# Patient Record
Sex: Male | Born: 1980
Health system: Southern US, Community
[De-identification: ages and names within clinical notes are randomized; demographics above are authoritative.]

## PROBLEM LIST (undated history)

## (undated) DIAGNOSIS — F329 Major depressive disorder, single episode, unspecified: Secondary | ICD-10-CM

## (undated) DIAGNOSIS — K3184 Gastroparesis: Secondary | ICD-10-CM

## (undated) DIAGNOSIS — F32A Depression, unspecified: Secondary | ICD-10-CM

## (undated) DIAGNOSIS — F419 Anxiety disorder, unspecified: Secondary | ICD-10-CM

## (undated) DIAGNOSIS — K219 Gastro-esophageal reflux disease without esophagitis: Secondary | ICD-10-CM

## (undated) HISTORY — DX: Major depressive disorder, single episode, unspecified: F32.9

## (undated) HISTORY — PX: OTHER SURGICAL HISTORY: SHX169

## (undated) HISTORY — DX: Anxiety disorder, unspecified: F41.9

## (undated) HISTORY — PX: ESOPHAGOGASTRODUODENOSCOPY: SHX1529

## (undated) HISTORY — DX: Depression, unspecified: F32.A

---

## 2006-06-05 ENCOUNTER — Emergency Department (HOSPITAL_COMMUNITY): Admission: EM | Admit: 2006-06-05 | Discharge: 2006-06-05 | Payer: Self-pay | Admitting: Emergency Medicine

## 2008-01-06 ENCOUNTER — Emergency Department (HOSPITAL_COMMUNITY): Admission: EM | Admit: 2008-01-06 | Discharge: 2008-01-06 | Payer: Self-pay | Admitting: Emergency Medicine

## 2011-05-05 ENCOUNTER — Encounter (HOSPITAL_COMMUNITY): Payer: Self-pay

## 2011-05-05 ENCOUNTER — Emergency Department (HOSPITAL_COMMUNITY): Payer: Self-pay

## 2011-05-05 ENCOUNTER — Emergency Department (HOSPITAL_COMMUNITY)
Admission: EM | Admit: 2011-05-05 | Discharge: 2011-05-05 | Disposition: A | Discharge status: Home / Self Care | Payer: Self-pay | Attending: Emergency Medicine | Admitting: Emergency Medicine

## 2011-05-05 DIAGNOSIS — M7989 Other specified soft tissue disorders: Secondary | ICD-10-CM | POA: Insufficient documentation

## 2011-05-05 DIAGNOSIS — S8990XA Unspecified injury of unspecified lower leg, initial encounter: Secondary | ICD-10-CM | POA: Insufficient documentation

## 2011-05-05 DIAGNOSIS — W219XXA Striking against or struck by unspecified sports equipment, initial encounter: Secondary | ICD-10-CM | POA: Insufficient documentation

## 2011-05-05 DIAGNOSIS — M25569 Pain in unspecified knee: Secondary | ICD-10-CM | POA: Insufficient documentation

## 2011-05-05 DIAGNOSIS — Y9361 Activity, american tackle football: Secondary | ICD-10-CM | POA: Insufficient documentation

## 2011-05-05 DIAGNOSIS — F17200 Nicotine dependence, unspecified, uncomplicated: Secondary | ICD-10-CM | POA: Insufficient documentation

## 2011-05-05 MED ORDER — IBUPROFEN 800 MG PO TABS
800.0000 mg | ORAL_TABLET | Freq: Once | ORAL | Status: AC
  Administered 2011-05-05: 800 mg via ORAL
  Filled 2011-05-05: qty 1

## 2011-05-05 NOTE — ED Provider Notes (Signed)
History     CSN: [id number]  Arrival date & time 05/05/11  1531   First MD Initiated Contact with Patient 05/05/11 1604      Chief Complaint  Patient presents with  . Knee Pain    (Consider location/radiation/quality/duration/timing/severity/associated sxs/prior treatment) HPI Comments: Patient presents with left knee pain that began today.  He notes that last Saturday he was in a football game and his knee was hit by a helmet.  At that time he didn't have significant pain and has been able to ambulate on his leg all week.  Starting yesterday he started having some increasing pain and swelling and it worsened today which is why he comes in.  He notes prior injury similar to this but has never had any orthopedic evaluation or prior procedures on that knee.  He has no fevers, redness, warmth or crepitus to the joint.  He has some worsening pain with flexion but he can flex and extend his knee joint.  Patient is a 31 y.o. male presenting with knee pain. The history is provided by the patient. No language interpreter was used.  Knee Pain This is a new problem. Pertinent negatives include no chest pain, no abdominal pain, no headaches and no shortness of breath. The symptoms are aggravated by bending.    History reviewed. No pertinent past medical history.  History reviewed. No pertinent past surgical history.  No family history on file.  History  Substance Use Topics  . Smoking status: Current Everyday Smoker -- 0.5 packs/day  . Smokeless tobacco: Not on file  . Alcohol Use: Yes      Review of Systems  Constitutional: Negative.  Negative for fever and chills.  HENT: Negative.   Eyes: Negative.  Negative for discharge and redness.  Respiratory: Negative.  Negative for cough and shortness of breath.   Cardiovascular: Negative.  Negative for chest pain.  Gastrointestinal: Negative.  Negative for nausea, vomiting and abdominal pain.  Genitourinary: Negative.  Negative for  hematuria.  Musculoskeletal: Positive for joint swelling. Negative for back pain.  Skin: Negative.  Negative for color change and rash.  Neurological: Negative for syncope and headaches.  Hematological: Negative.  Negative for adenopathy.  Psychiatric/Behavioral: Negative.  Negative for confusion.  All other systems reviewed and are negative.    Allergies  Review of patient's allergies indicates no known allergies.  Home Medications  No current outpatient prescriptions on file.  BP 149/89  Pulse 80  Temp(Src) 98.1 F (36.7 C) (Oral)  Resp 16  SpO2 98%  Physical Exam  Nursing note and vitals reviewed. Constitutional: He is oriented to person, place, and time. He appears well-developed and well-nourished.  Non-toxic appearance. He does not have a sickly appearance.  HENT:  Head: Normocephalic and atraumatic.  Eyes: Conjunctivae, EOM and lids are normal. Pupils are equal, round, and reactive to light.  Neck: Trachea normal, normal range of motion and full passive range of motion without pain. Neck supple.  Cardiovascular: Normal rate.   Pulmonary/Chest: Effort normal.  Abdominal: Soft. Normal appearance. There is no CVA tenderness.  Musculoskeletal: He exhibits edema.       Patient has mild swelling to the left knee.  No erythema, crepitus or warmth.  Patient can flex and extend his joint.  He does have pain with flexion and it is difficult to check for anterior or posterior drawer sign due to pain.  Neurological: He is alert and oriented to person, place, and time. He has normal strength.  Skin:  Skin is warm, dry and intact. No rash noted.  Psychiatric: He has a normal mood and affect. His behavior is normal. Judgment and thought content normal.    ED Course  Procedures (including critical care time)  Labs Reviewed - No data to display Dg Knee 2 Views Left  05/05/2011  *RADIOLOGY REPORT*  Clinical Data: Pain.  LEFT KNEE - 1-2 VIEW  Comparison: Four views left knee  01/06/2008.  Findings: No acute bony or joint abnormality is identified. Again seen is tricompartmental degenerative disease without marked change.  No joint effusion is seen.  IMPRESSION: No acute finding.  Tricompartmental degenerative disease.  Original Report Authenticated By: Bernadene Bell. Maricela Curet, M.D.     No diagnosis found.    MDM  Patient with possible soft tissue injury given the inherent in the knee with a helmet.  Patient also has degenerative disease and his knee despite his young age.  I will place him in a knee immobilizer and given crutches to be used as needed.  I will recommend orthopedic followup for further evaluation of this.  At this point in time I believe the patient can safely bear weight as tolerated.  He shows no signs of infection as there is no redness, warmth or crepitus at the wound site and the patient is without fevers        Nat Christen, MD 05/05/11 1623

## 2011-05-05 NOTE — ED Notes (Signed)
Pt presents with 6 day h/o L knee pain.  Pt reports during football practice, he was hit in knee with helmet, but reports swelling began yesterday.

## 2012-03-02 ENCOUNTER — Emergency Department (HOSPITAL_COMMUNITY)
Admission: EM | Admit: 2012-03-02 | Discharge: 2012-03-02 | Disposition: A | Discharge status: Home / Self Care | Payer: Self-pay | Attending: Emergency Medicine | Admitting: Emergency Medicine

## 2012-03-02 ENCOUNTER — Encounter (HOSPITAL_COMMUNITY): Payer: Self-pay | Admitting: *Deleted

## 2012-03-02 DIAGNOSIS — Z202 Contact with and (suspected) exposure to infections with a predominantly sexual mode of transmission: Secondary | ICD-10-CM | POA: Insufficient documentation

## 2012-03-02 DIAGNOSIS — F17200 Nicotine dependence, unspecified, uncomplicated: Secondary | ICD-10-CM | POA: Insufficient documentation

## 2012-03-02 MED ORDER — METRONIDAZOLE 500 MG PO TABS: 500.0000 mg | ORAL_TABLET | Freq: Two times a day (BID) | ORAL | Status: DC

## 2012-03-02 MED ORDER — AZITHROMYCIN 250 MG PO TABS
1000.0000 mg | ORAL_TABLET | Freq: Once | ORAL | Status: AC
  Administered 2012-03-02: 1000 mg via ORAL
  Filled 2012-03-02: qty 4

## 2012-03-02 MED ORDER — CEFTRIAXONE SODIUM 250 MG IJ SOLR
250.0000 mg | Freq: Once | INTRAMUSCULAR | Status: AC
  Administered 2012-03-02: 250 mg via INTRAMUSCULAR
  Filled 2012-03-02: qty 250

## 2012-03-02 MED ORDER — LIDOCAINE HCL (PF) 1 % IJ SOLN
INTRAMUSCULAR | Status: AC
  Administered 2012-03-02: 0.9 mL
  Filled 2012-03-02: qty 5

## 2012-03-02 NOTE — ED Provider Notes (Signed)
History   This chart was scribed for [employer]. Rubin Payor, MD, by Marcina Millard scribe. The patient was seen in room TR04C/TR04C and the patient's care was started at 1845.    CSN: [id number]  Arrival date & time 03/02/12  1831   First MD Initiated Contact with Patient 03/02/12 1845      Chief Complaint  Patient presents with  . Exposure to STD    (Consider location/radiation/quality/duration/timing/severity/associated sxs/prior treatment) HPI Comments: Preston [name] is a 31 y.o. male who presents to the Emergency Department complaining of exposure to an STD 3 days ago. He states that he had a new partner 3 days ago that called him today that she tested positive for trichomoniasis. He denies any discharge or burning. He has no h/o of STDs. He denies any allergies to medication.     History reviewed. No pertinent past medical history.  History reviewed. No pertinent past surgical history.  No family history on file.  History  Substance Use Topics  . Smoking status: Current Every Day Smoker -- 0.5 packs/day  . Smokeless tobacco: Not on file  . Alcohol Use: Yes      Review of Systems  All other systems reviewed and are negative.    Allergies  Red dye and Yellow dyes (non-tartrazine)  Home Medications   Current Outpatient Rx  Name  Route  Sig  Dispense  Refill  . IBUPROFEN 200 MG PO TABS   Oral   Take 400 mg by mouth every 6 (six) hours as needed. For pain         . METRONIDAZOLE 500 MG PO TABS   Oral   Take 1 tablet (500 mg total) by mouth 2 (two) times daily.   14 tablet   0     BP 125/72  Pulse 78  Temp 98.2 F (36.8 C)  Resp 18  SpO2 99%  Physical Exam  Nursing note and vitals reviewed. Constitutional: He is oriented to person, place, and time. He appears well-developed and well-nourished. No distress.  HENT:  Head: Normocephalic and atraumatic.  Eyes: EOM are normal.  Neck: No tracheal deviation present.  Pulmonary/Chest: No  respiratory distress.  Abdominal: He exhibits no distension.  Genitourinary: No penile tenderness.       No penile discharge  Musculoskeletal: Normal range of motion. He exhibits no edema.  Neurological: He is alert and oriented to person, place, and time.  Skin: Skin is warm and dry.  Psychiatric: He has a normal mood and affect. His behavior is normal.    ED Course  Procedures (including critical care time)   COORDINATION OF CARE:  18:50- Discussed planned course of treatment with the patient, including STD testing and antibiotics, who is agreeable at this time.  19:15- Medication Orders- ceftriaxone (ROCEPHIN) injection 250 mg- Once, anzithromycin (ZITHROMAX) tablet 1,000- Once.   Labs Reviewed  RPR  GC/CHLAMYDIA PROBE AMP  HIV ANTIBODY (ROUTINE TESTING)   No results found.   1. Exposure to STD       MDM  Patient was told by sexual partner that she has Trichomonas. Patient be treated and has been swabbed. Also HIV and RPR were sent.  I personally performed the services described in this documentation, which was scribed in my presence. The recorded information has been reviewed and is accurate.         Juliet Rude. Rubin Payor, MD 03/02/12 1928

## 2012-03-02 NOTE — ED Notes (Signed)
The pt had sex 3 days ago with a male that called him today and told him she was being treated for an std

## 2012-03-02 NOTE — ED Notes (Signed)
He has no symptoms 

## 2012-03-02 NOTE — ED Notes (Signed)
Patient is alert and orientedx4.  Patient was explained discharge instructions and he did not have any questions. 

## 2012-03-03 LAB — SYPHILIS: RPR W/REFLEX TO RPR TITER AND TREPONEMAL ANTIBODIES, TRADITIONAL SCREENING AND DIAGNOSIS ALGORITHM: RPR Ser Ql: NONREACTIVE

## 2012-03-03 LAB — HIV ANTIBODY (ROUTINE TESTING W REFLEX): HIV: NONREACTIVE

## 2012-03-03 LAB — GC/CHLAMYDIA PROBE AMP
CT Probe RNA: NEGATIVE
GC Probe RNA: NEGATIVE

## 2012-08-29 ENCOUNTER — Emergency Department (HOSPITAL_COMMUNITY): Payer: Self-pay

## 2012-08-29 ENCOUNTER — Encounter (HOSPITAL_COMMUNITY): Payer: Self-pay | Admitting: Emergency Medicine

## 2012-08-29 ENCOUNTER — Emergency Department (HOSPITAL_COMMUNITY)
Admission: EM | Admit: 2012-08-29 | Discharge: 2012-08-29 | Disposition: A | Discharge status: Home / Self Care | Payer: Self-pay | Attending: Emergency Medicine | Admitting: Emergency Medicine

## 2012-08-29 DIAGNOSIS — M25571 Pain in right ankle and joints of right foot: Secondary | ICD-10-CM

## 2012-08-29 DIAGNOSIS — M25473 Effusion, unspecified ankle: Secondary | ICD-10-CM | POA: Insufficient documentation

## 2012-08-29 DIAGNOSIS — Z87891 Personal history of nicotine dependence: Secondary | ICD-10-CM | POA: Insufficient documentation

## 2012-08-29 DIAGNOSIS — M7580 Other shoulder lesions, unspecified shoulder: Secondary | ICD-10-CM

## 2012-08-29 DIAGNOSIS — M25579 Pain in unspecified ankle and joints of unspecified foot: Secondary | ICD-10-CM | POA: Insufficient documentation

## 2012-08-29 DIAGNOSIS — M25476 Effusion, unspecified foot: Secondary | ICD-10-CM | POA: Insufficient documentation

## 2012-08-29 DIAGNOSIS — M7730 Calcaneal spur, unspecified foot: Secondary | ICD-10-CM | POA: Insufficient documentation

## 2012-08-29 NOTE — ED Notes (Signed)
Pt here for right ankle pain upon waking this am; pt sts no known injury; pt sts some swelling; pt sts old injury in leg

## 2012-08-29 NOTE — ED Notes (Signed)
Pt states he did not injure ankle, pain began after being at work.

## 2012-08-29 NOTE — ED Notes (Signed)
Erin PA at bedside 

## 2012-08-29 NOTE — ED Provider Notes (Signed)
History    This chart was scribed for non-physician practitioner Junius Finner working with Celene Kras, MD by Quintella Reichert, ED Scribe. This patient was seen in room TR09C/TR09C and the patient's care was started at 4:01 PM .   CSN: [id number]  Arrival date & time 08/29/12  1341       Chief Complaint  Patient presents with  . Ankle Pain     The history is provided by the patient. No language interpreter was used.    HPI Comments: Preston [name] is a 32 y.o. male who presents to the Emergency Department complaining of right ankle pain that began last night and became much more severe on waking this morning, with accompanying mild swelling.  Pt states he worked at his [employer] job yesterday but denies any recent injuries or any other unusual recent activities to bring on pain.  He rates pain as 9/10 and describes it as achy and sharp.  He states he took Aleve, which relieved pain somewhat.  Pt notes he sprained that ankle several years ago while playing football, and has had intermittent episodes of moderate pain since then.  Pt denies h/o gout or any other medical issues.   History reviewed. No pertinent past medical history.  History reviewed. No pertinent past surgical history.  History reviewed. No pertinent family history.  History  Substance Use Topics  . Smoking status: Former Smoker -- 0.50 packs/day  . Smokeless tobacco: Not on file  . Alcohol Use: Yes      Review of Systems  Musculoskeletal: Positive for joint swelling (Right ankle).  All other systems reviewed and are negative.    Allergies  Red dye and Yellow dyes (non-tartrazine)  Home Medications  No current outpatient prescriptions on file.  BP 158/82  Pulse 98  Temp(Src) 98.6 F (37 C) (Oral)  SpO2 96%  Physical Exam  Nursing note and vitals reviewed. Constitutional: He is oriented to person, place, and time. He appears well-developed and well-nourished. No distress.  HENT:  Head:  Normocephalic and atraumatic.  Eyes: Conjunctivae and EOM are normal. Pupils are equal, round, and reactive to light.  Neck: Normal range of motion. Neck supple. No tracheal deviation present.  Cardiovascular: Normal rate, regular rhythm and normal heart sounds.   No murmur heard. Pulmonary/Chest: Effort normal and breath sounds normal. No respiratory distress. He has no wheezes. He has no rales.  Abdominal: Soft. Bowel sounds are normal. There is no tenderness.  Musculoskeletal: Normal range of motion.  Mild swelling to lateral malleoli of right ankle, tender to palpation over lateral malleolus in dorsum of foot.  Pain with plantar flexion.  No erythema or warmth.    Neurological: He is alert and oriented to person, place, and time. Coordination normal.  Skin: Skin is warm and dry. No rash noted.  Skin intact.  Psychiatric: He has a normal mood and affect. His behavior is normal.    ED Course  Procedures (including critical care time)  DIAGNOSTIC STUDIES: Oxygen Saturation is 96% on room air, normal by my interpretation.    COORDINATION OF CARE: 4:06 PM-Explained that imaging rules out fracture but revealed presence of bone spurs in ankle.  Informed pt that pain is most likely due to bone spurs.  Discussed home treatment plan which includes pain medication with pt at bedside and pt agreed to plan. Pt accepts offer of a work note.  He declines pain medication in ED.     Labs Reviewed - No data to  display Dg Ankle Complete Right  08/29/2012   *RADIOLOGY REPORT*  Clinical Data: Right ankle pain and swelling  RIGHT ANKLE - COMPLETE 3+ VIEW  Comparison: None.  Findings: Three views of the right ankle submitted.  No acute fracture or subluxation.  Ankle mortise is preserved. There is spurring anterior aspect of the talus.  There is dorsal spurring and cortical irregularity superior aspect of the navicular.  This may be degenerative in nature or due to previous trauma.  Plantar spur of the  calcaneus.  IMPRESSION:  No acute fracture or subluxation.  Ankle mortise is preserved. There is spurring anterior aspect of the talus.  There is dorsal spurring and cortical irregularity superior aspect of the navicular.  This may be degenerative in nature or due to previous trauma.  Plantar spur of the calcaneus.   Original Report Authenticated By: Natasha Mead, M.D.     1. Right ankle pain   2. AC (acromioclavicular) joint bone spurs       MDM  Pt c/o right ankle pain that started this morning.  No known trauma.  Reports old football injury, sprain of same foot several years ago and has had intermittent pain since then.  PE: mild swelling of lateral malleolus and ttp. No erythema or warmth. Skin in tact. Plain film: no fx or subluxation, spurring of anterior aspect of talus, dorsal spurring and cortical irregularity superior aspect of navicular.  Discussed with pt, this spurring, likely due to his old football injury, is likely the cause of his pain.  Rx: ASO splint.  Pt stated he felt comfortable taking OTC antiinflammatories and has crutches at home. Provided reference for PCP referral and contact info for [facility] for further evaluation and management of bone spurts.   I personally performed the services described in this documentation, which was scribed in my presence. The recorded information has been reviewed and is accurate.     Junius Finner, PA-C 08/30/12 1313

## 2012-08-29 NOTE — ED Notes (Signed)
Pt states he began having pain to rt ankle around 3am this morning. Pt rates pain 10/10, pt states he took aleve, and put ice on ankle.

## 2012-08-29 NOTE — ED Notes (Signed)
Pt taken to xray 

## 2012-08-30 NOTE — ED Provider Notes (Signed)
Medical screening examination/treatment/procedure(s) were performed by non-physician practitioner and as supervising physician I was immediately available for consultation/collaboration.     Demetrick Eichenberger R Breya Cass, MD 08/30/12 1540 

## 2013-06-28 ENCOUNTER — Emergency Department (HOSPITAL_COMMUNITY)
Admission: EM | Admit: 2013-06-28 | Discharge: 2013-06-28 | Disposition: A | Discharge status: Home / Self Care | Payer: Self-pay | Attending: Emergency Medicine | Admitting: Emergency Medicine

## 2013-06-28 ENCOUNTER — Encounter (HOSPITAL_COMMUNITY): Payer: Self-pay | Admitting: Emergency Medicine

## 2013-06-28 ENCOUNTER — Emergency Department (HOSPITAL_COMMUNITY): Payer: Self-pay

## 2013-06-28 DIAGNOSIS — Y92838 Other recreation area as the place of occurrence of the external cause: Secondary | ICD-10-CM

## 2013-06-28 DIAGNOSIS — X58XXXA Exposure to other specified factors, initial encounter: Secondary | ICD-10-CM | POA: Insufficient documentation

## 2013-06-28 DIAGNOSIS — Z87891 Personal history of nicotine dependence: Secondary | ICD-10-CM | POA: Insufficient documentation

## 2013-06-28 DIAGNOSIS — Y9239 Other specified sports and athletic area as the place of occurrence of the external cause: Secondary | ICD-10-CM | POA: Insufficient documentation

## 2013-06-28 DIAGNOSIS — Y9367 Activity, basketball: Secondary | ICD-10-CM | POA: Insufficient documentation

## 2013-06-28 DIAGNOSIS — S93409A Sprain of unspecified ligament of unspecified ankle, initial encounter: Secondary | ICD-10-CM | POA: Insufficient documentation

## 2013-06-28 MED ORDER — IBUPROFEN 800 MG PO TABS: 800.0000 mg | ORAL_TABLET | Freq: Three times a day (TID) | ORAL | Status: DC

## 2013-06-28 NOTE — Discharge Instructions (Signed)

## 2013-06-28 NOTE — ED Provider Notes (Signed)
CSN: [id number]     Arrival date & time 06/28/13  1943 History  This chart was scribed for non-physician practitioner Roxy Horseman, PA-c working with Dagmar Hait, MD by Joaquin Music, ED Scribe. This patient was seen in room TR06C/TR06C and the patient's care was started at 8:08 PM .  Chief Complaint  Patient presents with  . Ankle Pain   The history is provided by the patient. No language interpreter was used.   HPI Comments: Preston [name] is a 33 y.o. male who presents to the Emergency Department complaining of ongoing worsening R ankle pain due to injury that occurred 4 day ago. Pt states he was playing basketball and suspects he may have injured his R ankle. He reports being able to walk without pain.    History reviewed. No pertinent past medical history. History reviewed. No pertinent past surgical history. No family history on file. History  Substance Use Topics  . Smoking status: Former Smoker -- 0.50 packs/day  . Smokeless tobacco: Not on file  . Alcohol Use: Yes    Review of Systems  Allergies  Red dye and Yellow dyes (non-tartrazine)  Home Medications  No current outpatient prescriptions on file. BP 129/79  Pulse 82  Temp(Src) 99 F (37.2 C) (Oral)  Resp 20  Ht 6\' 1"  (1.854 m)  Wt 316 lb 4 oz (143.45 kg)  BMI 41.73 kg/m2  SpO2 100%  Physical Exam  Nursing note and vitals reviewed. Constitutional: He is oriented to person, place, and time. He appears well-developed and well-nourished. No distress.  HENT:  Head: Normocephalic and atraumatic.  Eyes: EOM are normal.  Neck: Neck supple. No tracheal deviation present.  Cardiovascular: Normal rate and intact distal pulses.   Pulmonary/Chest: Effort normal. No respiratory distress.  Musculoskeletal: Normal range of motion.  R ankle mildly tender to palpation over the medial and lateral aspects. Mildly swollen. ROM and strength minimally reduced secondary to pain.  Neurological: He is  alert and oriented to person, place, and time.  Sensation intact.  Skin: Skin is warm and dry.  Psychiatric: He has a normal mood and affect. His behavior is normal.    ED Course  Procedures  DIAGNOSTIC STUDIES: Oxygen Saturation is 100% on RA, normal by my interpretation.    COORDINATION OF CARE: 8:10 PM-Discussed treatment plan which includes R ankle X-ray. Informed pt possibility of ankle brace but will wait for X-ray to determine. Pt agreed to plan.   9:02 PM-Informed pt of radiology findings. Will discharge pt with Ibuprofen and advised pt to F/U with Ortho. Pt agreed to plan.  Labs Review Labs Reviewed - No data to display Imaging Review Dg Ankle Complete Right  06/28/2013   CLINICAL DATA:  Pain post trauma  EXAM: RIGHT ANKLE - COMPLETE 3+ VIEW  COMPARISON:  Aug 29, 2012  FINDINGS: Frontal, oblique, and lateral views were obtained. There is no fracture or effusion. Ankle mortise appears intact. There is narrowing medially. There is osteoarthritic change in the talonavicular joint region as well as bony overgrowth posterior to the talus, stable. There is a stable inferior calcaneal spur. No erosive change.  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 No fracture. Mortise intact.   Electronically Signed   By: Bretta Bang M.D.   On: 06/28/2013 20:44     EKG Interpretation None     MDM   Final diagnoses:  Ankle sprain    Ankle sprain.  RICE.  Brace and crutches.  Ortho follow-up.  I personally performed the services described in this documentation, which was scribed in my presence. The recorded information has been reviewed and is accurate.     Roxy Horsemanobert Cove Haydon, PA-C 06/28/13 2158

## 2013-06-28 NOTE — ED Notes (Signed)
Pt. Injured his right ankle while playing basketball last Tuesday , ambulatory / mild pain .

## 2013-06-28 NOTE — ED Provider Notes (Signed)
Medical screening examination/treatment/procedure(s) were performed by non-physician practitioner and as supervising physician I was immediately available for consultation/collaboration.   EKG Interpretation None        Dagmar HaitWilliam Tomasz Steeves, MD 06/28/13 2310

## 2013-06-28 NOTE — Progress Notes (Signed)
Orthopedic Tech Progress Note Patient Details:  Preston [name] 01-24-1981 [id number] Patient has own crutches Ortho Devices Type of Ortho Device: Crutches Ortho Device/Splint Location: RLE Ortho Device/Splint Interventions: Ordered;Application   Jennye MoccasinHughes, Jurline Folger Craig 06/28/2013, 9:23 PM

## 2014-09-16 ENCOUNTER — Emergency Department (HOSPITAL_COMMUNITY): Payer: Self-pay

## 2014-09-16 ENCOUNTER — Emergency Department (HOSPITAL_COMMUNITY)
Admission: EM | Admit: 2014-09-16 | Discharge: 2014-09-16 | Disposition: A | Discharge status: Home / Self Care | Payer: Self-pay | Attending: Emergency Medicine | Admitting: Emergency Medicine

## 2014-09-16 ENCOUNTER — Encounter (HOSPITAL_COMMUNITY): Payer: Self-pay | Admitting: Emergency Medicine

## 2014-09-16 DIAGNOSIS — R079 Chest pain, unspecified: Secondary | ICD-10-CM | POA: Insufficient documentation

## 2014-09-16 DIAGNOSIS — M791 Myalgia: Secondary | ICD-10-CM | POA: Insufficient documentation

## 2014-09-16 DIAGNOSIS — R5383 Other fatigue: Secondary | ICD-10-CM | POA: Insufficient documentation

## 2014-09-16 DIAGNOSIS — Z87891 Personal history of nicotine dependence: Secondary | ICD-10-CM | POA: Insufficient documentation

## 2014-09-16 DIAGNOSIS — Z791 Long term (current) use of non-steroidal anti-inflammatories (NSAID): Secondary | ICD-10-CM | POA: Insufficient documentation

## 2014-09-16 LAB — BASIC METABOLIC PANEL
ANION GAP: 7 (ref 5–15)
BUN: 11 mg/dL (ref 6–20)
CHLORIDE: 103 mmol/L (ref 101–111)
CO2: 28 mmol/L (ref 22–32)
Calcium: 9.6 mg/dL (ref 8.9–10.3)
Creatinine, Ser: 1 mg/dL (ref 0.61–1.24)
GFR calc non Af Amer: >60 mL/min (ref >60)
Glucose, Bld: 108 mg/dL — ABNORMAL HIGH (ref 65–99)
POTASSIUM: 3.9 mmol/L (ref 3.5–5.1)
Sodium: 138 mmol/L (ref 135–145)

## 2014-09-16 LAB — CBC
HEMATOCRIT: 45.2 % (ref 39.0–52.0)
Hemoglobin: 15.7 g/dL (ref 13.0–17.0)
MCH: 30.2 pg (ref 26.0–34.0)
MCHC: 34.7 g/dL (ref 30.0–36.0)
MCV: 86.9 fL (ref 78.0–100.0)
PLATELETS: 296 10*3/uL (ref 150–400)
RBC: 5.2 MIL/uL (ref 4.22–5.81)
RDW: 13.2 % (ref 11.5–15.5)
WBC: 9.8 10*3/uL (ref 4.0–10.5)

## 2014-09-16 LAB — I-STAT TROPONIN, ED: TROPONIN I, POC: 0 ng/mL (ref 0.00–0.08)

## 2014-09-16 LAB — CBG MONITORING, ED: Glucose-Capillary: 97 mg/dL (ref 65–99)

## 2014-09-16 NOTE — ED Provider Notes (Signed)
CSN: [id number]     Arrival date & time 09/16/14  1518 History   First MD Initiated Contact with Patient 09/16/14 1716     Chief Complaint  Patient presents with  . Generalized Body Aches    (Consider location/radiation/quality/duration/timing/severity/associated sxs/prior Treatment) HPI Comments: Patient with history of smoking, obesity presents with complaint of chest pain and generalized body pain starting acutely while watching TV and preparing food today. Patient states that he had acute onset of left upper lateral chest pain between 2-3pm today. Symptoms lasted for approximately 1 hour. Pain then generalized to his entire body. He states he felt weak. No lightheadedness, palpitations, diaphoresis, nausea or vomiting. Patient with no recent chest pain with activity or exertion. Patient does not have a known history of hypertension, hypercholesterolemia, diabetes but states that he does not see a doctor regularly. No family history of CAD in first-degree relatives. No early cardiac death at young age in the family. Patient denies risk factors for pulmonary embolism including: unilateral leg swelling, history of DVT/PE/other blood clots, use of estrogens, recent immobilizations, recent surgery, recent travel (>4hr segment), malignancy, hemoptysis.   The history is provided by the patient and medical records.    History reviewed. No pertinent past medical history. History reviewed. No pertinent past surgical history. No family history on file. History  Substance Use Topics  . Smoking status: Former Smoker -- 0.50 packs/day  . Smokeless tobacco: Not on file  . Alcohol Use: Yes    Review of Systems  Constitutional: Positive for fatigue. Negative for fever and diaphoresis.  Eyes: Negative for redness.  Respiratory: Negative for cough and shortness of breath.   Cardiovascular: Positive for chest pain. Negative for palpitations and leg swelling.  Gastrointestinal: Negative for nausea,  vomiting and abdominal pain.  Genitourinary: Negative for dysuria.  Musculoskeletal: Positive for myalgias. Negative for back pain and neck pain.  Skin: Negative for rash.  Neurological: Negative for syncope and light-headedness.  Psychiatric/Behavioral: The patient is not nervous/anxious.    Allergies  Red dye and Yellow dyes (non-tartrazine)  Home Medications   Prior to Admission medications   Medication Sig Start Date End Date Taking? Authorizing Provider  ibuprofen (ADVIL,MOTRIN) 200 MG tablet Take 600 mg by mouth every 6 (six) hours as needed for mild pain.    Historical Provider, MD  ibuprofen (ADVIL,MOTRIN) 800 MG tablet Take 1 tablet (800 mg total) by mouth 3 (three) times daily. 06/28/13   Roxy Horseman, PA-C   BP 136/92 mmHg  Pulse 78  Temp(Src) 98.6 F (37 C) (Oral)  Resp 19  SpO2 97%   Physical Exam  Constitutional: He appears well-developed and well-nourished.  HENT:  Head: Normocephalic and atraumatic.  Mouth/Throat: Mucous membranes are normal. Mucous membranes are not dry.  Eyes: Conjunctivae are normal.  Neck: Trachea normal and normal range of motion. Neck supple. Normal carotid pulses and no JVD present. No muscular tenderness present. Carotid bruit is not present. No tracheal deviation present.  Cardiovascular: Normal rate, regular rhythm, S1 normal, S2 normal, normal heart sounds and intact distal pulses.  Exam reveals no distant heart sounds and no decreased pulses.   No murmur heard. Pulmonary/Chest: Effort normal and breath sounds normal. No respiratory distress. He has no wheezes. He exhibits no tenderness.  Abdominal: Soft. Normal aorta and bowel sounds are normal. There is no tenderness. There is no rebound and no guarding.  Musculoskeletal: He exhibits no edema.  Neurological: He is alert.  Skin: Skin is warm and dry. He is  not diaphoretic. No cyanosis. No pallor.  Psychiatric: He has a normal mood and affect.  Nursing note and vitals  reviewed.   ED Course  Procedures (including critical care time) Labs Review Labs Reviewed  BASIC METABOLIC PANEL - Abnormal; Notable for the following:    Glucose, Bld 108 (*)    All other components within normal limits  CBC  CBG MONITORING, ED  Rosezena SensorI-STAT TROPOININ, ED    Imaging Review Dg Chest 2 View  09/16/2014   CLINICAL DATA:  Left chest pain today.  EXAM: CHEST  2 VIEW  COMPARISON:  None.  FINDINGS: The cardiomediastinal silhouette is unremarkable.  There is no evidence of focal airspace disease, pulmonary edema, suspicious pulmonary nodule/mass, pleural effusion, or pneumothorax. No acute bony abnormalities are identified.  IMPRESSION: No active cardiopulmonary disease.   Electronically Signed   By: Harmon PierJeffrey  Hu M.D.   On: 09/16/2014 18:20    ED ECG REPORT   Date: 09/16/2014  Rate: 74  Rhythm: normal sinus rhythm  QRS Axis: normal  Intervals: normal  ST/T Wave abnormalities: normal  Conduction Disutrbances:none  Narrative Interpretation: q-waves noted  Old EKG Reviewed: none available  I have personally reviewed the EKG tracing and agree with the computerized printout as noted.   5:34 PM Patient seen and examined. Work-up initiated. Medications ordered. EKG reviewed. Does have some q-waves otherwise nml, no old for comparison. Given unknown risk factor profile will check troponin and CXR. If neg, suspect d/c with PCP f/u. Pt low-risk for ACS.   Vital signs reviewed and are as follows: BP 136/92 mmHg  Pulse 78  Temp(Src) 98.6 F (37 C) (Oral)  Resp 19  SpO2 97%  7:28 PM troponin is 4 hours after the episode. Chest x-ray is negative. Patient is currently asymptomatic. Will discharge to home with PCP follow-up. PCP referral given. Encouraged pt to rest and hydrate well.   Patient was counseled to return with severe chest pain, especially if the pain is crushing or pressure-like and spreads to the arms, back, neck, or jaw, or if they have sweating, nausea, or shortness  of breath with the pain. They were encouraged to call 911 with these symptoms.   They were also told to return if their chest pain gets worse and does not go away with rest, they have an attack of chest pain lasting longer than usual despite rest and treatment with the medications their caregiver has prescribed, if they wake from sleep with chest pain or shortness of breath, if they feel dizzy or faint, if they have chest pain not typical of their usual pain, or if they have any other emergent concerns regarding their health.  The patient verbalized understanding and agreed.   BP 132/79 mmHg  Pulse 77  Temp(Src) 98.6 F (37 C) (Oral)  Resp 14  SpO2 97%   MDM   Final diagnoses:  Chest pain, unspecified chest pain type   Patient with chest tightness that generalized to full-body aches x 1 hr before resolving spontaneously. Feel patient is low risk for ACS given history (poor story for ACS/MI), negative troponin(s), non-acute EKG. PERC neg. Work-up reassuring. HEART=1. Feel PCP f/u indicated with return if worsening.  No dangerous or life-threatening conditions suspected or identified by history, physical exam, and by work-up. No indications for hospitalization identified.     Renne CriglerJoshua Arriyah Madej, PA-C 09/16/14 1930  Rolan BuccoMelanie Belfi, MD 09/16/14 [phone]

## 2014-09-16 NOTE — Discharge Instructions (Signed)
Please read and follow all provided instructions.  Your diagnoses today include:  1. Chest pain, unspecified chest pain type    Tests performed today include:  An EKG of your heart  A chest x-ray  Cardiac enzymes - a blood test for heart muscle damage  Blood counts and electrolytes  Vital signs. See below for your results today.   Medications prescribed:   None  Take any prescribed medications only as directed.  Follow-up instructions: Please follow-up with your primary care provider as soon as you can for further evaluation of your symptoms.   Return instructions:  SEEK IMMEDIATE MEDICAL ATTENTION IF:  You have severe chest pain, especially if the pain is crushing or pressure-like and spreads to the arms, back, neck, or jaw, or if you have sweating, nausea (feeling sick to your stomach), or shortness of breath. THIS IS AN EMERGENCY. Don't wait to see if the pain will go away. Get medical help at once. Call 911 or 0 (operator). DO NOT drive yourself to the hospital.   Your chest pain gets worse and does not go away with rest.   You have an attack of chest pain lasting longer than usual, despite rest and treatment with the medications your caregiver has prescribed.   You wake from sleep with chest pain or shortness of breath.  You feel dizzy or faint.  You have chest pain not typical of your usual pain for which you originally saw your caregiver.   You have any other emergent concerns regarding your health.  Additional Information: Chest pain comes from many different causes. Your caregiver has diagnosed you as having chest pain that is not specific for one problem, but does not require admission.  You are at low risk for an acute heart condition or other serious illness.   Your vital signs today were: BP 121/82 mmHg   Pulse 73   Temp(Src) 98.6 F (37 C) (Oral)   Resp 18   SpO2 99% If your blood pressure (BP) was elevated above 135/85 this visit, please have this  repeated by your doctor within one month. --------------

## 2014-09-16 NOTE — Progress Notes (Signed)
EDCM spoke to patient at bedside. Patient confirms he does not have a pcp or insurance living in [place].  EDCM provided patient with pamphlet to [facility], informed patient of services there and walk in times.  EDCM also provided patient with list of pcps who accept self pay patients, list of discount pharmacies and websites needymeds.org and GoodRX.com for medication assistance, phone number to inquire about the orange card, phone number to inquire about Mediciad, phone number to inquire about the Affordable Care Act, financial resources in the community such as local churches, salvation army, urban ministries, and dental assistance for uninsured patients.  Patient thankful for resources.  No further EDCM needs at this time. 

## 2014-09-16 NOTE — ED Notes (Signed)
Per pt, states he feels weak and numb-happened when he was watching TV

## 2014-09-17 ENCOUNTER — Telehealth: Payer: Self-pay

## 2014-09-17 ENCOUNTER — Encounter (HOSPITAL_COMMUNITY): Payer: Self-pay | Admitting: Emergency Medicine

## 2014-09-17 ENCOUNTER — Emergency Department (HOSPITAL_COMMUNITY)
Admission: EM | Admit: 2014-09-17 | Discharge: 2014-09-17 | Disposition: A | Discharge status: Home / Self Care | Payer: Self-pay | Attending: Emergency Medicine | Admitting: Emergency Medicine

## 2014-09-17 ENCOUNTER — Emergency Department (HOSPITAL_COMMUNITY)
Admission: EM | Admit: 2014-09-17 | Discharge: 2014-09-18 | Disposition: A | Discharge status: Home / Self Care | Payer: Self-pay | Attending: Emergency Medicine | Admitting: Emergency Medicine

## 2014-09-17 ENCOUNTER — Encounter (HOSPITAL_COMMUNITY): Payer: Self-pay

## 2014-09-17 DIAGNOSIS — R0789 Other chest pain: Secondary | ICD-10-CM | POA: Insufficient documentation

## 2014-09-17 DIAGNOSIS — Z87891 Personal history of nicotine dependence: Secondary | ICD-10-CM | POA: Insufficient documentation

## 2014-09-17 LAB — RAPID URINE DRUG SCREEN, HOSP PERFORMED
Amphetamines: NOT DETECTED
Barbiturates: NOT DETECTED
Benzodiazepines: NOT DETECTED
Cocaine: NOT DETECTED
OPIATES: NOT DETECTED
Tetrahydrocannabinol: POSITIVE — AB

## 2014-09-17 LAB — TROPONIN I: Troponin I: <0.03 ng/mL (ref <0.031)

## 2014-09-17 MED ORDER — GI COCKTAIL ~~LOC~~
30.0000 mL | Freq: Once | ORAL | Status: AC
  Administered 2014-09-17: 30 mL via ORAL
  Filled 2014-09-17: qty 30

## 2014-09-17 MED ORDER — FAMOTIDINE 20 MG PO TABS
20.0000 mg | ORAL_TABLET | Freq: Once | ORAL | Status: AC
  Administered 2014-09-17: 20 mg via ORAL
  Filled 2014-09-17: qty 1

## 2014-09-17 NOTE — ED Notes (Signed)
Bed: WA03 Expected date:  Expected time:  Means of arrival:  Comments: 

## 2014-09-17 NOTE — Telephone Encounter (Signed)
New Message   Un established patient called states that they were [place] [facility]. Pt currently having Chest pain, Dry mouth, feeling faint, weak and cold chills. Spoke with a nurse and she states that if the pt is currently having chest pains he will need to go back to the ED as advised per discharge. If pt refuses to go to the ED was recommended to go to urgent care or PCP. Strongly advised going back to ED.     Advised friend Juliette AlcideMelinda that phoned in regarding patient, verbalized understanding.

## 2014-09-17 NOTE — Progress Notes (Signed)
34 yr old male seen in ED on 09/16/14 also c/o chest pain, faint and weakness [facility] PM CM on 09/16/14 and given self pay resources Pt states he did not have anyone with him on 09/16/14 and understood some of resources but not all. Today pt has a male with him at bedside who has the copy of the self pay resources in her hand and is making notes as CM re reviewed resources CM spoke with pt who [place].  CM discussed and provided written information for self pay pcps, discussed the importance of pcp vs EDP services for f/u care, www.needymeds.org, www.goodrx.com, discounted pharmacies and other [place] resources such as [facility] , [facility], affordable care act,  [place] med assist, financial assistance, self pay dental services, [place] med assist, DSS and  health department  Reviewed resources for [place] self pay pcps like [facility], family medicine at [address], [facility], [facility], local urgent care centers, [facility], [facility], general medical clinics, [facility] services of [facility], [facility] urgent care plus others, medication resources, [facility] out patient pharmacies and housing Pt voiced understanding and appreciation of resources provided  Encouraged use of [facility], affordable care act, goodrx & [place] med assist  Provided [facility] contact information Encouraged to contact agency after he has been a resident for 6 months or more. Pt has not been no in [place] for more than 6 months  He informed CM that [address] [place] [id number] Is his permanent address and where "I receive my mail"  But [place] is in [place] now   CM offered pt a list of [place] self pay clinics also including [facility] [place] [place] [phone]  [phone] [facility] [place]: [place], [place]- [id number]  [phone] [facility]  [place]: [place], [place]- [id number]  [phone] [facility] [place] Location: [address] [place], [place]- [id number] [phone] and  [facility] [place] Location: [address] [place], [place]- [phone]

## 2014-09-17 NOTE — ED Provider Notes (Signed)
CSN: [id number]     Arrival date & time 09/17/14  1048 History   First MD Initiated Contact with Patient 09/17/14 1113     Chief Complaint  Patient presents with  . Chest Pain    started yesterday, seen yesterday for same     (Consider location/radiation/quality/duration/timing/severity/associated sxs/prior Treatment) Patient is a 34 y.o. male presenting with chest pain. The history is provided by the patient.  Chest Pain Associated symptoms: no abdominal pain, no back pain, no fever, no headache, no nausea, no palpitations, no shortness of breath and not vomiting   Patient c/o mid to left chest pain, onset yesterday at rest. Had lasted a few hours, resolved. Last night, at rest, pain returned. Constant. States feels as if bubbling or gas. Denies heartburn. Denies hx gerd. No recent exertional cp or discomfort. No associated nv, diaphoresis or sob. Pain is not pleuritic. Not constant. No cough or uri c/o. No fever or chills. No fam hx premature cad or early sudden death. +smopker, no drug use. States after symptoms, then got numb/tingling feeling in bilateral hands/feets ?hyperventilation. Denies leg pain or swelling. No recent immobility, surgery, trauma or travel. No hemoptysis, sob or coughing. No hx dvt or pe. No recent chest wall injury or strain. No back pain. No rash/swelling to area of pain.     History reviewed. No pertinent past medical history. History reviewed. No pertinent past surgical history. History reviewed. No pertinent family history. History  Substance Use Topics  . Smoking status: Former Smoker -- 0.50 packs/day    Types: Cigars  . Smokeless tobacco: Not on file  . Alcohol Use: Yes     Comment: social    Review of Systems  Constitutional: Negative for fever and chills.  HENT: Negative for sore throat.   Eyes: Negative for redness.  Respiratory: Negative for shortness of breath.   Cardiovascular: Positive for chest pain. Negative for palpitations and leg  swelling.  Gastrointestinal: Negative for nausea, vomiting and abdominal pain.  Genitourinary: Negative for flank pain.  Musculoskeletal: Negative for back pain and neck pain.  Skin: Negative for rash.  Neurological: Negative for syncope and headaches.  Hematological: Does not bruise/bleed easily.  Psychiatric/Behavioral: Negative for confusion.      Allergies  Red dye and Yellow dyes (non-tartrazine)  Home Medications   Prior to Admission medications   Medication Sig Start Date End Date Taking? Authorizing Provider  Aspirin-Salicylamide-Caffeine (BC HEADACHE POWDER PO) Take 1 packet by mouth daily as needed (ankle pain).    Historical Provider, MD  ibuprofen (ADVIL,MOTRIN) 800 MG tablet Take 1 tablet (800 mg total) by mouth 3 (three) times daily. Patient not taking: Reported on 09/16/2014 06/28/13   Roxy Horseman, PA-C   BP 126/79 mmHg  Pulse 72  Temp(Src) 98.1 F (36.7 C) (Oral)  Resp 18  Ht  (1.956 m)  SpO2 98% Physical Exam  Constitutional: He is oriented to person, place, and time. He appears well-developed and well-nourished. No distress.  HENT:  Head: Atraumatic.  Eyes: Conjunctivae are normal. No scleral icterus.  Neck: Neck supple. No tracheal deviation present.  Cardiovascular: Normal rate, regular rhythm, normal heart sounds and intact distal pulses.  Exam reveals no gallop and no friction rub.   No murmur heard. Pulmonary/Chest: Effort normal and breath sounds normal. No accessory muscle usage. No respiratory distress. He exhibits no tenderness.  Abdominal: Soft. Bowel sounds are normal. He exhibits no distension. There is no tenderness.  Musculoskeletal: Normal range of motion. He exhibits no  edema or tenderness.  t spine non tender.   Neurological: He is alert and oriented to person, place, and time.  Skin: Skin is warm and dry. No rash noted. He is not diaphoretic.  Psychiatric: He has a normal mood and affect.  Nursing note and vitals  reviewed.   ED Course  Procedures (including critical care time) Labs Review  Results for orders placed or performed during the hospital encounter of 09/17/14  Urine rapid drug screen (hosp performed)not at [facility]  Result Value Ref Range   Opiates NONE DETECTED NONE DETECTED   Cocaine NONE DETECTED NONE DETECTED   Benzodiazepines NONE DETECTED NONE DETECTED   Amphetamines NONE DETECTED NONE DETECTED   Tetrahydrocannabinol POSITIVE (A) NONE DETECTED   Barbiturates NONE DETECTED NONE DETECTED  Troponin I  Result Value Ref Range   Troponin I <0.03 <0.031 ng/mL   Dg Chest 2 View  09/16/2014   CLINICAL DATA:  Left chest pain today.  EXAM: CHEST  2 VIEW  COMPARISON:  None.  FINDINGS: The cardiomediastinal silhouette is unremarkable.  There is no evidence of focal airspace disease, pulmonary edema, suspicious pulmonary nodule/mass, pleural effusion, or pneumothorax. No acute bony abnormalities are identified.  IMPRESSION: No active cardiopulmonary disease.   Electronically Signed   By: Harmon PierJeffrey  Hu M.D.   On: 09/16/2014 18:20         EKG Interpretation   Date/Time:  Wednesday September 17 2014 10:58:03 EDT Ventricular Rate:  62 PR Interval:  158 QRS Duration: 103 QT Interval:  416 QTC Calculation: 422 R Axis:   51 Text Interpretation:  Sinus rhythm Baseline wander in lead(s) V3 V4 V5 No  significant change since last tracing Confirmed by Denton LankSTEINL  MD, Caryn BeeKEVIN  ([id number]) on 09/17/2014 11:15:49 AM      MDM   Ecg. Labs.  Reviewed nursing notes and prior charts for additional history.   From yesterday, trop 0. cxr neg.  After symptoms persistent for 24+ hrs, trop neg.  No cough or hemoptysis, hr 66, rr 14, pulse ox 100% room air, no pleuritic pain.   Pt currently appears stable for d/c.   rec close pcp f/u.  Return precautions provided.     Cathren LaineKevin Shavell Nored, MD 09/17/14 1350

## 2014-09-17 NOTE — Discharge Instructions (Signed)
It was our pleasure to provide your ER care today - we hope that you feel better.  If gi symptoms/heartburn, you may try pepcid and maalox as need for symptom relief.  Follow up with primary care doctor in coming week - see referral  - call to arrange appointment.  Return to ER if worse, new symptoms, fevers, trouble breathing, persistent/recurrent chest pain, medical emergency, other concern.    Chest Pain (Nonspecific) It is often hard to give a specific diagnosis for the cause of chest pain. There is always a chance that your pain could be related to something serious, such as a heart attack or a blood clot in the lungs. You need to follow up with your health care provider for further evaluation. CAUSES   Heartburn.  Pneumonia or bronchitis.  Anxiety or stress.  Inflammation around your heart (pericarditis) or lung (pleuritis or pleurisy).  A blood clot in the lung.  A collapsed lung (pneumothorax). It can develop suddenly on its own (spontaneous pneumothorax) or from trauma to the chest.  Shingles infection (herpes zoster virus). The chest wall is composed of bones, muscles, and cartilage. Any of these can be the source of the pain.  The bones can be bruised by injury.  The muscles or cartilage can be strained by coughing or overwork.  The cartilage can be affected by inflammation and become sore (costochondritis). DIAGNOSIS  Lab tests or other studies may be needed to find the cause of your pain. Your health care provider may have you take a test called an ambulatory electrocardiogram (ECG). An ECG records your heartbeat patterns over a 24-hour period. You may also have other tests, such as:  Transthoracic echocardiogram (TTE). During echocardiography, sound waves are used to evaluate how blood flows through your heart.  Transesophageal echocardiogram (TEE).  Cardiac monitoring. This allows your health care provider to monitor your heart rate and rhythm in real  time.  Holter monitor. This is a portable device that records your heartbeat and can help diagnose heart arrhythmias. It allows your health care provider to track your heart activity for several days, if needed.  Stress tests by exercise or by giving medicine that makes the heart beat faster. TREATMENT   Treatment depends on what may be causing your chest pain. Treatment may include:  Acid blockers for heartburn.  Anti-inflammatory medicine.  Pain medicine for inflammatory conditions.  Antibiotics if an infection is present.  You may be advised to change lifestyle habits. This includes stopping smoking and avoiding alcohol, caffeine, and chocolate.  You may be advised to keep your head raised (elevated) when sleeping. This reduces the chance of acid going backward from your stomach into your esophagus. Most of the time, nonspecific chest pain will improve within 2-3 days with rest and mild pain medicine.  HOME CARE INSTRUCTIONS   If antibiotics were prescribed, take them as directed. Finish them even if you start to feel better.  For the next few days, avoid physical activities that bring on chest pain. Continue physical activities as directed.  Do not use any tobacco products, including cigarettes, chewing tobacco, or electronic cigarettes.  Avoid drinking alcohol.  Only take medicine as directed by your health care provider.  Follow your health care provider's suggestions for further testing if your chest pain does not go away.  Keep any follow-up appointments you made. If you do not go to an appointment, you could develop lasting (chronic) problems with pain. If there is any problem keeping an appointment, call  to reschedule. SEEK MEDICAL CARE IF:   Your chest pain does not go away, even after treatment.  You have a rash with blisters on your chest.  You have a fever. SEEK IMMEDIATE MEDICAL CARE IF:   You have increased chest pain or pain that spreads to your arm,  neck, jaw, back, or abdomen.  You have shortness of breath.  You have an increasing cough, or you cough up blood.  You have severe back or abdominal pain.  You feel nauseous or vomit.  You have severe weakness.  You faint.  You have chills. This is an emergency. Do not wait to see if the pain will go away. Get medical help at once. Call your local emergency services (911 in [place] Do not drive yourself to the hospital. MAKE SURE YOU:   Understand these instructions.  Will watch your condition.  Will get help right away if you are not doing well or get worse. Document Released: 01/12/2005 Document Revised: 04/09/2013 Document Reviewed: 11/08/2007 [facility] Patient Information 2015 [place], [place]. This information is not intended to replace advice given to you by your health care provider. Make sure you discuss any questions you have with your health care provider.

## 2014-09-17 NOTE — ED Notes (Signed)
This is patients third visit in 24 hours for the same, he continues to complain of chest pressure and blurry vision, pt states that he felt better after leaving here earlier, but now the sx are back, he states it seems to be worse at night

## 2014-09-17 NOTE — ED Provider Notes (Signed)
CSN: [id number]     Arrival date & time 09/17/14  2239 History   First MD Initiated Contact with Patient 09/17/14 2359     Chief Complaint  Patient presents with  . Chest Pain     (Consider location/radiation/quality/duration/timing/severity/associated sxs/prior Treatment) HPI Comments: Patient is a 34 year old male presenting to the ED for his third visit for intermittent left-sided chest pain. Patient states his pain was much improved after receiving a GI cocktail in the ER last evening at home and did not pick up any Maalox or other over-the-counter medications as advised. No recent exertional cp or discomfort. No associated nv, diaphoresis or sob. Pain is not pleuritic. Not constant. No cough or uri c/o. No fever or chills. No fam hx premature cad or early sudden death. Denies leg pain or swelling. No recent immobility, surgery, trauma or travel. No hemoptysis, sob or coughing. No hx dvt or pe. No recent chest wall injury or strain. No back pain. No rash/swelling to area of pain.  PERC negative.   Patient is a 34 y.o. male presenting with chest pain.  Chest Pain   History reviewed. No pertinent past medical history. History reviewed. No pertinent past surgical history. History reviewed. No pertinent family history. History  Substance Use Topics  . Smoking status: Former Smoker -- 0.50 packs/day    Types: Cigars  . Smokeless tobacco: Not on file  . Alcohol Use: Yes     Comment: social    Review of Systems  Cardiovascular: Positive for chest pain.  All other systems reviewed and are negative.     Allergies  Red dye and Yellow dyes (non-tartrazine)  Home Medications   Prior to Admission medications   Medication Sig Start Date End Date Taking? Authorizing Provider  alum & mag hydroxide-simeth (MAALOX ADVANCED) 200-200-20 MG/5ML suspension Take 15 mLs by mouth every 6 (six) hours as needed for indigestion or heartburn. 09/18/14   Naomii Kreger, PA-C   Aspirin-Salicylamide-Caffeine (BC HEADACHE POWDER PO) Take 1 packet by mouth daily as needed (ankle pain).    Historical Provider, MD  ibuprofen (ADVIL,MOTRIN) 800 MG tablet Take 1 tablet (800 mg total) by mouth 3 (three) times daily. Patient not taking: Reported on 09/16/2014 06/28/13   Roxy Horsemanobert Browning, PA-C  loratadine (CLARITIN) 10 MG tablet Take 10 mg by mouth daily as needed for allergies.    Historical Provider, MD   BP 118/78 mmHg  Pulse 59  Temp(Src) 98.1 F (36.7 C) (Oral)  Resp 11  SpO2 100% Physical Exam  Constitutional: He is oriented to person, place, and time. He appears well-developed and well-nourished. No distress.  HENT:  Head: Normocephalic and atraumatic.  Right Ear: External ear normal.  Left Ear: External ear normal.  Nose: Nose normal.  Mouth/Throat: Oropharynx is clear and moist.  Eyes: Conjunctivae are normal.  Neck: Normal range of motion. Neck supple.  No nuchal rigidity.   Cardiovascular: Normal rate, regular rhythm and normal heart sounds.   Pulmonary/Chest: Effort normal and breath sounds normal. No respiratory distress. He exhibits no tenderness.  Abdominal: Soft. There is no tenderness.  Musculoskeletal: Normal range of motion. He exhibits no edema.  Neurological: He is alert and oriented to person, place, and time.  Skin: Skin is warm and dry. He is not diaphoretic.  Psychiatric: He has a normal mood and affect.  Nursing note and vitals reviewed.   ED Course  Procedures (including critical care time) Medications  gi cocktail (Maalox,Lidocaine,Donnatal) (30 mLs Oral Given 09/18/14 0058)  Labs Review Labs Reviewed  Rosezena Sensor, ED    Imaging Review Dg Chest 2 View  09/16/2014   CLINICAL DATA:  Left chest pain today.  EXAM: CHEST  2 VIEW  COMPARISON:  None.  FINDINGS: The cardiomediastinal silhouette is unremarkable.  There is no evidence of focal airspace disease, pulmonary edema, suspicious pulmonary nodule/mass, pleural effusion, or  pneumothorax. No acute bony abnormalities are identified.  IMPRESSION: No active cardiopulmonary disease.   Electronically Signed   By: Harmon Pier M.D.   On: 09/16/2014 18:20     EKG Interpretation None      MDM   Final diagnoses:  Chest pain, atypical    Filed Vitals:   09/18/14 0057  BP: 118/78  Pulse: 59  Temp:   Resp: 83   34 yo M presenting to the ED for CP, third visit in three days for same symptoms. Perc negative, low low suspicion for PE as patient is not hypoxic, tachypnea or tachycardic, VSS, no tracheal deviation, no JVD or new murmur, RRR, breath sounds equal bilaterally, EKG without acute abnormalities, negative troponin, and negative CXR last evening. No early familial history of  cardiac disorders. Advised to f/u with PCP. Return precautions discussed. Patient / Family / Caregiver informed of clinical course, understand medical decision-making and is agreeable to plan. Patient is stable at time of discharge.     Francee Piccolo, PA-C 09/18/14 0103  Layla Maw Ward, DO 09/18/14 1610

## 2014-09-17 NOTE — ED Notes (Addendum)
Patient c/o chest pain, started yesterday while watching TV. Patient was seen here yesterday and worked up for the same. Patient states today dizziness is worse. Patient states he was smoking marijuana before pain started yesterday, denies any additional use since that time.

## 2014-09-18 ENCOUNTER — Emergency Department (HOSPITAL_COMMUNITY)
Admission: EM | Admit: 2014-09-18 | Discharge: 2014-09-18 | Disposition: A | Discharge status: Home / Self Care | Payer: Self-pay | Attending: Emergency Medicine | Admitting: Emergency Medicine

## 2014-09-18 ENCOUNTER — Encounter (HOSPITAL_COMMUNITY): Payer: Self-pay | Admitting: *Deleted

## 2014-09-18 DIAGNOSIS — Z87891 Personal history of nicotine dependence: Secondary | ICD-10-CM | POA: Insufficient documentation

## 2014-09-18 DIAGNOSIS — R0989 Other specified symptoms and signs involving the circulatory and respiratory systems: Secondary | ICD-10-CM | POA: Insufficient documentation

## 2014-09-18 DIAGNOSIS — F419 Anxiety disorder, unspecified: Secondary | ICD-10-CM | POA: Insufficient documentation

## 2014-09-18 DIAGNOSIS — R51 Headache: Secondary | ICD-10-CM | POA: Insufficient documentation

## 2014-09-18 DIAGNOSIS — R0789 Other chest pain: Secondary | ICD-10-CM

## 2014-09-18 DIAGNOSIS — K5900 Constipation, unspecified: Secondary | ICD-10-CM | POA: Insufficient documentation

## 2014-09-18 DIAGNOSIS — R42 Dizziness and giddiness: Secondary | ICD-10-CM

## 2014-09-18 LAB — COMPREHENSIVE METABOLIC PANEL
ALT: 22 U/L (ref 17–63)
AST: 19 U/L (ref 15–41)
Albumin: 4.2 g/dL (ref 3.5–5.0)
Alkaline Phosphatase: 61 U/L (ref 38–126)
Anion gap: 9 (ref 5–15)
BUN: 11 mg/dL (ref 6–20)
CO2: 24 mmol/L (ref 22–32)
CREATININE: 1.12 mg/dL (ref 0.61–1.24)
Calcium: 9.6 mg/dL (ref 8.9–10.3)
Chloride: 103 mmol/L (ref 101–111)
GFR calc non Af Amer: >60 mL/min (ref >60)
GLUCOSE: 99 mg/dL (ref 65–99)
Potassium: 3.6 mmol/L (ref 3.5–5.1)
Sodium: 136 mmol/L (ref 135–145)
TOTAL PROTEIN: 7.8 g/dL (ref 6.5–8.1)
Total Bilirubin: 0.4 mg/dL (ref 0.3–1.2)

## 2014-09-18 LAB — URINE MICROSCOPIC-ADD ON

## 2014-09-18 LAB — URINALYSIS, ROUTINE W REFLEX MICROSCOPIC
BILIRUBIN URINE: NEGATIVE
Glucose, UA: NEGATIVE mg/dL
HGB URINE DIPSTICK: NEGATIVE
Ketones, ur: NEGATIVE mg/dL
Nitrite: NEGATIVE
PROTEIN: NEGATIVE mg/dL
Specific Gravity, Urine: 1.012 (ref 1.005–1.030)
Urobilinogen, UA: 0.2 mg/dL (ref 0.0–1.0)
pH: 6 (ref 5.0–8.0)

## 2014-09-18 LAB — CBC
HCT: 42.1 % (ref 39.0–52.0)
Hemoglobin: 14.9 g/dL (ref 13.0–17.0)
MCH: 29.7 pg (ref 26.0–34.0)
MCHC: 35.4 g/dL (ref 30.0–36.0)
MCV: 83.9 fL (ref 78.0–100.0)
Platelets: 285 10*3/uL (ref 150–400)
RBC: 5.02 MIL/uL (ref 4.22–5.81)
RDW: 13.2 % (ref 11.5–15.5)
WBC: 10.3 10*3/uL (ref 4.0–10.5)

## 2014-09-18 LAB — I-STAT TROPONIN, ED: TROPONIN I, POC: 0 ng/mL (ref 0.00–0.08)

## 2014-09-18 LAB — LIPASE, BLOOD: LIPASE: 24 U/L (ref 22–51)

## 2014-09-18 LAB — TROPONIN I

## 2014-09-18 MED ORDER — IBUPROFEN 800 MG PO TABS: 800.0000 mg | ORAL_TABLET | Freq: Three times a day (TID) | ORAL | Status: DC

## 2014-09-18 MED ORDER — POLYETHYLENE GLYCOL 3350 17 GM/SCOOP PO POWD: 1.0000 | Freq: Once | ORAL | Status: DC

## 2014-09-18 MED ORDER — ONDANSETRON 4 MG PO TBDP
4.0000 mg | ORAL_TABLET | Freq: Once | ORAL | Status: AC
  Administered 2014-09-18: 4 mg via ORAL
  Filled 2014-09-18: qty 1

## 2014-09-18 MED ORDER — HYDROXYZINE HCL 25 MG PO TABS: 25.0000 mg | ORAL_TABLET | Freq: Three times a day (TID) | ORAL | Status: DC | PRN

## 2014-09-18 MED ORDER — LORAZEPAM 1 MG PO TABS
1.0000 mg | ORAL_TABLET | Freq: Once | ORAL | Status: AC
  Administered 2014-09-18: 1 mg via ORAL
  Filled 2014-09-18: qty 1

## 2014-09-18 MED ORDER — GI COCKTAIL ~~LOC~~
30.0000 mL | Freq: Once | ORAL | Status: AC
  Administered 2014-09-18: 30 mL via ORAL
  Filled 2014-09-18: qty 30

## 2014-09-18 MED ORDER — ALUM & MAG HYDROXIDE-SIMETH 200-200-20 MG/5ML PO SUSP: 15.0000 mL | Freq: Four times a day (QID) | ORAL | Status: DC | PRN

## 2014-09-18 MED ORDER — MECLIZINE HCL 12.5 MG PO TABS: 12.5000 mg | ORAL_TABLET | Freq: Three times a day (TID) | ORAL | Status: DC | PRN

## 2014-09-18 MED ORDER — IBUPROFEN 800 MG PO TABS
800.0000 mg | ORAL_TABLET | Freq: Once | ORAL | Status: AC
  Administered 2014-09-18: 800 mg via ORAL
  Filled 2014-09-18: qty 1
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
## 2014-09-18 NOTE — ED Notes (Signed)
Pt c/o chest pain, abd pain, dizziness, dry mouth, eye pain, headache, back pain, sweating since 09/16/14. Pt states that this is his 4th visit for the same symptoms in 3 days. States that he was told he is health but does not believe it. States that he has tried his maalox and anxiety pill with no relief. States that she symptoms are worse at night.

## 2014-09-18 NOTE — Discharge Instructions (Signed)
Panic Attacks °Panic attacks are sudden, short-lived surges of severe anxiety, fear, or discomfort. They may occur for no reason when you are relaxed, when you are anxious, or when you are sleeping. Panic attacks may occur for a number of reasons:  °· Healthy people occasionally have panic attacks in extreme, life-threatening situations, such as war or natural disasters. Normal anxiety is a protective mechanism of the body that helps us react to danger (fight or flight response). °· Panic attacks are often seen with anxiety disorders, such as panic disorder, social anxiety disorder, generalized anxiety disorder, and phobias. Anxiety disorders cause excessive or uncontrollable anxiety. They may interfere with your relationships or other life activities. °· Panic attacks are sometimes seen with other mental illnesses, such as depression and posttraumatic stress disorder. °· Certain medical conditions, prescription medicines, and drugs of abuse can cause panic attacks. °SYMPTOMS  °Panic attacks start suddenly, peak within 20 minutes, and are accompanied by four or more of the following symptoms: °· Pounding heart or fast heart rate (palpitations). °· Sweating. °· Trembling or shaking. °· Shortness of breath or feeling smothered. °· Feeling choked. °· Chest pain or discomfort. °· Nausea or strange feeling in your stomach. °· Dizziness, light-headedness, or feeling like you will faint. °· Chills or hot flushes. °· Numbness or tingling in your lips or hands and feet. °· Feeling that things are not real or feeling that you are not yourself. °· Fear of losing control or going crazy. °· Fear of dying. °Some of these symptoms can mimic serious medical conditions. For example, you may think you are having a heart attack. Although panic attacks can be very scary, they are not life threatening. °DIAGNOSIS  °Panic attacks are diagnosed through an assessment by your health care provider. Your health care provider will ask  questions about your symptoms, such as where and when they occurred. Your health care provider will also ask about your medical history and use of alcohol and drugs, including prescription medicines. Your health care provider may order blood tests or other studies to rule out a serious medical condition. Your health care provider may refer you to a mental health professional for further evaluation. °TREATMENT  °· Most healthy people who have one or two panic attacks in an extreme, life-threatening situation will not require treatment. °· The treatment for panic attacks associated with anxiety disorders or other mental illness typically involves counseling with a mental health professional, medicine, or a combination of both. Your health care provider will help determine what treatment is best for you. °· Panic attacks due to physical illness usually go away with treatment of the illness. If prescription medicine is causing panic attacks, talk with your health care provider about stopping the medicine, decreasing the dose, or substituting another medicine. °· Panic attacks due to alcohol or drug abuse go away with abstinence. Some adults need professional help in order to stop drinking or using drugs. °HOME CARE INSTRUCTIONS  °· Take all medicines as directed by your health care provider.   °· Schedule and attend follow-up visits as directed by your health care provider. It is important to keep all your appointments. °SEEK MEDICAL CARE IF: °· You are not able to take your medicines as prescribed. °· Your symptoms do not improve or get worse. °SEEK IMMEDIATE MEDICAL CARE IF:  °· You experience panic attack symptoms that are different than your usual symptoms. °· You have serious thoughts about hurting yourself or others. °· You are taking medicine for panic attacks and   have a serious side effect. °MAKE SURE YOU: °· Understand these instructions. °· Will watch your condition. °· Will get help right away if you are not  doing well or get worse. °Document Released: 04/04/2005 Document Revised: 04/09/2013 Document Reviewed: 11/16/2012 °ExitCare® Patient Information ©2015 [employer]. This information is not intended to replace advice given to you by your health care provider. Make sure you discuss any questions you have with your health care provider. ° °Emergency Department Resource Guide °1) Find a Doctor and Pay Out of Pocket °Although you won't have to find out who is covered by your insurance plan, it is a good idea to ask around and get recommendations. You will then need to call the office and see if the doctor you have chosen will accept you as a new patient and what types of options they offer for patients who are self-pay. Some doctors offer discounts or will set up payment plans for their patients who do not have insurance, but you will need to ask so you aren't surprised when you get to your appointment. ° °2) Contact Your Local Health Department °Not all health departments have doctors that can see patients for sick visits, but many do, so it is worth a call to see if yours does. If you don't know where your local health department is, you can check in your phone book. The CDC also has a tool to help you locate your state's health department, and many state websites also have listings of all of their local health departments. ° °3) Find a Walk-in Clinic °If your illness is not likely to be very severe or complicated, you may want to try a walk in clinic. These are popping up all over the country in pharmacies, drugstores, and shopping centers. They're usually staffed by nurse practitioners or physician assistants that have been trained to treat common illnesses and complaints. They're usually fairly quick and inexpensive. However, if you have serious medical issues or chronic medical problems, these are probably not your best option. ° °No Primary Care Doctor: °- Call [facility] at  832-8000 - they can help you  locate a primary care doctor that  accepts your insurance, provides certain services, etc. °- Physician Referral Service- 1-800-533-3463 ° °Chronic Pain Problems: °Organization         Address  Phone   Notes  °[facility]  (336) 297-2271 Patients need to be referred by their primary care doctor.  ° °Medication Assistance: °Organization         Address  Phone   Notes  °[place] [facility] [address] °[place], [place] [address] (336) 641-8030 --Must be a resident of [place] °-- Must have NO insurance coverage whatsoever (no Medicaid/ Medicare, etc.) °-- The pt. MUST have a primary care doctor that directs their care regularly and follows them in the community °  °[facility]  (866) 331-1348   °[employer]  (888) 892-1162   ° °Agencies that provide inexpensive medical care: °Organization         Address  Phone   Notes  °[facility]  (336) 832-8035   °[facility]    (336) 832-7272   °[facility] [address] °[place], [place] [address] (336) 832-4777   °[facility] [address], °[place] (336) 271-4999   °[facility]    (336) 373-0678   °[facility]    (336) 272-1050   °  [facility] ° [address], [place] Phone:  (336) 832-4444, Fax:  (336) 832-4440 Hours of Operation:  9 am - 6 pm, M-F.  Also accepts Medicaid/Medicare and self-pay.  °[facility] ° [address], [place] Phone: (336) [phone], Fax: (336) [phone]. Hours of Operation:  8:30 am - 5:30 pm, M-F.  Also accepts Medicaid and self-pay.  °[facility] [place] [address], [place] Phone: (336) 878-6027   °[facility] [address], [place], [place] (336)723-1848, Ext. 123 Mondays & Thursdays: 7-9 AM.  First 15 patients are seen on a first come, first serve basis. °  ° °Medicaid-accepting [place]  Providers: ° °Organization         Address  Phone   Notes  °[facility] [address], [place] (336) 641-2100 Also accepts self-pay patients.  °[facility] [address], [place] ° (336) 856-9996   °[facility] [address], [place] (336) 288-8857   °[facility] [address] [place] [address], [place] (336) 299-7000   °Veita Bland [address], [place]  ° (336) 373-1557 Only accepts [place] Access Medicaid patients after they have their name applied to their card.  ° °Self-Pay (no insurance) in [place]: ° °Organization         Address  Phone   Notes  °Sickle Cell Patients, [facility] [address], [place] (336) 832-1970   °[facility] [address], [place] (336) 832-4400   °[facility] ° [address], [place] (336) 992-4800   °[facility]/Dr. Osei-Bonsu ° [address] [place] [address], [place] or [address], [place] (336) [phone] Phone number for both [place] and [place] locations is the same.  °[facility] [address], [place] (336) 299-0000   °[facility] [address] [place] [address], [place] or [address] (336) 852-7530 °(336) 878-2260   °[facility] [address], [place] (336) 350-1642, phone; (336) 294-5005, fax Sees patients 1st and 3rd Saturday of every month.  Must not qualify for public or private insurance (i.e. Medicaid, Medicare, [place] Health Choice, Veterans' Benefits) • Household income should be no more than 200% of the poverty level •The clinic cannot treat you if you are pregnant or think you are pregnant • Sexually transmitted diseases are not treated at the clinic.  ° ° °Dental Care: °Organization         Address  Phone  Notes  °[facility] [address], [place] (336) 641-6152 Accepts children up to age 21 who are enrolled in Medicaid or [place] Health Choice; pregnant women with a Medicaid card; and children who have applied for Medicaid or [place] Health Choice, but were declined, whose parents can pay a reduced fee at time of service.  °[facility] [place]  [address], [place] (336) 641-7733 Accepts children up to age 21 who are enrolled in Medicaid or [place] Health Choice; pregnant women with a Medicaid card; and children who have applied for Medicaid or  Health Choice, but were declined, whose parents can pay a reduced fee at time of service.  °[facility] ° [address], [place] (336) 641-4533 Patients are seen by appointment only. Walk-ins are not accepted. [facility] will see patients 18 years of age and older. °Monday - Tuesday (8am-5pm) °Most Wednesdays (8:30-5pm) °$30 per visit, cash only  °[facility] ° [address], [place] (336) 641-4533 Patients are seen by appointment only. Walk-ins are not accepted. [facility] will see patients 18 years of age and older. °One Wednesday Evening (Monthly: Volunteer Based).  $30 per visit, cash only  °[facility]  (919) [phone] for adults; Children under age 4, call Graduate Pediatric Dentistry at ([phone]) [phone]. Children aged 4-14, please call ([phone]) [phone] to request a pediatric application. ° Dental services are provided in all areas of dental care including fillings, crowns and bridges, complete and partial dentures, implants, gum treatment, root canals, and extractions. Preventive care is also provided. Treatment is provided to both adults and children. °Patients are selected via a lottery and there is often a waiting list. °  °[facility] [address], °[place] ° (336) 763-8833 [email or web address] °  °[facility]  [address], [place], [place] (336)723-1848, Ext. 123 Second and Fourth Thursday of each month, opens at 6:30 AM; Clinic ends at 9 AM.  Patients are seen on a first-come first-served basis, and a limited number are seen during each clinic.  ° °[facility] ° [address], [place], [place] (336) 723-7904   Eligibility Requirements °You must have lived in [place], [place], or [place] counties for at least the last three months. °  You cannot be eligible for state or federal sponsored healthcare insurance, including Veterans Administration, Medicaid, or Medicare. °  You generally cannot be eligible for healthcare insurance through your employer.  °  How to apply: °Eligibility screenings are held every Tuesday and Wednesday afternoon from 1:00 pm until 4:00 pm. You do not need an appointment for the interview!  °[facility] [address], [place], [place] 336-631-2330   °[facility]  336-342-8273   °[facility]  336-703-3100   °[facility]  336-570-6415   ° °Behavioral Health Resources in the Community: °Intensive Outpatient Programs °Organization         Address  Phone  Notes  °[facility] [address], [place], [place] 336-878-6098   °[facility] [address], [place], [place] 336-832-9800   °[facility] [address], [place], [place] ° 336-882-2125   °[facility] [address],  °[place], [place] 1-800-853-5163 or 336-641-4981   °Substance Abuse Resources °Organization         Address  Phone  Notes  °[facility]  336-882-2125   °[facility]  336-784-9470   °[facility]  336-285-9073   °[facility]  336-845-3988   °Residential & Outpatient Substance Abuse Program  1-800-659-3381   °Psychological Services °Organization         Address  Phone  Notes  ° Health  336- 832-9600     °[facility]  336- 378-7881   °[facility] [address], [place] 1-800-853-5163 or 336-641-4981   ° °Mobile Crisis Teams °Organization         Address  Phone  Notes  °[facility], Mobile Crisis Care Unit  1-877-626-1772   °Assertive °[facility] ° [address] [place], [place] 336-834-9664   °  Sharon DeEsch [address] °[place] 336-554-5454   ° °Self-Help/Support Groups °Organization         Address  Phone             Notes  °[facility] - variety of support groups  336- 373-1402 Call for more information  °Narcotics Anonymous (NA), [facility] [address], °[place]  2 meetings at this location  ° °Residential Treatment Programs °Organization         Address  Phone  Notes  °[facility] [address],    °[place]  1-866-801-8205   °[facility] ° [address], [place], [place] 704-293-8524   °[facility] [address], [place] 336-845-3988 Admissions: 8am-3pm M-F  °[facility] [address]    °[place], [place] 336-841-1104   °[facility] [address], [place], [place] 336-379-7146   °[facility] [address]  °[place], [place] 336-285-9073   °[facility] Intensive Outpatient [address], [place], [place] 336-852-3033   °[facility] [address]  °[place], [place] 1-877-615-2722 or 336-784-9470   °[facility] [address] [place], [place] 336-227-7417 Accepts Medicaid  °[facility] [address]  °[place] 1-800-659-3381 Substance Abuse/Addiction Treatment  ° °[place] Behavioral Health Resources °Organization         Address  Phone  Notes  °[facility]  (888) 581-9988   °Julie Brannon, PhD [address] [place], [place]   (336) 349-5553 or (336) 951-0000    °[facility]   [address] °[place], [place] (336) 349-4454   °[facility] [address], [place], [place] (336) 342-8316 Insurance/Medicaid/sponsorship through [facility]  °[facility] [address]                                    [place], [place] (336) 342-8316 Therapy/tele-psych/case  °[facility] [address]  ° [place], [place] (336) 349-2233    °Dr. Arfeen  (336) 349-4544   °[facility] [place]  [employer] [place] [facility] 1) [address], [place] °2) [address], [place] °3)  [address], [place] (336) 349-3220 °(336) 342-7768 ° °(336) 342-8140   °[place] [facility] (336) 342-1394 or (336) 342-3537 (After Hours)    ° ° ° °

## 2014-09-18 NOTE — Discharge Instructions (Signed)
Please follow up with your primary care physician in 1-2 days. If you do not have one please call the [facility] number listed above. Please read all discharge instructions and return precautions.  ° ° °Chest Pain (Nonspecific) °It is often hard to give a specific diagnosis for the cause of chest pain. There is always a chance that your pain could be related to something serious, such as a heart attack or a blood clot in the lungs. You need to follow up with your health care provider for further evaluation. °CAUSES  °· Heartburn. °· Pneumonia or bronchitis. °· Anxiety or stress. °· Inflammation around your heart (pericarditis) or lung (pleuritis or pleurisy). °· A blood clot in the lung. °· A collapsed lung (pneumothorax). It can develop suddenly on its own (spontaneous pneumothorax) or from trauma to the chest. °· Shingles infection (herpes zoster virus). °The chest wall is composed of bones, muscles, and cartilage. Any of these can be the source of the pain. °· The bones can be bruised by injury. °· The muscles or cartilage can be strained by coughing or overwork. °· The cartilage can be affected by inflammation and become sore (costochondritis). °DIAGNOSIS  °Lab tests or other studies may be needed to find the cause of your pain. Your health care provider may have you take a test called an ambulatory electrocardiogram (ECG). An ECG records your heartbeat patterns over a 24-hour period. You may also have other tests, such as: °· Transthoracic echocardiogram (TTE). During echocardiography, sound waves are used to evaluate how blood flows through your heart. °· Transesophageal echocardiogram (TEE). °· Cardiac monitoring. This allows your health care provider to monitor your heart rate and rhythm in real time. °· Holter monitor. This is a portable device that records your heartbeat and can help diagnose heart arrhythmias. It allows your health care provider to track your heart activity for  several days, if needed. °· Stress tests by exercise or by giving medicine that makes the heart beat faster. °TREATMENT  °· Treatment depends on what may be causing your chest pain. Treatment may include: °¨ Acid blockers for heartburn. °¨ Anti-inflammatory medicine. °¨ Pain medicine for inflammatory conditions. °¨ Antibiotics if an infection is present. °· You may be advised to change lifestyle habits. This includes stopping smoking and avoiding alcohol, caffeine, and chocolate. °· You may be advised to keep your head raised (elevated) when sleeping. This reduces the chance of acid going backward from your stomach into your esophagus. °Most of the time, nonspecific chest pain will improve within 2-3 days with rest and mild pain medicine.  °HOME CARE INSTRUCTIONS  °· If antibiotics were prescribed, take them as directed. Finish them even if you start to feel better. °· For the next few days, avoid physical activities that bring on chest pain. Continue physical activities as directed. °· Do not use any tobacco products, including cigarettes, chewing tobacco, or electronic cigarettes. °· Avoid drinking alcohol. °· Only take medicine as directed by your health care provider. °· Follow your health care provider's suggestions for further testing if your chest pain does not go away. °· Keep any follow-up appointments you made. If you do not go to an appointment, you could develop lasting (chronic) problems with pain. If there is any problem keeping an appointment, call to reschedule. °SEEK MEDICAL CARE IF:  °· Your chest pain does not go away, even after treatment. °· You have a rash with blisters on your chest. °· You have a fever. °  SEEK IMMEDIATE MEDICAL CARE IF:  °· You have increased chest pain or pain that spreads to your arm, neck, jaw, back, or abdomen. °· You have shortness of breath. °· You have an increasing cough, or you cough up blood. °· You have severe back or abdominal pain. °· You feel nauseous or  vomit. °· You have severe weakness. °· You faint. °· You have chills. °This is an emergency. Do not wait to see if the pain will go away. Get medical help at once. Call your local emergency services (911 in [place] Do not drive yourself to the hospital. °MAKE SURE YOU:  °· Understand these instructions. °· Will watch your condition. °· Will get help right away if you are not doing well or get worse. °Document Released: 01/12/2005 Document Revised: 04/09/2013 Document Reviewed: 11/08/2007 °ExitCare® Patient Information ©2015 [employer]. This information is not intended to replace advice given to you by your health care provider. Make sure you discuss any questions you have with your health care provider. ° °

## 2014-09-18 NOTE — ED Notes (Signed)
Pt c/o dizziness, worse with standing and moving; pt states that he feels like he may swallow his tongue when laying down; pt also c/o tongue feeling dry; pt has been seen numerous times over the last few days for chest pain and all workup has been negative; pt states that the chest pain has mostly resolved; pt c/o soreness to left upper chest

## 2014-09-18 NOTE — Discharge Instructions (Signed)
Constipation °Constipation is when a person has fewer than three bowel movements a week, has difficulty having a bowel movement, or has stools that are dry, hard, or larger than normal. As people grow older, constipation is more common. If you try to fix constipation with medicines that make you have a bowel movement (laxatives), the problem may get worse. Long-term laxative use may cause the muscles of the colon to become weak. A low-fiber diet, not taking in enough fluids, and taking certain medicines may make constipation worse.  °CAUSES  °· Certain medicines, such as antidepressants, pain medicine, iron supplements, antacids, and water pills.   °· Certain diseases, such as diabetes, irritable bowel syndrome (IBS), thyroid disease, or depression.   °· Not drinking enough water.   °· Not eating enough fiber-rich foods.   °· Stress or travel.   °· Lack of physical activity or exercise.   °· Ignoring the urge to have a bowel movement.   °· Using laxatives too much.   °SIGNS AND SYMPTOMS  °· Having fewer than three bowel movements a week.   °· Straining to have a bowel movement.   °· Having stools that are hard, dry, or larger than normal.   °· Feeling full or bloated.   °· Pain in the lower abdomen.   °· Not feeling relief after having a bowel movement.   °DIAGNOSIS  °Your health care provider will take a medical history and perform a physical exam. Further testing may be done for severe constipation. Some tests may include: °· A barium enema X-ray to examine your rectum, colon, and, sometimes, your small intestine.   °· A sigmoidoscopy to examine your lower colon.   °· A colonoscopy to examine your entire colon. °TREATMENT  °Treatment will depend on the severity of your constipation and what is causing it. Some dietary treatments include drinking more fluids and eating more fiber-rich foods. Lifestyle treatments may include regular exercise. If these diet and lifestyle recommendations do not help, your health care  provider may recommend taking over-the-counter laxative medicines to help you have bowel movements. Prescription medicines may be prescribed if over-the-counter medicines do not work.  °HOME CARE INSTRUCTIONS  °· Eat foods that have a lot of fiber, such as fruits, vegetables, whole grains, and beans. °· Limit foods high in fat and processed sugars, such as french fries, hamburgers, cookies, candies, and soda.   °· A fiber supplement may be added to your diet if you cannot get enough fiber from foods.   °· Drink enough fluids to keep your urine clear or pale yellow.   °· Exercise regularly or as directed by your health care provider.   °· Go to the restroom when you have the urge to go. Do not hold it.   °· Only take over-the-counter or prescription medicines as directed by your health care provider. Do not take other medicines for constipation without talking to your health care provider first.   °SEEK IMMEDIATE MEDICAL CARE IF:  °· You have bright red blood in your stool.   °· Your constipation lasts for more than 4 days or gets worse.   °· You have abdominal or rectal pain.   °· You have thin, pencil-like stools.   °· You have unexplained weight loss. °MAKE SURE YOU:  °· Understand these instructions. °· Will watch your condition. °· Will get help right away if you are not doing well or get worse. °Document Released: 01/01/2004 Document Revised: 04/09/2013 Document Reviewed: 01/14/2013 °Exit[employer]® Patient Information ©2015 [employer]. This information is not intended to replace advice given to you by your health care provider. Make sure you discuss any questions   you have with your health care provider. Costochondritis Costochondritis, sometimes called Tietze syndrome, is a swelling and irritation (inflammation) of the tissue (cartilage) that connects your ribs with your breastbone (sternum). It causes pain in the chest and rib area. Costochondritis usually goes away on its own over time. It can take up to 6  weeks or longer to get better, especially if you are unable to limit your activities. CAUSES  Some cases of costochondritis have no known cause. Possible causes include:  Injury (trauma).  Exercise or activity such as lifting.  Severe coughing. SIGNS AND SYMPTOMS  Pain and tenderness in the chest and rib area.  Pain that gets worse when coughing or taking deep breaths.  Pain that gets worse with specific movements. DIAGNOSIS  Your health care provider will do a physical exam and ask about your symptoms. Chest X-rays or other tests may be done to rule out other problems. TREATMENT  Costochondritis usually goes away on its own over time. Your health care provider may prescribe medicine to help relieve pain. HOME CARE INSTRUCTIONS   Avoid exhausting physical activity. Try not to strain your ribs during normal activity. This would include any activities using chest, abdominal, and side muscles, especially if heavy weights are used.  Apply ice to the affected area for the first 2 days after the pain begins.  Put ice in a plastic bag.  Place a towel between your skin and the bag.  Leave the ice on for 20 minutes, 2-3 times a day.  Only take over-the-counter or prescription medicines as directed by your health care provider. SEEK MEDICAL CARE IF:  You have redness or swelling at the rib joints. These are signs of infection.  Your pain does not go away despite rest or medicine. SEEK IMMEDIATE MEDICAL CARE IF:   Your pain increases or you are very uncomfortable.  You have shortness of breath or difficulty breathing.  You cough up blood.  You have worse chest pains, sweating, or vomiting.  You have a fever or persistent symptoms for more than 2-3 days.  You have a fever and your symptoms suddenly get worse. MAKE SURE YOU:   Understand these instructions.  Will watch your condition.  Will get help right away if you are not doing well or get worse. Document Released:  01/12/2005 Document Revised: 01/23/2013 Document Reviewed: 11/06/2012 [facility] Patient Information 2015 [place], [place]. This information is not intended to replace advice given to you by your health care provider. Make sure you discuss any questions you have with your health care provider. Dizziness Dizziness is a common problem. It is a feeling of unsteadiness or light-headedness. You may feel like you are about to faint. Dizziness can lead to injury if you stumble or fall. A person of any age group can suffer from dizziness, but dizziness is more common in older adults. CAUSES  Dizziness can be caused by many different things, including:  Middle ear problems.  Standing for too long.  Infections.  An allergic reaction.  Aging.  An emotional response to something, such as the sight of blood.  Side effects of medicines.  Tiredness.  Problems with circulation or blood pressure.  Excessive use of alcohol or medicines, or illegal drug use.  Breathing too fast (hyperventilation).  An irregular heart rhythm (arrhythmia).  A low red blood cell count (anemia).  Pregnancy.  Vomiting, diarrhea, fever, or other illnesses that cause body fluid loss (dehydration).  Diseases or conditions such as Parkinson's disease, high blood pressure (  hypertension), diabetes, and thyroid problems.  Exposure to extreme heat. DIAGNOSIS  Your health care provider will ask about your symptoms, perform a physical exam, and perform an electrocardiogram (ECG) to record the electrical activity of your heart. Your health care provider may also perform other heart or blood tests to determine the cause of your dizziness. These may include:  Transthoracic echocardiogram (TTE). During echocardiography, sound waves are used to evaluate how blood flows through your heart.  Transesophageal echocardiogram (TEE).  Cardiac monitoring. This allows your health care provider to monitor your heart rate and rhythm in  real time.  Holter monitor. This is a portable device that records your heartbeat and can help diagnose heart arrhythmias. It allows your health care provider to track your heart activity for several days if needed.  Stress tests by exercise or by giving medicine that makes the heart beat faster. TREATMENT  Treatment of dizziness depends on the cause of your symptoms and can vary greatly. HOME CARE INSTRUCTIONS   Drink enough fluids to keep your urine clear or pale yellow. This is especially important in very hot weather. In older adults, it is also important in cold weather.  Take your medicine exactly as directed if your dizziness is caused by medicines. When taking blood pressure medicines, it is especially important to get up slowly.  Rise slowly from chairs and steady yourself until you feel okay.  In the morning, first sit up on the side of the bed. When you feel okay, stand slowly while holding onto something until you know your balance is fine.  Move your legs often if you need to stand in one place for a long time. Tighten and relax your muscles in your legs while standing.  Have someone stay with you for 1-2 days if dizziness continues to be a problem. Do this until you feel you are well enough to stay alone. Have the person call your health care provider if he or she notices changes in you that are concerning.  Do not drive or use heavy machinery if you feel dizzy.  Do not drink alcohol. SEEK IMMEDIATE MEDICAL CARE IF:   Your dizziness or light-headedness gets worse.  You feel nauseous or vomit.  You have problems talking, walking, or using your arms, hands, or legs.  You feel weak.  You are not thinking clearly or you have trouble forming sentences. It may take a friend or family member to notice this.  You have chest pain, abdominal pain, shortness of breath, or sweating.  Your vision changes.  You notice any bleeding.  You have side effects from medicine that  seems to be getting worse rather than better. MAKE SURE YOU:   Understand these instructions.  Will watch your condition.  Will get help right away if you are not doing well or get worse. Document Released: 09/28/2000 Document Revised: 04/09/2013 Document Reviewed: 10/22/2010 [facility] Patient Information 2015 [place], [place]. This information is not intended to replace advice given to you by your health care provider. Make sure you discuss any questions you have with your health care provider.

## 2014-09-18 NOTE — ED Provider Notes (Signed)
CSN: [id number]     Arrival date & time 09/18/14  2013 History   First MD Initiated Contact with Patient 09/18/14 2221     No chief complaint on file.    (Consider location/radiation/quality/duration/timing/severity/associated sxs/prior Treatment) Patient is a 34 y.o. male presenting with dizziness. The history is provided by the patient.  Dizziness Quality:  Lightheadedness Severity:  Moderate Onset quality:  Gradual Duration:  2 days Timing:  Constant Progression:  Unchanged Chronicity:  New Context: head movement and standing up   Context: not with loss of consciousness   Relieved by:  Nothing Worsened by:  Nothing Ineffective treatments:  None tried Associated symptoms: headaches (starting today) and shortness of breath   Associated symptoms: no syncope and no vomiting   Risk factors: new medications (atarax this morning)     History reviewed. No pertinent past medical history. History reviewed. No pertinent past surgical history. No family history on file. History  Substance Use Topics  . Smoking status: Former Smoker -- 0.50 packs/day    Types: Cigars  . Smokeless tobacco: Not on file  . Alcohol Use: Yes     Comment: social    Review of Systems  Respiratory: Positive for shortness of breath.   Cardiovascular: Negative for syncope.  Gastrointestinal: Negative for vomiting.  Neurological: Positive for dizziness and headaches (starting today).  All other systems reviewed and are negative.     Allergies  Red dye and Yellow dyes (non-tartrazine)  Home Medications   Prior to Admission medications   Medication Sig Start Date End Date Taking? Authorizing Provider  alum & mag hydroxide-simeth (MAALOX ADVANCED) 200-200-20 MG/5ML suspension Take 15 mLs by mouth every 6 (six) hours as needed for indigestion or heartburn. 09/18/14  Yes Jennifer Piepenbrink, PA-C  Aspirin-Salicylamide-Caffeine (BC HEADACHE POWDER PO) Take 1 packet by mouth daily as needed (ankle pain).    Yes Historical Provider, MD  hydrOXYzine (ATARAX/VISTARIL) 25 MG tablet Take 1 tablet (25 mg total) by mouth every 8 (eight) hours as needed for anxiety. 09/18/14  Yes Kristen N Ward, DO  loratadine (CLARITIN) 10 MG tablet Take 10 mg by mouth daily as needed for allergies.   Yes Historical Provider, MD  ibuprofen (ADVIL,MOTRIN) 800 MG tablet Take 1 tablet (800 mg total) by mouth 3 (three) times daily. 09/18/14   Lyndal Pulleyaniel Treyven Lafauci, MD  meclizine (ANTIVERT) 12.5 MG tablet Take 1 tablet (12.5 mg total) by mouth 3 (three) times daily as needed for dizziness. 09/18/14   Lyndal Pulleyaniel Tammie Ellsworth, MD  polyethylene glycol powder (MIRALAX) powder Take 255 g by mouth once. 09/18/14   Lyndal Pulleyaniel Cheng Dec, MD   BP 122/74 mmHg  Pulse 72  Temp(Src) 98 F (36.7 C) (Oral)  Resp 17  SpO2 98% Physical Exam  Constitutional: He is oriented to person, place, and time. He appears well-developed and well-nourished. No distress.  HENT:  Head: Normocephalic and atraumatic.  Eyes: Conjunctivae are normal.  Neck: Neck supple. No tracheal deviation present.  Cardiovascular: Normal rate, regular rhythm and normal heart sounds.   Pulmonary/Chest: Effort normal and breath sounds normal. No respiratory distress. He has no wheezes. He has no rales. He exhibits tenderness (over left sternal border).  Abdominal: Soft. He exhibits no distension. There is no tenderness. There is no guarding.  Neurological: He is alert and oriented to person, place, and time. No sensory deficit. Coordination and gait normal. GCS eye subscore is 4. GCS verbal subscore is 5. GCS motor subscore is 6.  Skin: Skin is warm and dry.  Psychiatric:  He has a normal mood and affect.    ED Course  Procedures (including critical care time) Labs Review Labs Reviewed  URINALYSIS, ROUTINE W REFLEX MICROSCOPIC (NOT AT [facility]) - Abnormal; Notable for the following:    Leukocytes, UA SMALL (*)    All other components within normal limits  URINE MICROSCOPIC-ADD ON - Abnormal; Notable for  the following:    Squamous Epithelial / LPF FEW (*)    All other components within normal limits  CBC  LIPASE, BLOOD  COMPREHENSIVE METABOLIC PANEL  TROPONIN I  I-STAT TROPOININ, ED    Imaging Review No results found.   EKG Interpretation   Date/Time:  Thursday September 18 2014 20:18:48 EDT Ventricular Rate:  79 PR Interval:  156 QRS Duration: 94 QT Interval:  376 QTC Calculation: 431 R Axis:   55 Text Interpretation:  Normal sinus rhythm Normal ECG No significant change  since last tracing Confirmed by KNAPP  MD-J, JON ([id number]) on 09/18/2014  11:01:08 PM      MDM   Final diagnoses:  Constipation, unspecified constipation type  Left-sided chest wall pain  Anxiety  Lightheadedness   34 year old otherwise healthy male presents with multiple complaints in his fifth emergency department visit in the last 3 days for similar symptoms. He has noted worsening dizziness mostly at night that occurs at rest and is variable in nature. He also complains of some musculoskeletal appearing chest pain over the left sternal border that is reproducible with palpation. He has not started on any NSAIDs for this pain yet. He is otherwise well-appearing and states that the Atarax he was prescribed for anxiety seems to be ineffective. He also relates not having a bowel movement over the last 3 days which may be contributing to his symptoms. He normally stools every day. He had another negative troponin here and has a very low risk profile for ACS. I do not believe that he has a PE and he is perc negative here. Discussed symptomatic control of dizziness with meclizine, constipation with MiraLAX, and acute musculoskeletal pain with a course of Motrin at home.    Lyndal Pulley, MD 09/18/14 6045  Linwood Dibbles, MD 09/19/14 [phone]

## 2014-09-18 NOTE — ED Provider Notes (Signed)
TIME SEEN: 5:50 AM  CHIEF COMPLAINT: Multiple complaints  HPI: Pt is a 34 y.o. male with no significant past medical history who presents to the emergency department for the fourth time in the past 48 hours for multiple complaints. He was initially seen in the emergency department for intermittent left-sided chest pain that improves after GI cocktail. He has had normal CBC, normal BMP, normal chest x-ray and 3 negative troponins. Reports his chest pain is improving but tonight he woke up feeling like "I was swallowing my tongue". Denies that he felt like his lips or tongue or swollen or tingling. He denies feeling short of breath. No sore throat. Also reports he has been feeling lightheaded. No vertigo. No vomiting or diarrhea. States he has felt weak all over. States he has not been eating and drinking well. Has had intermittent nausea.  Patient's girlfriend thinks that he has been anxious recently.  He has no history of PE or DVT. No recent hospitalization, surgery, trauma, international travel. No lower extremity swelling or pain.   States I was performing a neuro exam he had a brief episode of numbness in his left upper thigh that immediately resolved after several seconds. No other numbness, tingling or focal weakness. States "see that's the crazy stuff my body has been doing recently".  ROS: See HPI Constitutional: no fever  Eyes: no drainage  ENT: no runny nose   Cardiovascular:  chest pain  Resp: no SOB  GI: no vomiting GU: no dysuria Integumentary: no rash  Allergy: no hives  Musculoskeletal: no leg swelling  Neurological: no slurred speech ROS otherwise negative  PAST MEDICAL HISTORY/PAST SURGICAL HISTORY:  History reviewed. No pertinent past medical history.  MEDICATIONS:  Prior to Admission medications   Medication Sig Start Date End Date Taking? Authorizing Provider  alum & mag hydroxide-simeth (MAALOX ADVANCED) 200-200-20 MG/5ML suspension Take 15 mLs by mouth every 6  (six) hours as needed for indigestion or heartburn. 09/18/14   Jennifer Piepenbrink, PA-C  Aspirin-Salicylamide-Caffeine (BC HEADACHE POWDER PO) Take 1 packet by mouth daily as needed (ankle pain).    Historical Provider, MD  ibuprofen (ADVIL,MOTRIN) 800 MG tablet Take 1 tablet (800 mg total) by mouth 3 (three) times daily. Patient not taking: Reported on 09/16/2014 06/28/13   Roxy Horseman, PA-C  loratadine (CLARITIN) 10 MG tablet Take 10 mg by mouth daily as needed for allergies.    Historical Provider, MD    ALLERGIES:  Allergies  Allergen Reactions  . Red Dye Itching  . Yellow Dyes (Non-Tartrazine) Itching    SOCIAL HISTORY:  History  Substance Use Topics  . Smoking status: Former Smoker -- 0.50 packs/day    Types: Cigars  . Smokeless tobacco: Not on file  . Alcohol Use: Yes     Comment: social    FAMILY HISTORY: No family history on file.  EXAM: BP 129/90 mmHg  Pulse 77  Temp(Src) 98.3 F (36.8 C) (Oral)  Resp 19  Ht  (1.956 m)  Wt 320 lb (145.151 kg)  BMI 37.94 kg/m2  SpO2 98% CONSTITUTIONAL: Alert and oriented and responds appropriately to questions. Well-appearing; well-nourished, obese, appears anxious but otherwise not in distress HEAD: Normocephalic EYES: Conjunctivae clear, PERRL ENT: normal nose; no rhinorrhea; moist mucous membranes; pharynx without lesions noted, no tonsillar hypertrophy or exudate, no uvular deviation, no trismus or drooling, normal phonation, no stridor, no sign of dental abscess or dental caries, no Ludwig angina, no angioedema, tongue appears normal and sits flat in the bottom  of the mouth NECK: Supple, no meningismus, no LAD  CARD: RRR; S1 and S2 appreciated; no murmurs, no clicks, no rubs, no gallops RESP: Normal chest excursion without splinting or tachypnea; breath sounds clear and equal bilaterally; no wheezes, no rhonchi, no rales, no hypoxia or respiratory distress, speaking full sentences ABD/GI: Normal bowel sounds;  non-distended; soft, non-tender, no rebound, no guarding, no peritoneal signs BACK:  The back appears normal and is non-tender to palpation, there is no CVA tenderness EXT: Normal ROM in all joints; non-tender to palpation; no edema; normal capillary refill; no cyanosis, no calf tenderness or swelling    SKIN: Normal color for age and race; warm NEURO: Moves all extremities equally, sensation to light touch intact diffusely, cranial nerves II through XII intact; normal gait PSYCH: Patient appears anxious. No SI or HI. No hallucinations. Grooming and personal hygiene are appropriate.  MEDICAL DECISION MAKING: Patient here with multiple complaints that are vague with reassuring exam, reassuring vital signs. He has been seen now 4 times in the past 48 hours for various symptoms and has had normal workup. He has had normal EKGs, negative chest x-ray and 3 negative troponins. He is PERC negative.  He is neurologically intact. This very brief episode of numbness over the left anterior thigh that only lasted several seconds is nonspecific and I do not feel he needs head imaging at this time. Discussed with patient that I feel that this may be related to anxiety and his girlfriend agrees. She states he has increased stress recently. He doesn't have any current psychiatric safety concerns. I do not feel he needs any further emergent workup at this time and have strongly advised him to follow-up with her primary care provider. [facility] and wellness information, resource guide. He reports feeling nauseated. No nausea or vomiting. We'll give Zofran in the ED. Abdominal exam is benign. We'll discharge with Vistaril to use as needed for anxiety. Discussed return precautions. He verbalizes understanding and is comfortable with plan.       Preston MawKristen [name] Preston Boteler, DO 09/18/14 [phone]

## 2014-09-24 ENCOUNTER — Emergency Department (HOSPITAL_COMMUNITY)
Admission: EM | Admit: 2014-09-24 | Discharge: 2014-09-24 | Disposition: A | Discharge status: Home / Self Care | Payer: Self-pay | Attending: Emergency Medicine | Admitting: Emergency Medicine

## 2014-09-24 ENCOUNTER — Encounter (HOSPITAL_COMMUNITY): Payer: Self-pay | Admitting: Emergency Medicine

## 2014-09-24 DIAGNOSIS — Z7982 Long term (current) use of aspirin: Secondary | ICD-10-CM | POA: Insufficient documentation

## 2014-09-24 DIAGNOSIS — Z791 Long term (current) use of non-steroidal anti-inflammatories (NSAID): Secondary | ICD-10-CM | POA: Insufficient documentation

## 2014-09-24 DIAGNOSIS — R079 Chest pain, unspecified: Secondary | ICD-10-CM | POA: Insufficient documentation

## 2014-09-24 DIAGNOSIS — R200 Anesthesia of skin: Secondary | ICD-10-CM | POA: Insufficient documentation

## 2014-09-24 DIAGNOSIS — R202 Paresthesia of skin: Secondary | ICD-10-CM | POA: Insufficient documentation

## 2014-09-24 DIAGNOSIS — Z720 Tobacco use: Secondary | ICD-10-CM | POA: Insufficient documentation

## 2014-09-24 NOTE — ED Provider Notes (Signed)
CSN: [id number]     Arrival date & time 09/24/14  0208 History   First MD Initiated Contact with Patient 09/24/14 [id number]     Chief Complaint  Patient presents with  . Arm Pain     (Consider location/radiation/quality/duration/timing/severity/associated sxs/prior Treatment) HPI   Preston [name] is a(n) 34 y.o. male who presents to the ED with a cc of R arm paresthesia. He states that this morning around 2 am he developed pain in the left arm which he describes as shooting, numbness, tightness with paresthesia of the whole R hand. He denies any previous injury or similar sxs. He denies lying on the arm prior to symptom onset. Patient denies any change in temperature or color. He denies any swelling. He states that at the time. It began, it was 7 out of 10 pain, however, has resolved predominantly. He denies any current pain, but still has some paresthesia in the hand. He denies any sensations of heaviness or tightness in the arm. The patient has no risk factors for pulmonary embolus denies exogenous estrogen use, lower extremity pain or swelling, recent travel or immobilization, history of PE or DVT, family or personal history of bleeding or clotting disorders, cough or hemoptysis. The patient is a current smoker and uses marijuana and black and milds daily. He denies loss of grip strength. He also c/o intermittent chest pain and has been seen in the Ed numerous times prior for the same complaint. He denies cocaine use. He states that he gets sudden onset, nonexertional chest tightness and discomfort. He denies associated nausea, diaphoresis, or shortness of breath. He states that his pain is worse with movement of the arm in certain position, especially extension and abduction of the left shoulder. Cardiac risk factors include smoking, obesity. He is concerned because he had an uncle who died of a heart attack in his 36s.   History reviewed. No pertinent past medical history. History reviewed. No  pertinent past surgical history. Family History  Problem Relation Age of Onset  . Cancer Father   . Diabetes Father   . Heart attack Other    History  Substance Use Topics  . Smoking status: Current Some Day Smoker -- 0.00 packs/day    Types: Cigars  . Smokeless tobacco: Not on file  . Alcohol Use: No     Comment: social    Review of Systems  Ten systems reviewed and are negative for acute change, except as noted in the HPI.    Allergies  Red dye and Yellow dyes (non-tartrazine)  Home Medications   Prior to Admission medications   Medication Sig Start Date End Date Taking? Authorizing Provider  aspirin EC 81 MG tablet Take 81 mg by mouth every morning. 09/20/14 09/20/15 Yes Historical Provider, MD  ibuprofen (ADVIL,MOTRIN) 800 MG tablet Take 1 tablet (800 mg total) by mouth 3 (three) times daily. 09/18/14  Yes Lyndal Pulley, MD  loratadine (CLARITIN) 10 MG tablet Take 10 mg by mouth daily as needed for allergies.   Yes Historical Provider, MD  meclizine (ANTIVERT) 12.5 MG tablet Take 1 tablet (12.5 mg total) by mouth 3 (three) times daily as needed for dizziness. 09/18/14  Yes Lyndal Pulley, MD  alum & mag hydroxide-simeth [facility] ADVANCED) 200-200-20 MG/5ML suspension Take 15 mLs by mouth every 6 (six) hours as needed for indigestion or heartburn. Patient not taking: Reported on 09/24/2014 09/18/14   Francee Piccolo, PA-C  hydrOXYzine (ATARAX/VISTARIL) 25 MG tablet Take 1 tablet (25 mg total) by mouth  every 8 (eight) hours as needed for anxiety. 09/18/14   Kristen N Ward, DO  polyethylene glycol powder (MIRALAX) powder Take 255 g by mouth once. 09/18/14   Lyndal Pulleyaniel Knott, MD   BP 118/69 mmHg  Pulse 60  Temp(Src) 98.1 F (36.7 C) (Oral)  Resp 18  SpO2 100% Physical Exam  Constitutional: He is oriented to person, place, and time. He appears well-developed and well-nourished. No distress.  HENT:  Head: Normocephalic and atraumatic.  Eyes: Conjunctivae are normal. No scleral icterus.   Neck: Normal range of motion. Neck supple.  Cardiovascular: Normal rate, regular rhythm and normal heart sounds.   Strong radial and brachial pulses bilaterally.   Pulmonary/Chest: Effort normal and breath sounds normal. No respiratory distress.  Abdominal: Soft. There is no tenderness.  Musculoskeletal: He exhibits no edema.  Neurological: He is alert and oriented to person, place, and time.  Right hand with strong grip is equal bilaterally. Reported sensation difference. Full strength of the proximal muscle groups. Full range of motion. Normal deep tendon reflexes.  Skin: Skin is warm and dry. He is not diaphoretic.  Psychiatric: His behavior is normal.  Nursing note and vitals reviewed.   ED Course  Procedures (including critical care time) Labs Review Labs Reviewed - No data to display  Imaging Review No results found.   EKG Interpretation None      ED ECG REPORT   Date: 09/24/2014  Rate: 56  Rhythm: normal sinus rhythm  QRS Axis: normal  Intervals: normal  ST/T Wave abnormalities: normal  Conduction Disutrbances:none  Narrative Interpretation:   Old EKG Reviewed: unchanged  I have personally reviewed the EKG tracing and agree with the computerized printout as noted.  MDM   Final diagnoses:  Paresthesia of right arm  Chest pain with low risk for cardiac etiology    Normal EKG. Minimal risk factors. He has had previous workups here in the ED, which are negative. He has not had any chest pain in the past 48 hours. There does not appear to be any life or limb threatening cause of his right arm paresthesia which has improved with time. I doubt arterial compromise, venous clot,. There is no swelling. No tenderness. No signs of infection. Patient seen in shared visit with Dr. Mora BellmanNI. We'll discharge to follow-up with orthopedics as well as cardiology for stress test. Discussed reasons to seek immediate medical care in the emergency department. Patient appears safe for  discharge at this time    Arthor Captainbigail Lauralye Kinn, PA-C 09/24/14 [id number]  Tomasita CrumbleAdeleke Oni, MD 09/24/14 1430

## 2014-09-24 NOTE — Discharge Instructions (Signed)
Your arm symptoms do not appear to be life or limb threatening today.  If your symptoms continue, please follow up with an orthopedist for further evaluation. Please consult with cardiology for a cardiac stress test.  Your caregiver has diagnosed you as having chest pain that is not specific for one problem, but does not require admission.  You are at low risk for an acute heart condition or other serious illness. Chest pain comes from many different causes.  SEEK IMMEDIATE MEDICAL ATTENTION IF: You have severe chest pain, especially if the pain is crushing or pressure-like and spreads to the arms, back, neck, or jaw, or if you have sweating, nausea (feeling sick to your stomach), or shortness of breath. THIS IS AN EMERGENCY. Don't wait to see if the pain will go away. Get medical help at once. Call 911 or 0 (operator). DO NOT drive yourself to the hospital.  Your chest pain gets worse and does not go away with rest.  You have an attack of chest pain lasting longer than usual, despite rest and treatment with the medications your caregiver has prescribed.  You wake from sleep with chest pain or shortness of breath.  You feel dizzy or faint.  You have chest pain not typical of your usual pain for which you originally saw your caregiver.  Paresthesia Paresthesia is an abnormal burning or prickling sensation. This sensation is generally felt in the hands, arms, legs, or feet. However, it may occur in any part of the body. It is usually not painful. The feeling may be described as:  Tingling or numbness.  "Pins and needles."  Skin crawling.  Buzzing.  Limbs "falling asleep."  Itching. Most people experience temporary (transient) paresthesia at some time in their lives. CAUSES  Paresthesia may occur when you breathe too quickly (hyperventilation). It can also occur without any apparent cause. Commonly, paresthesia occurs when pressure is placed on a nerve. The feeling quickly goes away once  the pressure is removed. For some people, however, paresthesia is a long-lasting (chronic) condition caused by an underlying disorder. The underlying disorder may be:  A traumatic, direct injury to nerves. Examples include a:  Broken (fractured) neck.  Fractured skull.  A disorder affecting the brain and spinal cord (central nervous system). Examples include:  Transverse myelitis.  Encephalitis.  Transient ischemic attack.  Multiple sclerosis.  Stroke.  Tumor or blood vessel problems, such as an arteriovenous malformation pressing against the brain or spinal cord.  A condition that damages the peripheral nerves (peripheral neuropathy). Peripheral nerves are not part of the brain and spinal cord. These conditions include:  Diabetes.  Peripheral vascular disease.  Nerve entrapment syndromes, such as carpal tunnel syndrome.  Shingles.  Hypothyroidism.  Vitamin B12 deficiencies.  Alcoholism.  Heavy metal poisoning (lead, arsenic).  Rheumatoid arthritis.  Systemic lupus erythematosus. DIAGNOSIS  Your caregiver will attempt to find the underlying cause of your paresthesia. Your caregiver may:  Take your medical history.  Perform a physical exam.  Order various lab tests.  Order imaging tests. TREATMENT  Treatment for paresthesia depends on the underlying cause. HOME CARE INSTRUCTIONS  Avoid drinking alcohol.  You may consider massage or acupuncture to help relieve your symptoms.  Keep all follow-up appointments as directed by your caregiver. SEEK IMMEDIATE MEDICAL CARE IF:   You feel weak.  You have trouble walking or moving.  You have problems with speech or vision.  You feel confused.  You cannot control your bladder or bowel movements.  You  feel numbness after an injury.  You faint.  Your burning or prickling feeling gets worse when walking.  You have pain, cramps, or dizziness.  You develop a rash. MAKE SURE YOU:  Understand these  instructions.  Will watch your condition.  Will get help right away if you are not doing well or get worse. Document Released: 03/25/2002 Document Revised: 06/27/2011 Document Reviewed: 12/24/2010 [facility] Patient Information 2015 [place]. This information is not intended to replace advice given to you by your health care provider. Make sure you discuss any questions you have with your health care provider.

## 2014-09-24 NOTE — ED Notes (Signed)
Pt is c/o sharp pains in his right arm  Pt states he was at home on the couch when his right arm went numb and now he says he has sharp pains in it  Pt states he had the same not long ago with the left arm  Radial pulse present and strong  CMS checks wnl

## 2014-09-25 ENCOUNTER — Encounter (HOSPITAL_COMMUNITY): Payer: Self-pay | Admitting: Emergency Medicine

## 2014-09-25 ENCOUNTER — Emergency Department (HOSPITAL_COMMUNITY): Payer: Self-pay

## 2014-09-25 ENCOUNTER — Emergency Department (HOSPITAL_COMMUNITY)
Admission: EM | Admit: 2014-09-25 | Discharge: 2014-09-25 | Disposition: A | Discharge status: Home / Self Care | Payer: Self-pay | Attending: Emergency Medicine | Admitting: Emergency Medicine

## 2014-09-25 DIAGNOSIS — Z7982 Long term (current) use of aspirin: Secondary | ICD-10-CM | POA: Insufficient documentation

## 2014-09-25 DIAGNOSIS — Z720 Tobacco use: Secondary | ICD-10-CM | POA: Insufficient documentation

## 2014-09-25 DIAGNOSIS — R0789 Other chest pain: Secondary | ICD-10-CM | POA: Insufficient documentation

## 2014-09-25 LAB — CBC WITH DIFFERENTIAL/PLATELET
BASOS PCT: 1 % (ref 0–1)
Basophils Absolute: 0 10*3/uL (ref 0.0–0.1)
EOS ABS: 0.2 10*3/uL (ref 0.0–0.7)
EOS PCT: 3 % (ref 0–5)
HCT: 43.2 % (ref 39.0–52.0)
Hemoglobin: 14.8 g/dL (ref 13.0–17.0)
Lymphocytes Relative: 29 % (ref 12–46)
Lymphs Abs: 2.5 10*3/uL (ref 0.7–4.0)
MCH: 29.4 pg (ref 26.0–34.0)
MCHC: 34.3 g/dL (ref 30.0–36.0)
MCV: 85.9 fL (ref 78.0–100.0)
Monocytes Absolute: 0.5 10*3/uL (ref 0.1–1.0)
Monocytes Relative: 6 % (ref 3–12)
NEUTROS ABS: 5.2 10*3/uL (ref 1.7–7.7)
NEUTROS PCT: 61 % (ref 43–77)
Platelets: 309 10*3/uL (ref 150–400)
RBC: 5.03 MIL/uL (ref 4.22–5.81)
RDW: 13.2 % (ref 11.5–15.5)
WBC: 8.4 10*3/uL (ref 4.0–10.5)

## 2014-09-25 LAB — COMPREHENSIVE METABOLIC PANEL
ALBUMIN: 4.3 g/dL (ref 3.5–5.0)
ALT: 22 U/L (ref 17–63)
AST: 21 U/L (ref 15–41)
Alkaline Phosphatase: 68 U/L (ref 38–126)
Anion gap: 7 (ref 5–15)
BUN: 9 mg/dL (ref 6–20)
CALCIUM: 9.5 mg/dL (ref 8.9–10.3)
CHLORIDE: 105 mmol/L (ref 101–111)
CO2: 26 mmol/L (ref 22–32)
Creatinine, Ser: 1.05 mg/dL (ref 0.61–1.24)
GFR calc non Af Amer: >60 mL/min (ref >60)
GLUCOSE: 104 mg/dL — AB (ref 65–99)
Potassium: 4.2 mmol/L (ref 3.5–5.1)
Sodium: 138 mmol/L (ref 135–145)
Total Bilirubin: 0.6 mg/dL (ref 0.3–1.2)
Total Protein: 7.9 g/dL (ref 6.5–8.1)

## 2014-09-25 LAB — LIPASE, BLOOD: Lipase: 23 U/L (ref 22–51)

## 2014-09-25 LAB — I-STAT TROPONIN, ED: Troponin i, poc: 0 ng/mL (ref 0.00–0.08)

## 2014-09-25 MED ORDER — LORAZEPAM 1 MG PO TABS
1.0000 mg | ORAL_TABLET | Freq: Once | ORAL | Status: AC
  Administered 2014-09-25: 1 mg via ORAL
  Filled 2014-09-25: qty 1

## 2014-09-25 MED ORDER — NAPROXEN 500 MG PO TABS: 500.0000 mg | ORAL_TABLET | Freq: Two times a day (BID) | ORAL | Status: DC

## 2014-09-25 NOTE — ED Notes (Signed)
Patient transported to CT 

## 2014-09-25 NOTE — ED Provider Notes (Signed)
CSN: [id number]     Arrival date & time 09/25/14  1311 History   First MD Initiated Contact with Patient 09/25/14 1337     Chief Complaint  Patient presents with  . Chest Pain     (Consider location/radiation/quality/duration/timing/severity/associated sxs/prior Treatment) HPI Preston [name] is a 34 y.o. male who comes in for evaluation of chest discomfort. Patient states since last Tuesday, May 31 he has had a constant pressure in his central chest. He also reports intermittent, fleeting sharp chest pains in his left chest. This discomfort is not worsened or exacerbated by anything. He does report expressing some relief with Motrin. He denies any headaches, shortness of breath, unilateral leg swelling, recent travel or surgeries, numbness or weakness, nausea or vomiting, new rashes, abdominal pain. He does report recently having stopped smoking cigars and marijuana on Tuesday, May 31. No other aggravating or modifying factors.  History reviewed. No pertinent past medical history. History reviewed. No pertinent past surgical history. Family History  Problem Relation Age of Onset  . Cancer Father   . Diabetes Father   . Heart attack Other    History  Substance Use Topics  . Smoking status: Current Some Day Smoker -- 0.00 packs/day    Types: Cigars  . Smokeless tobacco: Not on file  . Alcohol Use: No     Comment: social    Review of Systems A 10 point review of systems was completed and was negative except for pertinent positives and negatives as mentioned in the history of present illness     Allergies  Red dye and Yellow dyes (non-tartrazine)  Home Medications   Prior to Admission medications   Medication Sig Start Date End Date Taking? Authorizing Provider  aspirin EC 81 MG tablet Take 81 mg by mouth every morning. 09/20/14 09/20/15 Yes Historical Provider, MD  hydrOXYzine (ATARAX/VISTARIL) 25 MG tablet Take 1 tablet (25 mg total) by mouth every 8 (eight) hours as needed for  anxiety. 09/18/14  Yes Kristen N Ward, DO  ibuprofen (ADVIL,MOTRIN) 800 MG tablet Take 1 tablet (800 mg total) by mouth 3 (three) times daily. Patient taking differently: Take 400-800 mg by mouth 3 (three) times daily.  09/18/14  Yes Lyndal Pulley, MD  loratadine (CLARITIN) 10 MG tablet Take 10 mg by mouth daily as needed for allergies.   Yes Historical Provider, MD  meclizine (ANTIVERT) 12.5 MG tablet Take 1 tablet (12.5 mg total) by mouth 3 (three) times daily as needed for dizziness. 09/18/14  Yes Lyndal Pulley, MD  alum & mag hydroxide-simeth [facility] ADVANCED) 200-200-20 MG/5ML suspension Take 15 mLs by mouth every 6 (six) hours as needed for indigestion or heartburn. Patient not taking: Reported on 09/24/2014 09/18/14   Francee Piccolo, PA-C  naproxen (NAPROSYN) 500 MG tablet Take 1 tablet (500 mg total) by mouth 2 (two) times daily. 09/25/14   Joycie Peek, PA-C  polyethylene glycol powder (MIRALAX) powder Take 255 g by mouth once. 09/18/14   Lyndal Pulley, MD   BP 139/93 mmHg  Pulse 70  Temp(Src) 97.8 F (36.6 C) (Oral)  Resp 18  SpO2 98% Physical Exam  Constitutional: He is oriented to person, place, and time. He appears well-developed and well-nourished.  HENT:  Head: Normocephalic and atraumatic.  Mouth/Throat: Oropharynx is clear and moist.  Eyes: Conjunctivae are normal. Pupils are equal, round, and reactive to light. Right eye exhibits no discharge. Left eye exhibits no discharge. No scleral icterus.  Neck: Normal range of motion. Neck supple.  Cardiovascular: Normal  rate, regular rhythm and normal heart sounds.   Pulmonary/Chest: Effort normal and breath sounds normal. No respiratory distress. He has no wheezes. He has no rales.  Chest discomfort is reproduced with palpation of the left pectoralis major as well as intercostal spaces.  Abdominal: Soft. There is no tenderness.  Musculoskeletal: Normal range of motion. He exhibits no edema or tenderness.  Neurological: He is alert and  oriented to person, place, and time.  Cranial Nerves II-XII grossly intact  Skin: Skin is warm and dry. No rash noted.  Psychiatric: He has a normal mood and affect.  Nursing note and vitals reviewed.   ED Course  Procedures (including critical care time) Labs Review Labs Reviewed  COMPREHENSIVE METABOLIC PANEL - Abnormal; Notable for the following:    Glucose, Bld 104 (*)    All other components within normal limits  CBC WITH DIFFERENTIAL/PLATELET  LIPASE, BLOOD  I-STAT TROPOININ, ED    Imaging Review Dg Chest 2 View  09/25/2014   CLINICAL DATA:  Chest pain.  Initial encounter.  EXAM: CHEST  2 VIEW  COMPARISON:  09/16/2014.  FINDINGS: Cardiopericardial silhouette within normal limits. Mediastinal contours normal. Trachea midline. No airspace disease or effusion. Monitoring leads project over the chest.  IMPRESSION: No active cardiopulmonary disease.   Electronically Signed   By: Andreas Newport M.D.   On: 09/25/2014 14:51     EKG Interpretation   Date/Time:  Thursday September 25 2014 13:18:24 EDT Ventricular Rate:  78 PR Interval:  153 QRS Duration: 100 QT Interval:  410 QTC Calculation: 467 R Axis:   61 Text Interpretation:  Sinus rhythm Artifact Confirmed by Lincoln Brigham [phone])  on 09/25/2014 1:49:42 PM     Meds given in ED:  Medications  LORazepam (ATIVAN) tablet 1 mg (1 mg Oral Given 09/25/14 1441)    Discharge Medication List as of 09/25/2014  3:06 PM    START taking these medications   Details  naproxen (NAPROSYN) 500 MG tablet Take 1 tablet (500 mg total) by mouth 2 (two) times daily., Starting 09/25/2014, Until Discontinued, Print       Filed Vitals:   09/25/14 1335 09/25/14 1400 09/25/14 1430 09/25/14 1539  BP: 145/80 144/87 122/54 139/93  Pulse: 65 64 60 70  Temp:    97.8 F (36.6 C)  TempSrc:    Oral  Resp: SpO2: 98% 96% 97% 98%    MDM  Vitals stable - WNL -afebrile Pt resting comfortably in ED. chest discomfort improving in ED after  menstruation of Ativan. PE--Lung exam is benign. Cardiac auscultation reveals no murmurs rubs or gallops. Grossly Benign Physical Exam Labwork: Initial/DeltaTroponin negative. EKG reassuring. Labs otherwise noncontributory Imaging: CXR shows  DDX: Patient with chest discomfort likely due to MSK/anxiety. Discomfort is not exertional. Clinical picture and exam today not consistent with ACS/dissection. Heart score 2 due to risk factors (smoking and obesity). No evidence of spontaneous pneumothorax, esophageal rupture or other mediastinitis. PERC negative, doubt PE. No evidence of myocarditis, endocarditis, pericarditis.  Will DC with naproxen. Patient states he has Tylenol at home that he will be able to use. I discussed all relevant lab findings and imaging results with pt and they verbalized understanding. Discussed f/u with PCP within 48 hrs and return precautions, pt very amenable to plan.   Final diagnoses:  Chest discomfort      Joycie Peek, PA-C 09/25/14 1553  Tilden Fossa, MD 09/26/14 1151

## 2014-09-25 NOTE — ED Notes (Signed)
Pt c/o mid to left side chest pain that been going on the past week.  Pt states that pain got worse today.  Pt states that he also has left upper abd pain and nausea.

## 2014-09-25 NOTE — Discharge Instructions (Signed)
Your evaluated in the ED today for your chest discomfort. There does not appear to be an emergent cause for your symptoms at this time. It is important for you to follow-up with primary care for further evaluation and management of your symptoms. Return to ED for worsening symptoms. Please take your medications as prescribed.  Chest Pain (Nonspecific) It is often hard to give a specific diagnosis for the cause of chest pain. There is always a chance that your pain could be related to something serious, such as a heart attack or a blood clot in the lungs. You need to follow up with your health care provider for further evaluation. CAUSES   Heartburn.  Pneumonia or bronchitis.  Anxiety or stress.  Inflammation around your heart (pericarditis) or lung (pleuritis or pleurisy).  A blood clot in the lung.  A collapsed lung (pneumothorax). It can develop suddenly on its own (spontaneous pneumothorax) or from trauma to the chest.  Shingles infection (herpes zoster virus). The chest wall is composed of bones, muscles, and cartilage. Any of these can be the source of the pain.  The bones can be bruised by injury.  The muscles or cartilage can be strained by coughing or overwork.  The cartilage can be affected by inflammation and become sore (costochondritis). DIAGNOSIS  Lab tests or other studies may be needed to find the cause of your pain. Your health care provider may have you take a test called an ambulatory electrocardiogram (ECG). An ECG records your heartbeat patterns over a 24-hour period. You may also have other tests, such as:  Transthoracic echocardiogram (TTE). During echocardiography, sound waves are used to evaluate how blood flows through your heart.  Transesophageal echocardiogram (TEE).  Cardiac monitoring. This allows your health care provider to monitor your heart rate and rhythm in real time.  Holter monitor. This is a portable device that records your heartbeat and can  help diagnose heart arrhythmias. It allows your health care provider to track your heart activity for several days, if needed.  Stress tests by exercise or by giving medicine that makes the heart beat faster. TREATMENT   Treatment depends on what may be causing your chest pain. Treatment may include:  Acid blockers for heartburn.  Anti-inflammatory medicine.  Pain medicine for inflammatory conditions.  Antibiotics if an infection is present.  You may be advised to change lifestyle habits. This includes stopping smoking and avoiding alcohol, caffeine, and chocolate.  You may be advised to keep your head raised (elevated) when sleeping. This reduces the chance of acid going backward from your stomach into your esophagus. Most of the time, nonspecific chest pain will improve within 2-3 days with rest and mild pain medicine.  HOME CARE INSTRUCTIONS   If antibiotics were prescribed, take them as directed. Finish them even if you start to feel better.  For the next few days, avoid physical activities that bring on chest pain. Continue physical activities as directed.  Do not use any tobacco products, including cigarettes, chewing tobacco, or electronic cigarettes.  Avoid drinking alcohol.  Only take medicine as directed by your health care provider.  Follow your health care provider's suggestions for further testing if your chest pain does not go away.  Keep any follow-up appointments you made. If you do not go to an appointment, you could develop lasting (chronic) problems with pain. If there is any problem keeping an appointment, call to reschedule. SEEK MEDICAL CARE IF:   Your chest pain does not go away, even  after treatment.  You have a rash with blisters on your chest.  You have a fever. SEEK IMMEDIATE MEDICAL CARE IF:   You have increased chest pain or pain that spreads to your arm, neck, jaw, back, or abdomen.  You have shortness of breath.  You have an increasing  cough, or you cough up blood.  You have severe back or abdominal pain.  You feel nauseous or vomit.  You have severe weakness.  You faint.  You have chills. This is an emergency. Do not wait to see if the pain will go away. Get medical help at once. Call your local emergency services (911 in [place] Do not drive yourself to the hospital. MAKE SURE YOU:   Understand these instructions.  Will watch your condition.  Will get help right away if you are not doing well or get worse. Document Released: 01/12/2005 Document Revised: 04/09/2013 Document Reviewed: 11/08/2007 [facility] Patient Information 2015 [place], [place]. This information is not intended to replace advice given to you by your health care provider. Make sure you discuss any questions you have with your health care provider.

## 2014-09-28 ENCOUNTER — Emergency Department (HOSPITAL_COMMUNITY)
Admission: EM | Admit: 2014-09-28 | Discharge: 2014-09-28 | Disposition: A | Discharge status: Home / Self Care | Payer: Self-pay | Attending: Emergency Medicine | Admitting: Emergency Medicine

## 2014-09-28 ENCOUNTER — Emergency Department (HOSPITAL_COMMUNITY): Payer: Self-pay

## 2014-09-28 ENCOUNTER — Encounter (HOSPITAL_COMMUNITY): Payer: Self-pay

## 2014-09-28 DIAGNOSIS — Z7982 Long term (current) use of aspirin: Secondary | ICD-10-CM | POA: Insufficient documentation

## 2014-09-28 DIAGNOSIS — R079 Chest pain, unspecified: Secondary | ICD-10-CM

## 2014-09-28 DIAGNOSIS — R0789 Other chest pain: Secondary | ICD-10-CM | POA: Insufficient documentation

## 2014-09-28 DIAGNOSIS — Z791 Long term (current) use of non-steroidal anti-inflammatories (NSAID): Secondary | ICD-10-CM | POA: Insufficient documentation

## 2014-09-28 DIAGNOSIS — R202 Paresthesia of skin: Secondary | ICD-10-CM | POA: Insufficient documentation

## 2014-09-28 DIAGNOSIS — Q211 Atrial septal defect: Secondary | ICD-10-CM | POA: Insufficient documentation

## 2014-09-28 DIAGNOSIS — R0602 Shortness of breath: Secondary | ICD-10-CM | POA: Insufficient documentation

## 2014-09-28 DIAGNOSIS — Z720 Tobacco use: Secondary | ICD-10-CM | POA: Insufficient documentation

## 2014-09-28 DIAGNOSIS — R42 Dizziness and giddiness: Secondary | ICD-10-CM | POA: Insufficient documentation

## 2014-09-28 LAB — CBC WITH DIFFERENTIAL/PLATELET
BASOS ABS: 0 10*3/uL (ref 0.0–0.1)
BASOS PCT: 0 % (ref 0–1)
EOS ABS: 0.3 10*3/uL (ref 0.0–0.7)
Eosinophils Relative: 3 % (ref 0–5)
HEMATOCRIT: 41.3 % (ref 39.0–52.0)
HEMOGLOBIN: 14.1 g/dL (ref 13.0–17.0)
Lymphocytes Relative: 30 % (ref 12–46)
Lymphs Abs: 2.9 10*3/uL (ref 0.7–4.0)
MCH: 29.4 pg (ref 26.0–34.0)
MCHC: 34.1 g/dL (ref 30.0–36.0)
MCV: 86 fL (ref 78.0–100.0)
Monocytes Absolute: 0.6 10*3/uL (ref 0.1–1.0)
Monocytes Relative: 6 % (ref 3–12)
NEUTROS ABS: 5.9 10*3/uL (ref 1.7–7.7)
Neutrophils Relative %: 61 % (ref 43–77)
Platelets: 304 10*3/uL (ref 150–400)
RBC: 4.8 MIL/uL (ref 4.22–5.81)
RDW: 13.2 % (ref 11.5–15.5)
WBC: 9.7 10*3/uL (ref 4.0–10.5)

## 2014-09-28 LAB — BASIC METABOLIC PANEL
ANION GAP: 7 (ref 5–15)
BUN: 8 mg/dL (ref 6–20)
CO2: 26 mmol/L (ref 22–32)
CREATININE: 1.01 mg/dL (ref 0.61–1.24)
Calcium: 9.3 mg/dL (ref 8.9–10.3)
Chloride: 105 mmol/L (ref 101–111)
GFR calc Af Amer: >60 mL/min (ref >60)
GFR calc non Af Amer: >60 mL/min (ref >60)
Glucose, Bld: 98 mg/dL (ref 65–99)
Potassium: 4.1 mmol/L (ref 3.5–5.1)
Sodium: 138 mmol/L (ref 135–145)

## 2014-09-28 LAB — I-STAT TROPONIN, ED: Troponin i, poc: 0 ng/mL (ref 0.00–0.08)

## 2014-09-28 LAB — D-DIMER, QUANTITATIVE (NOT AT ARMC): D-Dimer, Quant: <0.27 ug/mL-FEU (ref 0.00–0.48)

## 2014-09-28 NOTE — ED Notes (Signed)
Family at bedside. 

## 2014-09-28 NOTE — ED Notes (Signed)
C/o numbness and tingling of upper and lower extremities.  Cannot relate this to any type of activity.  Nothing makes it better or worse.  States it occurs at least once per day.

## 2014-09-28 NOTE — ED Notes (Signed)
Patient was seen 3 days for the same. Dizziness, tingling both arms and left leg. Patient states he was prescribed medication for inflammation and chest not hurting now, but continues to have the same symptoms.

## 2014-09-28 NOTE — ED Provider Notes (Signed)
CSN: [id number]     Arrival date & time 09/28/14  1400 History   First MD Initiated Contact with Patient 09/28/14 1711     Chief Complaint  Patient presents with  . tingling arms      (Consider location/radiation/quality/duration/timing/severity/associated sxs/prior Treatment) HPI Comments: Patient with PMH of ASD presents to the emergency department with chief complaint of intermittent chest pain, dizziness, arm tingling, and blood-tinged sputum for the past week. He states that he has been seen before for the same. He was told that he should follow-up with his primary care doctor. Patient states that his symptoms been persistent, and that he has not been able to see a primary care doctor. He states that he has intermittent shortness of breath.  Denies any history of DVT/PE.  Denies any recent travel, or surgery.  The history is provided by the patient. No language interpreter was used.    History reviewed. No pertinent past medical history. History reviewed. No pertinent past surgical history. Family History  Problem Relation Age of Onset  . Cancer Father   . Diabetes Father   . Heart attack Other    History  Substance Use Topics  . Smoking status: Current Some Day Smoker -- 0.00 packs/day    Types: Cigars  . Smokeless tobacco: Not on file  . Alcohol Use: No     Comment: social    Review of Systems  Constitutional: Negative for fever and chills.  Respiratory: Positive for shortness of breath.   Cardiovascular: Positive for chest pain.  Gastrointestinal: Negative for nausea, vomiting, diarrhea and constipation.  Genitourinary: Negative for dysuria.  Neurological: Positive for dizziness and numbness.  All other systems reviewed and are negative.     Allergies  Red dye and Yellow dyes (non-tartrazine)  Home Medications   Prior to Admission medications   Medication Sig Start Date End Date Taking? Authorizing Provider  aspirin EC 81 MG tablet Take 81 mg by mouth every  morning. 09/20/14 09/20/15 Yes Historical Provider, MD  loratadine (CLARITIN) 10 MG tablet Take 10 mg by mouth daily as needed for allergies.   Yes Historical Provider, MD  meclizine (ANTIVERT) 12.5 MG tablet Take 1 tablet (12.5 mg total) by mouth 3 (three) times daily as needed for dizziness. 09/18/14  Yes Lyndal Pulley, MD  naproxen (NAPROSYN) 500 MG tablet Take 1 tablet (500 mg total) by mouth 2 (two) times daily. 09/25/14  Yes Joycie Peek, PA-C  alum & mag hydroxide-simeth (MAALOX ADVANCED) 200-200-20 MG/5ML suspension Take 15 mLs by mouth every 6 (six) hours as needed for indigestion or heartburn. Patient not taking: Reported on 09/24/2014 09/18/14   Francee Piccolo, PA-C  hydrOXYzine (ATARAX/VISTARIL) 25 MG tablet Take 1 tablet (25 mg total) by mouth every 8 (eight) hours as needed for anxiety. Patient not taking: Reported on 09/28/2014 09/18/14   Layla Maw Ward, DO  ibuprofen (ADVIL,MOTRIN) 800 MG tablet Take 1 tablet (800 mg total) by mouth 3 (three) times daily. Patient taking differently: Take 400-800 mg by mouth 3 (three) times daily.  09/18/14   Lyndal Pulley, MD   BP 137/80 mmHg  Pulse 69  Temp(Src) 98.1 F (36.7 C) (Oral)  Resp 18  Ht  (1.956 m)  Wt 324 lb (146.965 kg)  BMI 38.41 kg/m2  SpO2 97% Physical Exam  Constitutional: He is oriented to person, place, and time. He appears well-developed and well-nourished.  HENT:  Head: Normocephalic and atraumatic.  Eyes: Conjunctivae and EOM are normal. Pupils are equal, round, and  reactive to light. Right eye exhibits no discharge. Left eye exhibits no discharge. No scleral icterus.  Neck: Normal range of motion. Neck supple. No JVD present.  Cardiovascular: Normal rate, regular rhythm, normal heart sounds and intact distal pulses.  Exam reveals no gallop and no friction rub.   No murmur heard. Intact distal pulses  Pulmonary/Chest: Effort normal and breath sounds normal. No respiratory distress. He has no wheezes. He has no rales. He  exhibits no tenderness.  Abdominal: Soft. He exhibits no distension and no mass. There is no tenderness. There is no rebound and no guarding.  Musculoskeletal: Normal range of motion. He exhibits no edema or tenderness.  Moves all extremities, normal range of motion and strength of upper and lower extremities  Neurological: He is alert and oriented to person, place, and time.  Sensation and strength intact of upper and lower extremities  Skin: Skin is warm and dry.  Psychiatric: He has a normal mood and affect. His behavior is normal. Judgment and thought content normal.  Nursing note and vitals reviewed.   ED Course  Procedures (including critical care time) Results for orders placed or performed during the hospital encounter of 09/28/14  CBC with Differential/Platelet  Result Value Ref Range   WBC 9.7 4.0 - 10.5 K/uL   RBC 4.80 4.22 - 5.81 MIL/uL   Hemoglobin 14.1 13.0 - 17.0 g/dL   HCT 31.1 21.6 - 24.4 %   MCV 86.0 78.0 - 100.0 fL   MCH 29.4 26.0 - 34.0 pg   MCHC 34.1 30.0 - 36.0 g/dL   RDW 69.5 07.2 - 25.7 %   Platelets 304 150 - 400 K/uL   Neutrophils Relative % 61 43 - 77 %   Neutro Abs 5.9 1.7 - 7.7 K/uL   Lymphocytes Relative 30 12 - 46 %   Lymphs Abs 2.9 0.7 - 4.0 K/uL   Monocytes Relative 6 3 - 12 %   Monocytes Absolute 0.6 0.1 - 1.0 K/uL   Eosinophils Relative 3 0 - 5 %   Eosinophils Absolute 0.3 0.0 - 0.7 K/uL   Basophils Relative 0 0 - 1 %   Basophils Absolute 0.0 0.0 - 0.1 K/uL  Basic metabolic panel  Result Value Ref Range   Sodium 138 135 - 145 mmol/L   Potassium 4.1 3.5 - 5.1 mmol/L   Chloride 105 101 - 111 mmol/L   CO2 26 22 - 32 mmol/L   Glucose, Bld 98 65 - 99 mg/dL   BUN 8 6 - 20 mg/dL   Creatinine, Ser 5.05 0.61 - 1.24 mg/dL   Calcium 9.3 8.9 - 18.3 mg/dL   GFR calc non Af Amer >60 >60 mL/min   GFR calc Af Amer >60 >60 mL/min   Anion gap 7 5 - 15  D-dimer, quantitative (not at [facility])  Result Value Ref Range   D-Dimer, Quant <0.27 0.00 - 0.48  ug/mL-FEU  I-Stat Troponin, ED (not at [facility])  Result Value Ref Range   Troponin i, poc 0.00 0.00 - 0.08 ng/mL   Comment 3           Dg Chest 2 View  09/28/2014   CLINICAL DATA:  Dizziness, left-sided chest pain, and tingling in the arms in left leg. Former smoker.  EXAM: CHEST  2 VIEW  COMPARISON:  09/25/2014  FINDINGS: The cardiomediastinal silhouette is within normal limits. The lungs are well inflated and clear. There is no evidence of pleural effusion or pneumothorax. No acute osseous abnormality is  identified.  IMPRESSION: No active cardiopulmonary disease.   Electronically Signed   By: Sebastian Ache   On: 09/28/2014 18:20   Dg Chest 2 View  09/25/2014   CLINICAL DATA:  Chest pain.  Initial encounter.  EXAM: CHEST  2 VIEW  COMPARISON:  09/16/2014.  FINDINGS: Cardiopericardial silhouette within normal limits. Mediastinal contours normal. Trachea midline. No airspace disease or effusion. Monitoring leads project over the chest.  IMPRESSION: No active cardiopulmonary disease.   Electronically Signed   By: Andreas Newport M.D.   On: 09/25/2014 14:51   Dg Chest 2 View  09/16/2014   CLINICAL DATA:  Left chest pain today.  EXAM: CHEST  2 VIEW  COMPARISON:  None.  FINDINGS: The cardiomediastinal silhouette is unremarkable.  There is no evidence of focal airspace disease, pulmonary edema, suspicious pulmonary nodule/mass, pleural effusion, or pneumothorax. No acute bony abnormalities are identified.  IMPRESSION: No active cardiopulmonary disease.   Electronically Signed   By: Harmon Pier M.D.   On: 09/16/2014 18:20      EKG Interpretation None     ED ECG REPORT  I personally interpreted this EKG   Date: 09/28/2014   Rate: 51  Rhythm: sinus bradycardia  QRS Axis: normal  Intervals: normal  ST/T Wave abnormalities: normal  Conduction Disutrbances:none  Narrative Interpretation:   Old EKG Reviewed: unchanged   MDM   Final diagnoses:  Chest pain  Paresthesia  SOB (shortness of breath)     Patient with chest pain, shortness of breath, and dizziness which have been intermittent for the past 12 days. Also reports upper extremity paresthesias. He has been seen 4 times for the same complaint. In each visit, he has been reassured, discharged to home with outpatient follow-up. Repeat testing today shows a negative troponin, reassuring EKG, negative chest x-ray, and a negative d-dimer today. Doubt PE.   Paresthesias in the context of otherwise young healthy male and chest pain/shortness of breath/dizziness, could be signs of a dissection, however I feel this is extremely unlikely given the d-dimer is not elevated, and the patient is currently pain and symptom-free. I have discussed the case in detail with Dr. Jodi Mourning, who agrees that the patient is low risk for a dissection or other emergent process.  Patient informed about possibility of dissection, even given low risk, and shared medical decision-making was used in caring for the patient. Patient states that he like to hold off on the CT scan at this time, but he will return if his symptoms worsen or return. Patient understands and agrees with the plan. Otherwise he is to follow-up with his cardiologist, for which she has first-time appointment on 6/24.    Roxy Horseman, PA-C 09/28/14 2016  Blane Ohara, MD 10/07/14 2209

## 2014-09-28 NOTE — Discharge Instructions (Signed)

## 2014-10-06 ENCOUNTER — Ambulatory Visit: Payer: Self-pay | Admitting: Family

## 2014-10-07 ENCOUNTER — Ambulatory Visit: Payer: Self-pay | Attending: Family Medicine | Admitting: Family Medicine

## 2014-10-07 VITALS — BP 129/80 | HR 79 | Temp 98.0°F | Resp 18 | Ht 76.0 in | Wt 320.4 lb

## 2014-10-07 DIAGNOSIS — Z87891 Personal history of nicotine dependence: Secondary | ICD-10-CM | POA: Insufficient documentation

## 2014-10-07 DIAGNOSIS — F329 Major depressive disorder, single episode, unspecified: Secondary | ICD-10-CM | POA: Insufficient documentation

## 2014-10-07 DIAGNOSIS — Z791 Long term (current) use of non-steroidal anti-inflammatories (NSAID): Secondary | ICD-10-CM | POA: Insufficient documentation

## 2014-10-07 DIAGNOSIS — F419 Anxiety disorder, unspecified: Secondary | ICD-10-CM | POA: Insufficient documentation

## 2014-10-07 DIAGNOSIS — Z7982 Long term (current) use of aspirin: Secondary | ICD-10-CM | POA: Insufficient documentation

## 2014-10-07 DIAGNOSIS — F4323 Adjustment disorder with mixed anxiety and depressed mood: Secondary | ICD-10-CM | POA: Insufficient documentation

## 2014-10-07 NOTE — Progress Notes (Signed)
Patient ID: BARTOW ZYLSTRA, male   DOB: 10-Sep-1980, 34 y.o.   MRN: [id number]   Avraham Benish, is a 34 y.o. male  NWG:[id number]  [id number]:[id number]  DOB - 09-27-1980  CC:  Chief Complaint  Patient presents with  . Hospitalization Follow-up       HPI: Markis Langland is a 34 y.o. male here today to establish medical care.He was seen recently in the ED for chest pain. No cardiac cause was found. He is being treated with maalox in case it is GERD and naaproxen in case it is musculoskeletal. His mother is here with him and between the two of them it sounds like his symtpoms might be related to anxiety. His chest pain is intermittent, last varying amounts of time, sometimes radiates down from center of chest to lower rib cage on the right, sometimes, left, when it radiates to the right, he sometimes has numbness and tingling of his right arm. He also at times experiences blurred vision. It is sometimes related to the other symptoms, sometimes not. It may happen at rest, it may be with exertion. Sometimes he does notice that it seems to happen when he is worrying about something, but not necessarily every time.He has a prescription for Atarax but has not started taking. He also has a prescription for meclizine for dizziness but it not sure if it is helping.   Allergies  Allergen Reactions  . Red Dye Itching  . Yellow Dyes (Non-Tartrazine) Itching   History reviewed. No pertinent past medical history. Current Outpatient Prescriptions on File Prior to Visit  Medication Sig Dispense Refill  . aspirin EC 81 MG tablet Take 81 mg by mouth every morning.    . loratadine (CLARITIN) 10 MG tablet Take 10 mg by mouth daily as needed for allergies.    Marland Kitchen meclizine (ANTIVERT) 12.5 MG tablet Take 1 tablet (12.5 mg total) by mouth 3 (three) times daily as needed for dizziness. 30 tablet 0  . naproxen (NAPROSYN) 500 MG tablet Take 1 tablet (500 mg total) by mouth 2 (two) times daily. 30 tablet 0  . alum & mag  hydroxide-simeth (MAALOX ADVANCED) 200-200-20 MG/5ML suspension Take 15 mLs by mouth every 6 (six) hours as needed for indigestion or heartburn. (Patient not taking: Reported on 09/24/2014) 355 mL 0  . hydrOXYzine (ATARAX/VISTARIL) 25 MG tablet Take 1 tablet (25 mg total) by mouth every 8 (eight) hours as needed for anxiety. (Patient not taking: Reported on 09/28/2014) 20 tablet 0  . ibuprofen (ADVIL,MOTRIN) 800 MG tablet Take 1 tablet (800 mg total) by mouth 3 (three) times daily. (Patient not taking: Reported on 10/07/2014) 21 tablet 0   No current facility-administered medications on file prior to visit.   Family History  Problem Relation Age of Onset  . Cancer Father   . Diabetes Father   . Heart attack Other    History   Social History  . Marital Status: Single    Spouse Name: N/A  . Number of Children: N/A  . Years of Education: N/A   Occupational History  . Not on file.   Social History Main Topics  . Smoking status: Former Smoker -- 0.00 packs/day    Types: Cigars    Quit date: 09/06/2014  . Smokeless tobacco: Not on file  . Alcohol Use: No     Comment: social  . Drug Use: 3.50 per week    Special: Marijuana     Comment: last use one month ago (10/07/14)  . Sexual  Activity: Not on file   Other Topics Concern  . Not on file   Social History Narrative    Review of Systems: Constitutional: Negative for fever, chills, appetite change, weight loss,  fatigue. HENT: Negative for ear pain, ear discharge.nose bleeds. Positive for stuffy nose related to allergies Eyes: Negative for pain, discharge, redness.Positive for itching and occasional blurred vision as mention in HPI Neck: Positive for ST off and on. Negative for pain, stiffness Respiratory: Negative for cough. Positive for shortness of breath at times  Cardiovascular: Positive for chest pain as discussed in HPI. Negative for palpitations and leg swelling. Gastrointestinal: Negative for abdominal distention. Positive  for occassional abdominal pain and nause/ Negative vomiting, diarrhea, constipations Genitourinary: Negative for dysuria, urgency, frequency, hematuria, flank pain,  Musculoskeletal: Positive for back pain joint pain, joint  Swelling (knees and ankles). Related to football injury. arthralgia and gait problem.Negative for weakness. Neurological: Negative for dizziness, tremors, seizures, syncope. Positive for light-headedness. Negative for  numbness and headaches.  Hematological: Negative for easy bruising or bleeding Psychiatric/Behavioral: Positive for depression, anxiety, decreased concentration, confusion   Objective:   Filed Vitals:   10/07/14 1031  BP: 129/80  Pulse: 79  Temp: 98 F (36.7 C)  Resp: 18    Physical Exam: Constitutional: Patient appears well-developed and well-nourished. No distress. HENT: Normocephalic, atraumatic, External right and left ear normal. Oropharynx is clear and moist.  Eyes: Conjunctivae and EOM are normal. PERRLA, no scleral icterus. Neck: Normal ROM. Neck supple. No lymphadenopathy, No thyromegaly. CVS: RRR, S1/S2 +, no murmurs, no gallops, no rubs Pulmonary: Effort and breath sounds normal, no stridor, rhonchi, wheezes, rales.  Abdominal: Soft. Normoactive BS,, no distension, tenderness, rebound or guarding.  Musculoskeletal: Normal range of motion. No edema and no tenderness.  Neuro: Alert.Normal muscle tone coordination. Non-focal Skin: Skin is warm and dry. No rash noted. Not diaphoretic. No erythema. No pallor. Psychiatric: Normal mood and affect. Behavior, judgment, thought content normal.Seems upset at the idea of needing medication for depression and/or anxiety  Lab Results  Component Value Date   WBC 9.7 09/28/2014   HGB 14.1 09/28/2014   HCT 41.3 09/28/2014   MCV 86.0 09/28/2014   PLT 304 09/28/2014   Lab Results  Component Value Date   CREATININE 1.01 09/28/2014   BUN 8 09/28/2014   NA 138 09/28/2014   K 4.1 09/28/2014   CL  105 09/28/2014   CO2 26 09/28/2014    No results found for: HGBA1C Lipid Panel  No results found for: CHOL, TRIG, HDL, CHOLHDL, VLDL, LDLCALC     Assessment and plan:   Anxiety and depression - Have given his screening for to SW/Counselor, who will call him and provide resources in community for follow-up on anxiety, depression - If he is able to address this elsewhere, he is to make an appointment in 3 months with assigned provider - If he is unable to address his anxiety and depression elsewhere, he should make an appointment with primary provider here at earliest available appointment.    The patient was given clear instructions to go to ER or return to medical center if symptoms don't improve, worsen or new problems develop. The patient verbalized understanding. The patient was told to call to get lab results if they haven't heard anything in the next week.     This note has been created with Education officer, environmental. Any transcriptional errors are unintentional.    Henrietta Hoover, MSN, FNP-BC [facility] [place], [place] [phone]   10/07/2014, 10:44 AM

## 2014-10-07 NOTE — Progress Notes (Signed)
Patient here for dizzy spells that have been occuring for over a month, intermittently.  Now occuring more frequently.  Patient states he "feels a little dizzy" today.  Patient had ECG done at hospital and reports he saw a cardiologist last week at [facility].  Patient has no complaints of pain at this time, just a little pressure on the left side of his chest.  Patient states he was diagnosed with atrial septal defect by the cardiologist.

## 2014-10-07 NOTE — Patient Instructions (Signed)
Use medications prescribed in ED as prescribed. Continue with good lifestyle you have started. Someone Asher Muir) will call you and arrange an appointment or give you information about anxiety/depression. You will be assigned a primary provider today. For regular health maintenance, make an appointment in 30 months. If unable to get in to a mental health provider, make an appointment here to discuss with primary provider as soon as appt available.

## 2014-10-09 ENCOUNTER — Encounter (HOSPITAL_COMMUNITY): Payer: Self-pay | Admitting: *Deleted

## 2014-10-09 ENCOUNTER — Emergency Department (HOSPITAL_COMMUNITY)
Admission: EM | Admit: 2014-10-09 | Discharge: 2014-10-09 | Disposition: A | Discharge status: Home / Self Care | Payer: Self-pay | Attending: Emergency Medicine | Admitting: Emergency Medicine

## 2014-10-09 DIAGNOSIS — R0789 Other chest pain: Secondary | ICD-10-CM | POA: Insufficient documentation

## 2014-10-09 DIAGNOSIS — F419 Anxiety disorder, unspecified: Secondary | ICD-10-CM | POA: Insufficient documentation

## 2014-10-09 DIAGNOSIS — Z7982 Long term (current) use of aspirin: Secondary | ICD-10-CM | POA: Insufficient documentation

## 2014-10-09 DIAGNOSIS — Z791 Long term (current) use of non-steroidal anti-inflammatories (NSAID): Secondary | ICD-10-CM | POA: Insufficient documentation

## 2014-10-09 DIAGNOSIS — Z87891 Personal history of nicotine dependence: Secondary | ICD-10-CM | POA: Insufficient documentation

## 2014-10-09 LAB — I-STAT TROPONIN, ED: Troponin i, poc: 0.01 ng/mL (ref 0.00–0.08)

## 2014-10-09 LAB — BASIC METABOLIC PANEL
Anion gap: 9 (ref 5–15)
BUN: 12 mg/dL (ref 6–20)
CO2: 24 mmol/L (ref 22–32)
CREATININE: 1.13 mg/dL (ref 0.61–1.24)
Calcium: 9.4 mg/dL (ref 8.9–10.3)
Chloride: 107 mmol/L (ref 101–111)
GFR calc Af Amer: >60 mL/min (ref >60)
GLUCOSE: 89 mg/dL (ref 65–99)
Potassium: 3.8 mmol/L (ref 3.5–5.1)
Sodium: 140 mmol/L (ref 135–145)

## 2014-10-09 LAB — CBC
HCT: 40.7 % (ref 39.0–52.0)
Hemoglobin: 13.7 g/dL (ref 13.0–17.0)
MCH: 29.1 pg (ref 26.0–34.0)
MCHC: 33.7 g/dL (ref 30.0–36.0)
MCV: 86.4 fL (ref 78.0–100.0)
Platelets: 306 10*3/uL (ref 150–400)
RBC: 4.71 MIL/uL (ref 4.22–5.81)
RDW: 13.5 % (ref 11.5–15.5)
WBC: 9.6 10*3/uL (ref 4.0–10.5)

## 2014-10-09 MED ORDER — ESOMEPRAZOLE MAGNESIUM 40 MG PO CPDR: 40.0000 mg | DELAYED_RELEASE_CAPSULE | Freq: Every day | ORAL | Status: DC

## 2014-10-09 MED ORDER — FAMOTIDINE 20 MG PO TABS
20.0000 mg | ORAL_TABLET | Freq: Once | ORAL | Status: AC
  Administered 2014-10-09: 20 mg via ORAL
  Filled 2014-10-09: qty 1

## 2014-10-09 NOTE — ED Provider Notes (Addendum)
CSN: [id number]     Arrival date & time 10/09/14  0008 History   First MD Initiated Contact with Patient 10/09/14 0120     Chief Complaint  Patient presents with  . Chest Pain     (Consider location/radiation/quality/duration/timing/severity/associated sxs/prior Treatment) The history is provided by the patient.  Preston [name] is a 34 y.o. male hx of depression, anxiety here with persistent chest pain. Has been having chest pain over the last month or so. Has been in the ER multiple times and most recently was 2 days ago. States that he has intermittent chest pain especially worse at night. Today he ate something and went to bed and woke up with epigastric pain and chest pain. Denies any shortness of breath. No history of reflux or stomach ulcers. He does have primary care follow-up on July 1. Denies any family history of CAD.   Past Medical History  Diagnosis Date  . Depression   . Anxiety    Past Surgical History  Procedure Laterality Date  . No past surgeries     Family History  Problem Relation Age of Onset  . Cancer Father   . Diabetes Father   . Heart attack Other    History  Substance Use Topics  . Smoking status: Former Smoker -- 0.00 packs/day    Types: Cigars    Quit date: 09/06/2014  . Smokeless tobacco: Not on file  . Alcohol Use: No     Comment: social    Review of Systems  Cardiovascular: Positive for chest pain.  All other systems reviewed and are negative.     Allergies  Red dye and Yellow dyes (non-tartrazine)  Home Medications   Prior to Admission medications   Medication Sig Start Date End Date Taking? Authorizing Provider  aspirin EC 81 MG tablet Take 81 mg by mouth every morning. 09/20/14 09/20/15 Yes Historical Provider, MD  loratadine (CLARITIN) 10 MG tablet Take 10 mg by mouth daily as needed for allergies.   Yes Historical Provider, MD  meclizine (ANTIVERT) 12.5 MG tablet Take 1 tablet (12.5 mg total) by mouth 3 (three) times daily as  needed for dizziness. 09/18/14  Yes Lyndal Pulley, MD  naproxen sodium (ANAPROX) 220 MG tablet Take 220 mg by mouth 2 (two) times daily with a meal.   Yes Historical Provider, MD  alum & mag hydroxide-simeth (MAALOX ADVANCED) 200-200-20 MG/5ML suspension Take 15 mLs by mouth every 6 (six) hours as needed for indigestion or heartburn. Patient not taking: Reported on 09/24/2014 09/18/14   Francee Piccolo, PA-C  hydrOXYzine (ATARAX/VISTARIL) 25 MG tablet Take 1 tablet (25 mg total) by mouth every 8 (eight) hours as needed for anxiety. Patient not taking: Reported on 09/28/2014 09/18/14   Layla Maw Ward, DO  ibuprofen (ADVIL,MOTRIN) 800 MG tablet Take 1 tablet (800 mg total) by mouth 3 (three) times daily. Patient not taking: Reported on 10/09/2014 09/18/14   Lyndal Pulley, MD  naproxen (NAPROSYN) 500 MG tablet Take 1 tablet (500 mg total) by mouth 2 (two) times daily. Patient not taking: Reported on 10/09/2014 09/25/14   Joycie Peek, PA-C   BP 128/62 mmHg  Pulse 67  Temp(Src) 97.6 F (36.4 C) (Oral)  Resp 15  SpO2 98% Physical Exam  Constitutional: He is oriented to person, place, and time.  Slightly anxious   HENT:  Head: Normocephalic.  Mouth/Throat: Oropharynx is clear and moist.  Eyes: Conjunctivae are normal. Pupils are equal, round, and reactive to light.  Neck: Normal range of  motion. Neck supple.  Cardiovascular: Normal rate, regular rhythm and normal heart sounds.   Pulmonary/Chest: Effort normal and breath sounds normal. No respiratory distress. He has no wheezes. He has no rales.  Abdominal: Soft. Bowel sounds are normal. He exhibits no distension. There is no tenderness. There is no rebound.  Musculoskeletal: Normal range of motion. He exhibits no edema or tenderness.  Neurological: He is alert and oriented to person, place, and time. No cranial nerve deficit. Coordination normal.  Skin: Skin is warm and dry.  Psychiatric: He has a normal mood and affect. His behavior is normal.  Judgment and thought content normal.  Nursing note and vitals reviewed.   ED Course  Procedures (including critical care time) Labs Review Labs Reviewed  CBC  BASIC METABOLIC PANEL  I-STAT TROPOININ, ED    Imaging Review No results found.   EKG Interpretation   Date/Time:  Thursday October 09 2014 00:20:55 EDT Ventricular Rate:  67 PR Interval:  187 QRS Duration: 96 QT Interval:  412 QTC Calculation: 435 R Axis:   58 Text Interpretation:  Sinus rhythm No significant change since last  tracing Confirmed by Javad Salva  MD, Jasmine Mcbeth ([id number]) on 10/09/2014 12:47:21 AM      MDM   Final diagnoses:  None   Preston [name] is a 34 y.o. male here with atypical chest pain. Had neg d-dimer several days ago. Intermittent for a month and trop neg today. He is taking naprosyn for pain so I think likely reflux vs anxiety. Will start nexium and have him see PCP next week.       Richardean Canal, MD 10/09/14 [id number]  Richardean Canal, MD 10/09/14 [phone]

## 2014-10-09 NOTE — Discharge Instructions (Signed)
Take nexium daily.   See your primary care doctor.   Continue tylenol, naprosyn for pain.   Return to ER if you have severe chest pain, shortness of breath.

## 2014-10-09 NOTE — ED Notes (Signed)
Pt reports intermittent L cp x 1 month and has been seen here for same Wednesday.  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  Reports he is not able to go to sleep d/t pain tonight.  Pt also c/o sore throat.

## 2014-10-10 ENCOUNTER — Emergency Department (HOSPITAL_COMMUNITY): Payer: Self-pay

## 2014-10-10 ENCOUNTER — Encounter (HOSPITAL_COMMUNITY): Payer: Self-pay | Admitting: Emergency Medicine

## 2014-10-10 ENCOUNTER — Emergency Department (HOSPITAL_COMMUNITY)
Admission: EM | Admit: 2014-10-10 | Discharge: 2014-10-10 | Disposition: A | Discharge status: Home / Self Care | Payer: Self-pay | Attending: Emergency Medicine | Admitting: Emergency Medicine

## 2014-10-10 DIAGNOSIS — F329 Major depressive disorder, single episode, unspecified: Secondary | ICD-10-CM | POA: Insufficient documentation

## 2014-10-10 DIAGNOSIS — R0789 Other chest pain: Secondary | ICD-10-CM | POA: Insufficient documentation

## 2014-10-10 DIAGNOSIS — Z7982 Long term (current) use of aspirin: Secondary | ICD-10-CM | POA: Insufficient documentation

## 2014-10-10 DIAGNOSIS — Z79899 Other long term (current) drug therapy: Secondary | ICD-10-CM | POA: Insufficient documentation

## 2014-10-10 DIAGNOSIS — F419 Anxiety disorder, unspecified: Secondary | ICD-10-CM | POA: Insufficient documentation

## 2014-10-10 DIAGNOSIS — Z791 Long term (current) use of non-steroidal anti-inflammatories (NSAID): Secondary | ICD-10-CM | POA: Insufficient documentation

## 2014-10-10 DIAGNOSIS — Z87891 Personal history of nicotine dependence: Secondary | ICD-10-CM | POA: Insufficient documentation

## 2014-10-10 LAB — BASIC METABOLIC PANEL
ANION GAP: 8 (ref 5–15)
BUN: 9 mg/dL (ref 6–20)
CALCIUM: 9.3 mg/dL (ref 8.9–10.3)
CO2: 24 mmol/L (ref 22–32)
Chloride: 104 mmol/L (ref 101–111)
Creatinine, Ser: 1.1 mg/dL (ref 0.61–1.24)
GFR calc Af Amer: >60 mL/min (ref >60)
Glucose, Bld: 108 mg/dL — ABNORMAL HIGH (ref 65–99)
Potassium: 4 mmol/L (ref 3.5–5.1)
SODIUM: 136 mmol/L (ref 135–145)

## 2014-10-10 LAB — CBC
HCT: 40.2 % (ref 39.0–52.0)
HEMOGLOBIN: 13.9 g/dL (ref 13.0–17.0)
MCH: 29.2 pg (ref 26.0–34.0)
MCHC: 34.6 g/dL (ref 30.0–36.0)
MCV: 84.5 fL (ref 78.0–100.0)
Platelets: 285 10*3/uL (ref 150–400)
RBC: 4.76 MIL/uL (ref 4.22–5.81)
RDW: 13.4 % (ref 11.5–15.5)
WBC: 9 10*3/uL (ref 4.0–10.5)

## 2014-10-10 LAB — I-STAT TROPONIN, ED: TROPONIN I, POC: 0 ng/mL (ref 0.00–0.08)

## 2014-10-10 LAB — BRAIN NATRIURETIC PEPTIDE: B Natriuretic Peptide: 10.1 pg/mL (ref 0.0–100.0)

## 2014-10-10 MED ORDER — RANITIDINE HCL 150 MG/10ML PO SYRP
300.0000 mg | ORAL_SOLUTION | Freq: Once | ORAL | Status: AC
  Administered 2014-10-10: 300 mg via ORAL
  Filled 2014-10-10 (×2): qty 20

## 2014-10-10 MED ORDER — ALUM & MAG HYDROXIDE-SIMETH 200-200-20 MG/5ML PO SUSP
30.0000 mL | Freq: Once | ORAL | Status: AC
  Administered 2014-10-10: 30 mL via ORAL
  Filled 2014-10-10: qty 30

## 2014-10-10 MED ORDER — RANITIDINE HCL 150 MG/10ML PO SYRP: 300.0000 mg | ORAL_SOLUTION | Freq: Every day | ORAL | Status: DC

## 2014-10-10 NOTE — ED Notes (Signed)
Pt. reports left chest pain with SOB and nausea onset this evening , denies diaphoresis or cough .
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
## 2014-10-10 NOTE — Discharge Instructions (Signed)
Chest Pain (Nonspecific) Preston [name], you were seen today for chest pain.  Your EKG and heart markers do not show any signs of heart attack.  Take ranitidine, stop taking nexium for possible gastritis causing your pain.  If symptoms worsen, come back to the ED immediately. Thank you. It is often hard to give a diagnosis for the cause of chest pain. There is always a chance that your pain could be related to something serious, such as a heart attack or a blood clot in the lungs. You need to follow up with your doctor. HOME CARE  If antibiotic medicine was given, take it as directed by your doctor. Finish the medicine even if you start to feel better.  For the next few days, avoid activities that bring on chest pain. Continue physical activities as told by your doctor.  Do not use any tobacco products. This includes cigarettes, chewing tobacco, and e-cigarettes.  Avoid drinking alcohol.  Only take medicine as told by your doctor.  Follow your doctor's suggestions for more testing if your chest pain does not go away.  Keep all doctor visits you made. GET HELP IF:  Your chest pain does not go away, even after treatment.  You have a rash with blisters on your chest.  You have a fever. GET HELP RIGHT AWAY IF:   You have more pain or pain that spreads to your arm, neck, jaw, back, or belly (abdomen).  You have shortness of breath.  You cough more than usual or cough up blood.  You have very bad back or belly pain.  You feel sick to your stomach (nauseous) or throw up (vomit).  You have very bad weakness.  You pass out (faint).  You have chills. This is an emergency. Do not wait to see if the problems will go away. Call your local emergency services (911 in [place] Do not drive yourself to the hospital. MAKE SURE YOU:   Understand these instructions.  Will watch your condition.  Will get help right away if you are not doing well or get worse. Document Released: 09/21/2007  Document Revised: 04/09/2013 Document Reviewed: 09/21/2007 [facility] Patient Information 2015 [place], [place]. This information is not intended to replace advice given to you by your health care provider. Make sure you discuss any questions you have with your health care provider.

## 2014-10-12 ENCOUNTER — Encounter (HOSPITAL_COMMUNITY): Payer: Self-pay | Admitting: Emergency Medicine

## 2014-10-12 ENCOUNTER — Emergency Department (HOSPITAL_COMMUNITY)
Admission: EM | Admit: 2014-10-12 | Discharge: 2014-10-13 | Disposition: A | Discharge status: Home / Self Care | Payer: Self-pay | Attending: Emergency Medicine | Admitting: Emergency Medicine

## 2014-10-12 ENCOUNTER — Emergency Department (HOSPITAL_COMMUNITY)
Admission: EM | Admit: 2014-10-12 | Discharge: 2014-10-12 | Disposition: A | Discharge status: Home / Self Care | Payer: Self-pay | Attending: Emergency Medicine | Admitting: Emergency Medicine

## 2014-10-12 DIAGNOSIS — Z7982 Long term (current) use of aspirin: Secondary | ICD-10-CM | POA: Insufficient documentation

## 2014-10-12 DIAGNOSIS — R079 Chest pain, unspecified: Secondary | ICD-10-CM | POA: Insufficient documentation

## 2014-10-12 DIAGNOSIS — Z791 Long term (current) use of non-steroidal anti-inflammatories (NSAID): Secondary | ICD-10-CM | POA: Insufficient documentation

## 2014-10-12 DIAGNOSIS — F419 Anxiety disorder, unspecified: Secondary | ICD-10-CM | POA: Insufficient documentation

## 2014-10-12 DIAGNOSIS — R0602 Shortness of breath: Secondary | ICD-10-CM | POA: Insufficient documentation

## 2014-10-12 DIAGNOSIS — Z87891 Personal history of nicotine dependence: Secondary | ICD-10-CM | POA: Insufficient documentation

## 2014-10-12 DIAGNOSIS — G8929 Other chronic pain: Secondary | ICD-10-CM | POA: Insufficient documentation

## 2014-10-12 DIAGNOSIS — R1013 Epigastric pain: Secondary | ICD-10-CM | POA: Insufficient documentation

## 2014-10-12 DIAGNOSIS — H532 Diplopia: Secondary | ICD-10-CM | POA: Insufficient documentation

## 2014-10-12 DIAGNOSIS — R110 Nausea: Secondary | ICD-10-CM | POA: Insufficient documentation

## 2014-10-12 DIAGNOSIS — Z79899 Other long term (current) drug therapy: Secondary | ICD-10-CM | POA: Insufficient documentation

## 2014-10-12 LAB — I-STAT TROPONIN, ED: Troponin i, poc: 0.01 ng/mL (ref 0.00–0.08)

## 2014-10-12 MED ORDER — HYDROXYZINE HCL 25 MG PO TABS: 25.0000 mg | ORAL_TABLET | Freq: Four times a day (QID) | ORAL | Status: DC | PRN

## 2014-10-12 NOTE — ED Notes (Signed)
C/o belching, epigastric pain, double vision, dizziness, sob, and nausea x 20 min.  Pt states he has been seen here the last 2 nights for same symptoms and is suppose to f/u with PCP on Thursday.

## 2014-10-12 NOTE — Discharge Instructions (Signed)

## 2014-10-12 NOTE — ED Provider Notes (Signed)
CSN: [id number]     Arrival date & time 10/12/14  0714 History   First MD Initiated Contact with Patient 10/12/14 [phone]     Chief Complaint  Patient presents with  . Chest Pain     (Consider location/radiation/quality/duration/timing/severity/associated sxs/prior Treatment) Patient is a 34 y.o. male presenting with chest pain. The history is provided by the patient.  Chest Pain Associated symptoms: palpitations   Associated symptoms: no abdominal pain, no back pain, no headache, no nausea, no numbness, no shortness of breath, not vomiting and no weakness    patient presents with chest pain and dizziness. Been having episodes over the last month. He's been seen in the ER 11 times in the last month. Has had similar episodes tend to come on with anxiety. He will have episodes of chest pain and episodes of dizziness without having the other. We will sometimes come together. States he feels as if his body has a little wavy as if he has been drinking. Occasional shortness of breath. No cough. No swelling his legs. States he has lost a little weight since this started.  Past Medical History  Diagnosis Date  . Depression   . Anxiety    Past Surgical History  Procedure Laterality Date  . No past surgeries     Family History  Problem Relation Age of Onset  . Cancer Father   . Diabetes Father   . Heart attack Other    History  Substance Use Topics  . Smoking status: Former Smoker -- 0.00 packs/day    Types: Cigars    Quit date: 09/06/2014  . Smokeless tobacco: Not on file  . Alcohol Use: No     Comment: social    Review of Systems  Constitutional: Negative for activity change and appetite change.  Eyes: Negative for pain.  Respiratory: Negative for chest tightness and shortness of breath.   Cardiovascular: Positive for chest pain and palpitations. Negative for leg swelling.  Gastrointestinal: Negative for nausea, vomiting, abdominal pain and diarrhea.  Genitourinary: Negative for  flank pain.  Musculoskeletal: Negative for back pain and neck stiffness.  Skin: Negative for rash.  Neurological: Negative for weakness, numbness and headaches.  Psychiatric/Behavioral: Negative for behavioral problems. The patient is nervous/anxious.       Allergies  Red dye and Yellow dyes (non-tartrazine)  Home Medications   Prior to Admission medications   Medication Sig Start Date End Date Taking? Authorizing Provider  aspirin EC 81 MG tablet Take 81 mg by mouth every morning. 09/20/14 09/20/15 Yes Historical Provider, MD  loratadine (CLARITIN) 10 MG tablet Take 10 mg by mouth daily as needed for allergies.   Yes Historical Provider, MD  meclizine (ANTIVERT) 12.5 MG tablet Take 1 tablet (12.5 mg total) by mouth 3 (three) times daily as needed for dizziness. 09/18/14  Yes Lyndal Pulley, MD  naproxen sodium (ANAPROX) 220 MG tablet Take 220 mg by mouth 2 (two) times daily with a meal.   Yes Historical Provider, MD  ranitidine (ZANTAC) 150 MG/10ML syrup Take 20 mLs (300 mg total) by mouth daily. 10/10/14  Yes Tomasita Crumble, MD  hydrOXYzine (ATARAX/VISTARIL) 25 MG tablet Take 1 tablet (25 mg total) by mouth every 6 (six) hours as needed. 10/12/14   Benjiman Core, MD   BP 125/91 mmHg  Pulse 56  Temp(Src) 97.7 F (36.5 C) (Oral)  Resp 18  Ht 6' (1.829 m)  Wt 320 lb (145.151 kg)  BMI 43.39 kg/m2  SpO2 95% Physical Exam  Constitutional: He  is oriented to person, place, and time. He appears well-developed and well-nourished.  HENT:  Head: Normocephalic and atraumatic.  Eyes: EOM are normal. Pupils are equal, round, and reactive to light.  Neck: Normal range of motion. Neck supple.  Cardiovascular: Normal rate, regular rhythm and normal heart sounds.   No murmur heard. Pulmonary/Chest: Effort normal and breath sounds normal.  Abdominal: Soft. Bowel sounds are normal. He exhibits no distension. There is no tenderness.  Musculoskeletal: Normal range of motion. He exhibits no edema.   Neurological: He is alert and oriented to person, place, and time.  Skin: Skin is warm and dry.  Psychiatric: He has a normal mood and affect.  Nursing note and vitals reviewed.   ED Course  Procedures (including critical care time) Labs Review Labs Reviewed  Rosezena Sensor, ED    Imaging Review No results found.   EKG Interpretation   Date/Time:  Sunday October 12 2014 07:21:32 EDT Ventricular Rate:  79 PR Interval:  184 QRS Duration: 92 QT Interval:  400 QTC Calculation: 458 R Axis:   64 Text Interpretation:  Normal sinus rhythm Nonspecific ST abnormality  Abnormal ECG Confirmed by Rubin Payor  MD, Harrold Donath [phone]) on 10/12/2014  7:47:19 AM      MDM   Final diagnoses:  Anxiety    Patient with episodes of chest pain. Likely due to anxiety. Has had extensive workup. Discussed with psychiatry who recommended that the patient actually fill his Atarax and not start any long-acting medications at this time. Will need follow-up and patient was given resources.    Benjiman Core, MD 10/12/14 [phone]

## 2014-10-12 NOTE — ED Notes (Signed)
Pt. Stated, I started having chest pain with dizziness about 30 min ago.  This all started about a month ago. I've been here about 3 times for the same problem and they say is indigestion.

## 2014-10-13 MED ORDER — HYDROXYZINE HCL 25 MG PO TABS
25.0000 mg | ORAL_TABLET | Freq: Once | ORAL | Status: AC
  Administered 2014-10-13: 25 mg via ORAL
  Filled 2014-10-13: qty 1

## 2014-10-13 MED ORDER — GI COCKTAIL ~~LOC~~: 30.0000 mL | Freq: Once | ORAL | Status: DC

## 2014-10-13 MED ORDER — RANITIDINE HCL 150 MG/10ML PO SYRP: 300.0000 mg | ORAL_SOLUTION | Freq: Every day | ORAL | Status: DC

## 2014-10-13 MED ORDER — GI COCKTAIL ~~LOC~~
30.0000 mL | Freq: Once | ORAL | Status: AC
  Administered 2014-10-13: 30 mL via ORAL
  Filled 2014-10-13: qty 30

## 2014-10-13 MED ORDER — HYDROXYZINE HCL 25 MG PO TABS: 25.0000 mg | ORAL_TABLET | Freq: Once | ORAL | Status: DC

## 2014-10-13 NOTE — ED Provider Notes (Signed)
CSN: [id number]     Arrival date & time 10/12/14  2301 History   First MD Initiated Contact with Patient 10/12/14 2358     Chief Complaint  Patient presents with  . Chest Pain  . Dizziness     (Consider location/radiation/quality/duration/timing/severity/associated sxs/prior Treatment) HPI   Mr. Preston [name] is a 34 year old male who presents with multiple complaints, including epigastric burning and some dizziness where he felt like he was going to pass out. He describes his abdominal pain as indigestion. He says it came out of nowhere, and occurred at about 3 PM today while he was sleeping. He ate half of a [employer] sandwich and then took a shower and went to sleep, before the epigastric pain woke him up and then he had some transient double vision and dizziness.  He has been seen 13 times in the last month, including evaluation in the ER earlier today.  He has medical history of conversion disorder, anxiety, paresthesias, and atypical chest pain.  He has other complaints of belching, double vision, shortness of breath and nausea that lasted for 20 minutes. Poorly has follow-up with the primary care physician on Thursday.  This morning he was given a prescription for Vistaril for anxiety, although he has not yet taken this.  He is currently denying chest pain, shortness of breath, lightheadedness, palpitations, dizziness. Pt has an upcoming appointment with new PCP on 6/30.   Past Medical History  Diagnosis Date  . Depression   . Anxiety    Past Surgical History  Procedure Laterality Date  . No past surgeries     Family History  Problem Relation Age of Onset  . Cancer Father   . Diabetes Father   . Heart attack Other    History  Substance Use Topics  . Smoking status: Former Smoker -- 0.00 packs/day    Types: Cigars    Quit date: 09/06/2014  . Smokeless tobacco: Not on file  . Alcohol Use: No     Comment: social    Review of Systems  Constitutional: Negative for fever, chills,  diaphoresis and fatigue.  HENT: Negative for sore throat and trouble swallowing.   Respiratory: Negative for cough, chest tightness, shortness of breath, wheezing and stridor.   Cardiovascular: Negative for chest pain, palpitations and leg swelling.  Gastrointestinal: Positive for blood in stool. Negative for nausea, vomiting, diarrhea, constipation and abdominal distention.  Genitourinary: Negative.   Musculoskeletal: Negative.   Skin: Negative.   Neurological: Negative for tremors, seizures, syncope, facial asymmetry, weakness, numbness and headaches.  Psychiatric/Behavioral: Negative for agitation. The patient is nervous/anxious.    10 Systems reviewed and are negative for acute change except as noted in the HPI.   Allergies  Red dye and Yellow dyes (non-tartrazine)  Home Medications   Prior to Admission medications   Medication Sig Start Date End Date Taking? Authorizing Provider  aspirin EC 81 MG tablet Take 81 mg by mouth every morning. 09/20/14 09/20/15 Yes Historical Provider, MD  loratadine (CLARITIN) 10 MG tablet Take 10 mg by mouth daily as needed for allergies.   Yes Historical Provider, MD  meclizine (ANTIVERT) 12.5 MG tablet Take 1 tablet (12.5 mg total) by mouth 3 (three) times daily as needed for dizziness. 09/18/14  Yes Lyndal Pulley, MD  naproxen sodium (ANAPROX) 220 MG tablet Take 220 mg by mouth 2 (two) times daily with a meal.   Yes Historical Provider, MD  hydrOXYzine (ATARAX/VISTARIL) 25 MG tablet Take 1 tablet (25 mg total) by mouth every  6 (six) hours as needed. 10/12/14   Benjiman CoreNathan Pickering, MD  ranitidine (ZANTAC) 150 MG/10ML syrup Take 20 mLs (300 mg total) by mouth daily. 10/13/14   Danelle BerryLeisa Elai Vanwyk, PA-C   BP 110/75 mmHg  Pulse 76  Temp(Src) 98.2 F (36.8 C) (Oral)  Resp 23  Ht 6\' 5"  (1.956 m)  Wt 312 lb (141.522 kg)  BMI 36.99 kg/m2  SpO2 100% Physical Exam  Constitutional: He is oriented to person, place, and time. Vital signs are normal. He appears well-developed  and well-nourished. He does not appear ill. No distress.  HENT:  Head: Normocephalic and atraumatic.  Right Ear: External ear normal.  Left Ear: External ear normal.  Nose: Nose normal.  Mouth/Throat: Uvula is midline, oropharynx is clear and moist and mucous membranes are normal. Mucous membranes are not pale, not dry and not cyanotic. No oropharyngeal exudate, posterior oropharyngeal edema or posterior oropharyngeal erythema.  Eyes: Conjunctivae and EOM are normal. Pupils are equal, round, and reactive to light. Right eye exhibits no discharge. Left eye exhibits no discharge. No scleral icterus.  Neck: Normal range of motion. Neck supple. No JVD present. No tracheal deviation present. No thyromegaly present.  Cardiovascular: Normal rate, regular rhythm, normal heart sounds and intact distal pulses.  PMI is not displaced.  Exam reveals no gallop and no friction rub.   No murmur heard. Pulmonary/Chest: Effort normal and breath sounds normal. No accessory muscle usage or stridor. No tachypnea. No respiratory distress. He has no decreased breath sounds. He has no wheezes. He has no rhonchi. He has no rales. He exhibits no tenderness.  Abdominal: Soft. Normal appearance and bowel sounds are normal. He exhibits no distension and no mass. There is tenderness in the epigastric area. There is no rigidity, no rebound, no guarding, no CVA tenderness, no tenderness at McBurney's point and negative Murphy's sign.  Musculoskeletal: Normal range of motion. He exhibits no edema or tenderness.  Lymphadenopathy:    He has no cervical adenopathy.  Neurological: He is alert and oriented to person, place, and time. He has normal reflexes. No cranial nerve deficit. He exhibits normal muscle tone. Coordination normal.  Skin: Skin is warm and dry. No rash noted. No erythema. No pallor.  Psychiatric: He has a normal mood and affect. His behavior is normal. Judgment and thought content normal.  Nursing note and vitals  reviewed.   ED Course  Procedures (including critical care time) Labs Review Labs Reviewed - No data to display  Oxygen saturation is 100% on room air, normal by my interpretation  Coordination of care:  Will treat with GI cocktail and Vistaril to calm his nerves, discussed treatment plan with patient with family members at bedside and patient agrees to plan.  Imaging Review No results found.   EKG Interpretation None      MDM   Final diagnoses:  Chronic epigastric pain  Anxiety   Patient presents with multiple complaints, to most chief complaints are epigastric pain and dizziness.  Patient had EKG done in triage, and earlier today had a negative troponin.  He did not describe any chest pain to me, so I will not do any cardiac workup at this time.  All complaints seem to be epigastric, and acid reflux. He has been in the ER approximately 13 times in the last month, always presyncope, chest pain, dyspepsia, dizziness.  Upon my exam, he is complaining of epigastric burning, will treat with a GI cocktail and hydroxyzine.  Will reevaluate for response.  Vitals  are stable, he is afebrile, not tachycardic.  EKG is normal sinus, with no ST elevation.    I have encouraged the patient to seek primary care provider for outpatient follow-up. He is scheduled June 30 with a new PCP. Patient was educated about gastroesophageal reflux disease, foods that exacerbated, and ways to avoid it.  Handout was given as well as a prescription for liquid ranitidine, which he claims was helpful before for his epigastric pain.  Patient vitals are stable, he has had some relief with the GI cocktail and Vistaril.  Medications  gi cocktail (Maalox,Lidocaine,Donnatal) (30 mLs Oral Given 10/13/14 0146)  hydrOXYzine (ATARAX/VISTARIL) tablet 25 mg (25 mg Oral Given 10/13/14 0119)   Filed Vitals:   10/13/14 0100 10/13/14 0115 10/13/14 0130 10/13/14 0223  BP: 119/78 120/73 126/89 110/75  Pulse: 69 108 61 76   Temp:      TempSrc:      Resp: 17 23 13 23   Height:      Weight:      SpO2: 100% 99% 100% 100%   Patient was stable for discharge.  Danelle Berry, PA-C 10/13/14 0236  Arby Barrette, MD 10/13/14 [phone]

## 2014-10-13 NOTE — Discharge Instructions (Signed)
Acid Reflux  HOME CARE INSTRUCTIONS  Avoid foods and drinks that seem to make your symptoms worse.  Eat small, frequent meals instead of large meals.  Avoid eating for the 3 hours prior to your bedtime.  If you have trouble taking pills, use a pill splitter to decrease the size and likelihood of the pill getting stuck or injuring the esophagus on the way down. Drinking water after taking a pill also helps.  Stop smoking if you smoke.  Maintain a healthy weight.  Wear loose-fitting clothing. Do not wear anything tight around your waist that causes pressure on your stomach.  Raise the head of your bed 6 to 8 inches with wood blocks to help you sleep. Extra pillows will not help.  Only take over-the-counter or prescription medicines as directed by your caregiver.  AVOID:   Fatty foods.  Spicy foods.  Acidic foods, like tomatoes and citrus.  Mint.  Chocolate.  Onions.  Coffee or any caffeinated beverage.  Carbonated beverages.

## 2014-10-16 ENCOUNTER — Ambulatory Visit: Payer: Self-pay | Attending: Internal Medicine | Admitting: Internal Medicine

## 2014-10-16 ENCOUNTER — Other Ambulatory Visit: Payer: Self-pay

## 2014-10-16 ENCOUNTER — Encounter: Payer: Self-pay | Admitting: Internal Medicine

## 2014-10-16 VITALS — BP 138/88 | HR 99 | Temp 97.9°F | Resp 18 | Wt 310.0 lb

## 2014-10-16 DIAGNOSIS — R079 Chest pain, unspecified: Secondary | ICD-10-CM

## 2014-10-16 DIAGNOSIS — F419 Anxiety disorder, unspecified: Secondary | ICD-10-CM

## 2014-10-16 DIAGNOSIS — F1290 Cannabis use, unspecified, uncomplicated: Secondary | ICD-10-CM

## 2014-10-16 DIAGNOSIS — K219 Gastro-esophageal reflux disease without esophagitis: Secondary | ICD-10-CM

## 2014-10-16 DIAGNOSIS — F1210 Cannabis abuse, uncomplicated: Secondary | ICD-10-CM

## 2014-10-16 MED ORDER — OMEPRAZOLE 40 MG PO CPDR: 40.0000 mg | DELAYED_RELEASE_CAPSULE | Freq: Every day | ORAL | Status: DC

## 2014-10-16 MED ORDER — PANTOPRAZOLE SODIUM 40 MG PO TBEC: 40.0000 mg | DELAYED_RELEASE_TABLET | Freq: Every day | ORAL | Status: DC

## 2014-10-16 MED ORDER — LORAZEPAM 0.5 MG PO TABS: 0.5000 mg | ORAL_TABLET | Freq: Two times a day (BID) | ORAL | Status: DC | PRN

## 2014-10-16 NOTE — Progress Notes (Signed)
Patient ID: Preston [name], male   DOB: December 08, 1980, 34 y.o.   MRN: [id number]  NWG:[id number]  [id number]:[id number]  DOB - 1980-11-17  CC:  Chief Complaint  Patient presents with  . New patient    Dizziness       HPI: Preston [name] is a 34 y.o. male here today to establish medical care with a PCP. Patient has a past medical history of anxiety and depression. Patient reports that for that for the past couple of months he has been having dizziness everyday. Review of the chart reveals that he has had 12 ED visits in the past 6 months, with 4 of those visits in the past one week. He has been having these symptoms since May 30th. He reports that on 5/30 he was smoking marijuana and noticed the symptoms during smoking so he stopped. He reports that he smokes marijuana at least 4 days per week, but not since that day. His symptoms include chest pain, SOB, dizziness, diaphoresis, headaches, blurred vision, heartburns. The chest pain begins in his epigastric region and feels like a knot that often moves to his left side under his arm. The pain is sometimes described as a pins and needles felling all over his body. He has since been seen by a cardiologist at [facility] who diagnosed him with Patent Foramen Ovale and gave him a referral to Gastroenterology for acid reflux.  Patient has No headache, No chest pain, No abdominal pain - No Nausea, No new weakness tingling or numbness, No Cough - SOB.  Allergies  Allergen Reactions  . Red Dye Itching  . Yellow Dyes (Non-Tartrazine) Itching   Past Medical History  Diagnosis Date  . Depression   . Anxiety    Current Outpatient Prescriptions on File Prior to Visit  Medication Sig Dispense Refill  . aspirin EC 81 MG tablet Take 81 mg by mouth every morning.    . hydrOXYzine (ATARAX/VISTARIL) 25 MG tablet Take 1 tablet (25 mg total) by mouth every 6 (six) hours as needed. 12 tablet 0  . loratadine (CLARITIN) 10 MG tablet Take 10 mg by mouth daily as  needed for allergies.    Marland Kitchen meclizine (ANTIVERT) 12.5 MG tablet Take 1 tablet (12.5 mg total) by mouth 3 (three) times daily as needed for dizziness. 30 tablet 0  . naproxen sodium (ANAPROX) 220 MG tablet Take 220 mg by mouth 2 (two) times daily with a meal.    . ranitidine (ZANTAC) 150 MG/10ML syrup Take 20 mLs (300 mg total) by mouth daily. 300 mL 0  . [DISCONTINUED] esomeprazole (NEXIUM) 40 MG capsule Take 1 capsule (40 mg total) by mouth daily. 30 capsule 0   No current facility-administered medications on file prior to visit.   Family History  Problem Relation Age of Onset  . Cancer Father   . Diabetes Father   . Heart attack Other    History   Social History  . Marital Status: Single    Spouse Name: N/A  . Number of Children: N/A  . Years of Education: N/A   Occupational History  . Not on file.   Social History Main Topics  . Smoking status: Former Smoker -- 0.00 packs/day    Types: Cigars    Quit date: 09/06/2014  . Smokeless tobacco: Not on file  . Alcohol Use: No     Comment: social  . Drug Use: 3.50 per week    Special: Marijuana     Comment: last use one month  ago (10/07/14)  . Sexual Activity: Not on file   Other Topics Concern  . Not on file   Social History Narrative    Review of Systems: See HPI    Objective:   Filed Vitals:   10/16/14 1146  BP: 138/88  Pulse: 99  Temp: 97.9 F (36.6 C)  Resp: 18    Physical Exam  Constitutional: He is oriented to person, place, and time.  Cardiovascular: Normal rate, regular rhythm and normal heart sounds.   No murmur heard. Pulmonary/Chest: Effort normal and breath sounds normal.  Musculoskeletal: He exhibits tenderness (left chest with palpitations ). He exhibits no edema.  Neurological: He is alert and oriented to person, place, and time.  Skin: Skin is warm and dry.     [facility] 10/07/2014  Decreased Interest 2  Down, Depressed, Hopeless 2  PHQ - 2 Score 4  Altered sleeping 1    Tired, decreased energy 0  Change in appetite 2  Feeling bad or failure about yourself  2  Trouble concentrating 0  Moving slowly or fidgety/restless 1  Suicidal thoughts 2  PHQ-9 Score 12      Lab Results  Component Value Date   WBC 9.0 10/10/2014   HGB 13.9 10/10/2014   HCT 40.2 10/10/2014   MCV 84.5 10/10/2014   PLT 285 10/10/2014   Lab Results  Component Value Date   CREATININE 1.10 10/10/2014   BUN 9 10/10/2014   NA 136 10/10/2014   K 4.0 10/10/2014   CL 104 10/10/2014   CO2 24 10/10/2014    No results found for: HGBA1C Lipid Panel  No results found for: CHOL, TRIG, HDL, CHOLHDL, VLDL, LDLCALC     Assessment and plan:   Apolinar JunesBrandon was seen today for new patient.  Diagnoses and all orders for this visit:  Anxiety Orders: -     LORazepam (ATIVAN) 0.5 MG tablet; Take 1 tablet (0.5 mg total) by mouth 2 (two) times daily as needed for anxiety. I will have patient to take twice daily for 2 weeks and RTC for further evaluation. Advised cessation or marijuana now. I have asked patient to RTC instead of ER for symptoms of anxiety if it is during normal business hours.   Chest pain, unspecified chest pain type Related to GERD and anxiety in my opinion. I feel confident releasing patient today after reviewing Cardiology notes and reviewing past EKG and ECHO. See above Last Cardiology note states that patient may have some Costochondritis, he has minimal tenderness to palpitation of left chest. He is currently taking tylenol for pain. Stopped Naproxen due to GI upset.   Gastroesophageal reflux disease, esophagitis presence not specified Orders: -     omeprazole (PRILOSEC) 40 MG capsule; Take 1 capsule (40 mg total) by mouth daily. I do believe he also is having some chest pain related to acid reflux. [facility]. He will begin on PPI daily to see if he has any improvement in symptoms. Diet changes discussed in detail.   Marijuana  use I have explained how marijuana may exacerbate symptoms of anxiety. I have went over the possibility of getting some laced marijuana as well. I have asked patient to quit smoking altogether.    Return in about 2 weeks (around 10/30/2014) for anxiety.  The patient was given clear instructions to go to ER or return to medical center if symptoms don't improve, worsen or new problems develop. The patient verbalized understanding.  The patient was told to call to get lab results if they haven't heard anything in the next week.     [name], NP-C [facility] [phone] 10/16/2014, 11:58 AM

## 2014-10-16 NOTE — Patient Instructions (Signed)
Generalized Anxiety Disorder Generalized anxiety disorder (GAD) is a mental disorder. It interferes with life functions, including relationships, work, and school. GAD is different from normal anxiety, which everyone experiences at some point in their lives in response to specific life events and activities. Normal anxiety actually helps us prepare for and get through these life events and activities. Normal anxiety goes away after the event or activity is over.  GAD causes anxiety that is not necessarily related to specific events or activities. It also causes excess anxiety in proportion to specific events or activities. The anxiety associated with GAD is also difficult to control. GAD can vary from mild to severe. People with severe GAD can have intense waves of anxiety with physical symptoms (panic attacks).  SYMPTOMS The anxiety and worry associated with GAD are difficult to control. This anxiety and worry are related to many life events and activities and also occur more days than not for 6 months or longer. People with GAD also have three or more of the following symptoms (one or more in children):  Restlessness.   Fatigue.  Difficulty concentrating.   Irritability.  Muscle tension.  Difficulty sleeping or unsatisfying sleep. DIAGNOSIS GAD is diagnosed through an assessment by your health care provider. Your health care provider will ask you questions aboutyour mood,physical symptoms, and events in your life. Your health care provider may ask you about your medical history and use of alcohol or drugs, including prescription medicines. Your health care provider may also do a physical exam and blood tests. Certain medical conditions and the use of certain substances can cause symptoms similar to those associated with GAD. Your health care provider may refer you to a mental health specialist for further evaluation. TREATMENT The following therapies are usually used to treat GAD:    Medication. Antidepressant medication usually is prescribed for long-term daily control. Antianxiety medicines may be added in severe cases, especially when panic attacks occur.   Talk therapy (psychotherapy). Certain types of talk therapy can be helpful in treating GAD by providing support, education, and guidance. A form of talk therapy called cognitive behavioral therapy can teach you healthy ways to think about and react to daily life events and activities.  Stress managementtechniques. These include yoga, meditation, and exercise and can be very helpful when they are practiced regularly. A mental health specialist can help determine which treatment is best for you. Some people see improvement with one therapy. However, other people require a combination of therapies. Document Released: 07/30/2012 Document Revised: 08/19/2013 Document Reviewed: 07/30/2012 ExitCare Patient Information 2015 [employer]. This information is not intended to replace advice given to you by your health care provider. Make sure you discuss any questions you have with your health care provider. Food Choices for Gastroesophageal Reflux Disease When you have gastroesophageal reflux disease (GERD), the foods you eat and your eating habits are very important. Choosing the right foods can help ease the discomfort of GERD. WHAT GENERAL GUIDELINES DO I NEED TO FOLLOW?  Choose fruits, vegetables, whole grains, low-fat dairy products, and low-fat meat, fish, and poultry.  Limit fats such as oils, salad dressings, butter, nuts, and avocado.  Keep a food diary to identify foods that cause symptoms.  Avoid foods that cause reflux. These may be different for different people.  Eat frequent small meals instead of three large meals each day.  Eat your meals slowly, in a relaxed setting.  Limit fried foods.  Cook foods using methods other than frying.  Avoid drinking   alcohol.  Avoid drinking large amounts of  liquids with your meals.  Avoid bending over or lying down until 2-3 hours after eating. WHAT FOODS ARE NOT RECOMMENDED? The following are some foods and drinks that may worsen your symptoms: Vegetables Tomatoes. Tomato juice. Tomato and spaghetti sauce. Chili peppers. Onion and garlic. Horseradish. Fruits Oranges, grapefruit, and lemon (fruit and juice). Meats High-fat meats, fish, and poultry. This includes hot dogs, ribs, ham, sausage, salami, and bacon. Dairy Whole milk and chocolate milk. Sour cream. Cream. Butter. Ice cream. Cream cheese.  Beverages Coffee and tea, with or without caffeine. Carbonated beverages or energy drinks. Condiments Hot sauce. Barbecue sauce.  Sweets/Desserts Chocolate and cocoa. Donuts. Peppermint and spearmint. Fats and Oils High-fat foods, including [place] fries and potato chips. Other Vinegar. Strong spices, such as black pepper, white pepper, red pepper, cayenne, curry powder, cloves, ginger, and chili powder. The items listed above may not be a complete list of foods and beverages to avoid. Contact your dietitian for more information. Document Released: 04/04/2005 Document Revised: 04/09/2013 Document Reviewed: 02/06/2013 [facility] Patient Information 2015 [employer] [place]. This information is not intended to replace advice given to you by your health care provider. Make sure you discuss any questions you have with your health care provider.

## 2014-10-16 NOTE — Progress Notes (Signed)
  New patient here to established care. Pt states he has been feeling dizzy everyday for a couple of months. This morning he states it was hard from him to walk.

## 2014-10-27 ENCOUNTER — Encounter (HOSPITAL_COMMUNITY): Payer: Self-pay | Admitting: Nurse Practitioner

## 2014-10-27 ENCOUNTER — Emergency Department (HOSPITAL_COMMUNITY)
Admission: EM | Admit: 2014-10-27 | Discharge: 2014-10-27 | Disposition: A | Discharge status: Home / Self Care | Payer: Self-pay | Attending: Emergency Medicine | Admitting: Emergency Medicine

## 2014-10-27 ENCOUNTER — Emergency Department (HOSPITAL_COMMUNITY): Payer: Self-pay

## 2014-10-27 DIAGNOSIS — K219 Gastro-esophageal reflux disease without esophagitis: Secondary | ICD-10-CM | POA: Insufficient documentation

## 2014-10-27 DIAGNOSIS — F419 Anxiety disorder, unspecified: Secondary | ICD-10-CM | POA: Insufficient documentation

## 2014-10-27 DIAGNOSIS — Z7982 Long term (current) use of aspirin: Secondary | ICD-10-CM | POA: Insufficient documentation

## 2014-10-27 DIAGNOSIS — Z791 Long term (current) use of non-steroidal anti-inflammatories (NSAID): Secondary | ICD-10-CM | POA: Insufficient documentation

## 2014-10-27 DIAGNOSIS — R079 Chest pain, unspecified: Secondary | ICD-10-CM | POA: Insufficient documentation

## 2014-10-27 DIAGNOSIS — Z87891 Personal history of nicotine dependence: Secondary | ICD-10-CM | POA: Insufficient documentation

## 2014-10-27 DIAGNOSIS — Z79899 Other long term (current) drug therapy: Secondary | ICD-10-CM | POA: Insufficient documentation

## 2014-10-27 HISTORY — DX: Gastro-esophageal reflux disease without esophagitis: K21.9

## 2014-10-27 HISTORY — DX: Gastroparesis: K31.84

## 2014-10-27 LAB — BASIC METABOLIC PANEL
Anion gap: 8 (ref 5–15)
BUN: 8 mg/dL (ref 6–20)
CALCIUM: 9.5 mg/dL (ref 8.9–10.3)
CO2: 24 mmol/L (ref 22–32)
Chloride: 106 mmol/L (ref 101–111)
Creatinine, Ser: 1.1 mg/dL (ref 0.61–1.24)
Glucose, Bld: 105 mg/dL — ABNORMAL HIGH (ref 65–99)
POTASSIUM: 3.5 mmol/L (ref 3.5–5.1)
SODIUM: 138 mmol/L (ref 135–145)

## 2014-10-27 LAB — CBC
HCT: 41.4 % (ref 39.0–52.0)
HEMOGLOBIN: 14.3 g/dL (ref 13.0–17.0)
MCH: 28.9 pg (ref 26.0–34.0)
MCHC: 34.5 g/dL (ref 30.0–36.0)
MCV: 83.8 fL (ref 78.0–100.0)
Platelets: 319 10*3/uL (ref 150–400)
RBC: 4.94 MIL/uL (ref 4.22–5.81)
RDW: 13.5 % (ref 11.5–15.5)
WBC: 6.1 10*3/uL (ref 4.0–10.5)

## 2014-10-27 LAB — I-STAT TROPONIN, ED: Troponin i, poc: 0 ng/mL (ref 0.00–0.08)

## 2014-10-27 NOTE — Discharge Instructions (Signed)

## 2014-10-27 NOTE — ED Provider Notes (Signed)
CSN: [id number]     Arrival date & time 10/27/14  1136 History   First MD Initiated Contact with Patient 10/27/14 1426     Chief Complaint  Patient presents with  . Chest Pain     (Consider location/radiation/quality/duration/timing/severity/associated sxs/prior Treatment) HPI Comments: Patient presents with chest pain. He has a history of gastroesophageal reflux disease. He takes omeprazole. He states he feels like he's having some worsening reflux symptoms. He states he's had intermittent discomfort to the center of his chest for the last 2 days. He states it's a little bit worse after eating. It did get a little bit better after Tylenol. He's had no nausea or vomiting. No abdominal pain. No fevers. No shortness of breath. No exertional symptoms. No leg pain or swelling. He denies any cough or chest congestion. He states he's been taking his omeprazole without missing any doses. [facility] the recent past who did not find any significant concerns.  Patient is a 34 y.o. male presenting with chest pain.  Chest Pain Associated symptoms: no abdominal pain, no back pain, no cough, no diaphoresis, no dizziness, no fatigue, no fever, no headache, no nausea, no numbness, no shortness of breath, not vomiting and no weakness     Past Medical History  Diagnosis Date  . Depression   . Anxiety   . Gastroparesis   . Acid reflux    Past Surgical History  Procedure Laterality Date  . No past surgeries     Family History  Problem Relation Age of Onset  . Cancer Father   . Diabetes Father   . Heart attack Other    History  Substance Use Topics  . Smoking status: Former Smoker -- 0.00 packs/day    Types: Cigars    Quit date: 09/06/2014  . Smokeless tobacco: Not on file  . Alcohol Use: No     Comment: social    Review of Systems  Constitutional: Negative for fever, chills, diaphoresis and fatigue.   HENT: Negative for congestion, rhinorrhea and sneezing.   Eyes: Negative.   Respiratory: Negative for cough, chest tightness and shortness of breath.   Cardiovascular: Positive for chest pain. Negative for leg swelling.  Gastrointestinal: Negative for nausea, vomiting, abdominal pain, diarrhea and blood in stool.  Genitourinary: Negative for frequency, hematuria, flank pain and difficulty urinating.  Musculoskeletal: Negative for back pain and arthralgias.  Skin: Negative for rash.  Neurological: Negative for dizziness, speech difficulty, weakness, numbness and headaches.      Allergies  Red dye and Yellow dyes (non-tartrazine)  Home Medications   Prior to Admission medications   Medication Sig Start Date End Date Taking? Authorizing Provider  aspirin EC 81 MG tablet Take 81 mg by mouth every morning. 09/20/14 09/20/15  Historical Provider, MD  loratadine (CLARITIN) 10 MG tablet Take 10 mg by mouth daily as needed for allergies.    Historical Provider, MD  LORazepam (ATIVAN) 0.5 MG tablet Take 1 tablet (0.5 mg total) by mouth 2 (two) times daily as needed for anxiety. 10/16/14   Ambrose FinlandValerie A Keck, NP  meclizine (ANTIVERT) 12.5 MG tablet Take 1 tablet (12.5 mg total) by mouth 3 (three) times daily as needed for dizziness. 09/18/14   Lyndal Pulleyaniel Knott, MD  naproxen sodium (ANAPROX) 220 MG tablet Take 220 mg by mouth 2 (two) times daily with a meal.    Historical Provider, MD  omeprazole (PRILOSEC) 40  MG capsule Take 1 capsule (40 mg total) by mouth daily. 10/16/14   [name], NP  pantoprazole (PROTONIX) 40 MG tablet Take 1 tablet (40 mg total) by mouth daily. 10/16/14   [name], NP   BP 118/81 mmHg  Pulse 69  Temp(Src) 97.2 F (36.2 C) (Oral)  Resp 17  Ht  (1.956 m)  Wt 308 lb (139.708 kg)  BMI 36.52 kg/m2  SpO2 98% Physical Exam  Constitutional: He is oriented to person, place, and time. He appears well-developed and well-nourished.  HENT:  Head: Normocephalic and  atraumatic.  Eyes: Pupils are equal, round, and reactive to light.  Neck: Normal range of motion. Neck supple.  Cardiovascular: Normal rate, regular rhythm and normal heart sounds.   Pulmonary/Chest: Effort normal and breath sounds normal. No respiratory distress. He has no wheezes. He has no rales. He exhibits no tenderness.  Abdominal: Soft. Bowel sounds are normal. There is no tenderness. There is no rebound and no guarding.  Musculoskeletal: Normal range of motion. He exhibits no edema.  No edema or calf tenderness  Lymphadenopathy:    He has no cervical adenopathy.  Neurological: He is alert and oriented to person, place, and time.  Skin: Skin is warm and dry. No rash noted.  Psychiatric: He has a normal mood and affect.    ED Course  Procedures (including critical care time) Labs Review Labs Reviewed  BASIC METABOLIC PANEL - Abnormal; Notable for the following:    Glucose, Bld 105 (*)    All other components within normal limits  CBC  I-STAT TROPOININ, ED    Imaging Review Dg Chest 2 View  10/27/2014   CLINICAL DATA:  Chest pain started Saturday. Midsternal pain radiating to left side of the chest.  EXAM: CHEST  2 VIEW  COMPARISON:  None.  FINDINGS: The heart size and mediastinal contours are within normal limits. Both lungs are clear. The visualized skeletal structures are unremarkable.  IMPRESSION: No active cardiopulmonary disease.   Electronically Signed   By: Elige Ko   On: 10/27/2014 12:56     EKG Interpretation   Date/Time:  Monday October 27 2014 11:38:27 EDT Ventricular Rate:  79 PR Interval:  148 QRS Duration: 92 QT Interval:  380 QTC Calculation: 435 R Axis:   71 Text Interpretation:  Normal sinus rhythm Normal ECG since last tracing no  significant change Confirmed by Niamya Vittitow  MD, Jaylie Neaves ([id number]) on 10/27/2014  2:37:06 PM      MDM   Final diagnoses:  Chest pain, unspecified chest pain type    Patient's troponin is negative. His EKG does not show  any ischemic changes. His chest x-rays negative for pneumonia or pneumothorax. He does not have symptoms that we more consistent with a PE. He was offered a GI cocktail but refused it. He has a follow-up appointment this week with the [facility]. He also has an upcoming appointment in September with gastroenterology. I advised her return if his symptoms worsen.    Rolan Bucco, MD 10/27/14 [phone]

## 2014-10-27 NOTE — ED Notes (Signed)
Pt verbalizes understanding of d/c instructions and denies any further needs at this time. 

## 2014-10-27 NOTE — ED Notes (Signed)
Pt reports onset CP Saturday, midsternal radiating into L side of chest. The pain has been intermittent since and returned today while at rest. He noticed the pain is increased after eating yogurt and decreased after tylenol. He has changed his dietary habits recently trying to increased healthy foods. He denies nausea, sob. He is A&Ox4, resp e/u

## 2014-10-30 ENCOUNTER — Telehealth: Payer: Self-pay | Admitting: Internal Medicine

## 2014-10-30 NOTE — Telephone Encounter (Signed)
Patient called to request a med refill for LORazepam (ATIVAN) 0.5 MG tablet. Please f/u with pt.

## 2014-11-04 ENCOUNTER — Emergency Department (HOSPITAL_COMMUNITY)
Admission: EM | Admit: 2014-11-04 | Discharge: 2014-11-04 | Disposition: A | Discharge status: Home / Self Care | Payer: Self-pay | Attending: Emergency Medicine | Admitting: Emergency Medicine

## 2014-11-04 ENCOUNTER — Ambulatory Visit: Payer: Self-pay | Attending: Internal Medicine | Admitting: Internal Medicine

## 2014-11-04 ENCOUNTER — Telehealth: Payer: Self-pay | Admitting: General Practice

## 2014-11-04 ENCOUNTER — Encounter: Payer: Self-pay | Admitting: Internal Medicine

## 2014-11-04 ENCOUNTER — Encounter (HOSPITAL_COMMUNITY): Payer: Self-pay | Admitting: *Deleted

## 2014-11-04 ENCOUNTER — Emergency Department (HOSPITAL_COMMUNITY): Payer: Self-pay

## 2014-11-04 VITALS — BP 129/87 | HR 74 | Temp 97.9°F | Resp 18 | Ht 77.0 in | Wt 311.4 lb

## 2014-11-04 DIAGNOSIS — Z7982 Long term (current) use of aspirin: Secondary | ICD-10-CM | POA: Insufficient documentation

## 2014-11-04 DIAGNOSIS — Z79899 Other long term (current) drug therapy: Secondary | ICD-10-CM | POA: Insufficient documentation

## 2014-11-04 DIAGNOSIS — R079 Chest pain, unspecified: Secondary | ICD-10-CM | POA: Insufficient documentation

## 2014-11-04 DIAGNOSIS — K219 Gastro-esophageal reflux disease without esophagitis: Secondary | ICD-10-CM | POA: Insufficient documentation

## 2014-11-04 DIAGNOSIS — F419 Anxiety disorder, unspecified: Secondary | ICD-10-CM | POA: Insufficient documentation

## 2014-11-04 DIAGNOSIS — Z87891 Personal history of nicotine dependence: Secondary | ICD-10-CM | POA: Insufficient documentation

## 2014-11-04 LAB — CBC
HCT: 38.4 % — ABNORMAL LOW (ref 39.0–52.0)
Hemoglobin: 13.3 g/dL (ref 13.0–17.0)
MCH: 29.6 pg (ref 26.0–34.0)
MCHC: 34.6 g/dL (ref 30.0–36.0)
MCV: 85.3 fL (ref 78.0–100.0)
PLATELETS: 275 10*3/uL (ref 150–400)
RBC: 4.5 MIL/uL (ref 4.22–5.81)
RDW: 13.9 % (ref 11.5–15.5)
WBC: 6.9 10*3/uL (ref 4.0–10.5)

## 2014-11-04 LAB — BASIC METABOLIC PANEL
ANION GAP: 7 (ref 5–15)
BUN: 7 mg/dL (ref 6–20)
CO2: 23 mmol/L (ref 22–32)
Calcium: 9.1 mg/dL (ref 8.9–10.3)
Chloride: 107 mmol/L (ref 101–111)
Creatinine, Ser: 1.02 mg/dL (ref 0.61–1.24)
GFR calc Af Amer: >60 mL/min (ref >60)
GFR calc non Af Amer: >60 mL/min (ref >60)
GLUCOSE: 131 mg/dL — AB (ref 65–99)
POTASSIUM: 3.4 mmol/L — AB (ref 3.5–5.1)
Sodium: 137 mmol/L (ref 135–145)

## 2014-11-04 LAB — I-STAT TROPONIN, ED: Troponin i, poc: 0 ng/mL (ref 0.00–0.08)

## 2014-11-04 LAB — D-DIMER, QUANTITATIVE (NOT AT ARMC): D-Dimer, Quant: <0.27 ug/mL-FEU (ref 0.00–0.48)

## 2014-11-04 MED ORDER — BUSPIRONE HCL 10 MG PO TABS: 10.0000 mg | ORAL_TABLET | Freq: Two times a day (BID) | ORAL | Status: DC

## 2014-11-04 MED ORDER — OMEPRAZOLE 40 MG PO CPDR: 40.0000 mg | DELAYED_RELEASE_CAPSULE | Freq: Every day | ORAL | Status: DC

## 2014-11-04 NOTE — ED Notes (Signed)
Patient presents stating he was laying down and got a real sharp pain in the center of his chest and he sat up and burped and felt a little better.  He laid back down and it started again.  Stated he thinks it may have something to do with the fact he is out of his anxiety meds.  See your MD Tuesday

## 2014-11-04 NOTE — Telephone Encounter (Signed)
Patient presents to front office to speak to clinical staff in regards to medication busPIRone (BUSPAR) 10 MG tablet. Patient states he reviewed the side effects to the medications and noticed several side effects and would like to know if he could be prescribed something with less side effects. Please assist

## 2014-11-04 NOTE — Progress Notes (Signed)
Patient ID: Preston [name], male   DOB: 08/14/1980, 34 y.o.   MRN: [id number]  CC: anxiety  HPI: Preston McmurrayBrandon [name] is a 34 y.o. male here today for a follow up visit.  Patient has past medical history of marijuana use, chest pain, and anxiety. Patient was seen here 4 weeks ago for persistent chest pain and anxiety. He was given a short course of ativan and told to follow up for review. Review of charts reveal that patient has been to the ER twice in the past 4 weeks for symptoms of chest pain. He has used all of his ativan and notes that the ativan did help him to feel more relaxed. He reports improvement in acid reflux since beginning Omeprazole. He has a GI appointment in September. He denies chest pain today.   Allergies  Allergen Reactions  . Red Dye Itching  . Yellow Dyes (Non-Tartrazine) Itching   Past Medical History  Diagnosis Date  . Depression   . Anxiety   . Gastroparesis   . Acid reflux    Current Outpatient Prescriptions on File Prior to Visit  Medication Sig Dispense Refill  . aspirin EC 81 MG tablet Take 81 mg by mouth every morning.    . loratadine (CLARITIN) 10 MG tablet Take 10 mg by mouth daily as needed for allergies.    Marland Kitchen. LORazepam (ATIVAN) 0.5 MG tablet Take 1 tablet (0.5 mg total) by mouth 2 (two) times daily as needed for anxiety. 30 tablet 0  . omeprazole (PRILOSEC) 40 MG capsule Take 1 capsule (40 mg total) by mouth daily. 30 capsule 1  . meclizine (ANTIVERT) 12.5 MG tablet Take 1 tablet (12.5 mg total) by mouth 3 (three) times daily as needed for dizziness. (Patient not taking: Reported on 11/04/2014) 30 tablet 0   No current facility-administered medications on file prior to visit.   Family History  Problem Relation Age of Onset  . Cancer Father   . Diabetes Father   . Heart attack Other    History   Social History  . Marital Status: Single    Spouse Name: N/A  . Number of Children: N/A  . Years of Education: N/A   Occupational History  . Not on  file.   Social History Main Topics  . Smoking status: Former Smoker -- 0.00 packs/day    Types: Cigars    Quit date: 09/06/2014  . Smokeless tobacco: Never Used  . Alcohol Use: No     Comment: social  . Drug Use: No     Comment: last use one month ago (10/07/14)  . Sexual Activity: Not on file   Other Topics Concern  . Not on file   Social History Narrative    Review of Systems  Cardiovascular: Positive for chest pain. Negative for palpitations and leg swelling.  Gastrointestinal: Positive for heartburn. Negative for nausea, vomiting and abdominal pain.  Psychiatric/Behavioral: Negative for depression. The patient is nervous/anxious.   All other systems reviewed and are negative.   Objective:   Filed Vitals:   11/04/14 1605  BP: 129/87  Pulse: 74  Temp: 97.9 F (36.6 C)  Resp: 18    Physical Exam  Constitutional: He is oriented to person, place, and time.  Cardiovascular: Normal rate, regular rhythm and normal heart sounds.   Pulmonary/Chest: Effort normal and breath sounds normal.  Abdominal: Soft. Bowel sounds are normal. He exhibits no distension. There is no tenderness.  Musculoskeletal: He exhibits no tenderness.  Neurological: He is alert and  oriented to person, place, and time.  Skin: Skin is warm and dry.    Lab Results  Component Value Date   WBC 6.9 11/04/2014   HGB 13.3 11/04/2014   HCT 38.4* 11/04/2014   MCV 85.3 11/04/2014   PLT 275 11/04/2014   Lab Results  Component Value Date   CREATININE 1.02 11/04/2014   BUN 7 11/04/2014   NA 137 11/04/2014   K 3.4* 11/04/2014   CL 107 11/04/2014   CO2 23 11/04/2014    No results found for: HGBA1C Lipid Panel  No results found for: CHOL, TRIG, HDL, CHOLHDL, VLDL, LDLCALC     Assessment and plan:   Preston [name] was seen today for follow-up.  Diagnoses and all orders for this visit:  Anxiety Orders: -    Begin busPIRone (BUSPAR) 10 MG tablet; Take 1 tablet (10 mg total) by mouth 2 (two) times  daily. I believe many of his symptoms are due to anxiety. I will begin him on Buspar daily and have him to follow up with [facility] the [place].  Gastroesophageal reflux disease, esophagitis presence not specified Orders: -     Refill omeprazole (PRILOSEC) 40 MG capsule; Take 1 capsule (40 mg total) by mouth daily. Discussed diet and weight with patient relating to acid reflux.  Went over things that may exacerbate acid reflux such as tomatoes, spicy foods, coffee, carbonated beverages, chocolates, etc.  Advised patient to avoid laying down at least two hours after meals and sleep with HOB elevated.   Return in about 6 weeks (around 12/16/2014) for anxiety.       Preston Finland, NP-C [facility] [phone] 11/04/2014, 4:10 PM\

## 2014-11-04 NOTE — Progress Notes (Signed)
Patient here for follow up for anxiety. Patient reports pain located across his chest, left side, and left side of neck. Pain rated at a 2, described as sharp for the chest, sore and shooting for his left side, and tight for his neck. Pain started last Thursday. Pain comes and goes. Patient has been out of Ativan since last Thursday and needs a refill on it. Patient reports he feels his pain is coming from his anxiety since he has been out of medications since Thursday.

## 2014-11-04 NOTE — Patient Instructions (Signed)
Buspirone tablets  What is this medicine?  BUSPIRONE (byoo SPYE rone) is used to treat anxiety disorders.  This medicine may be used for other purposes; ask your health care provider or pharmacist if you have questions.  COMMON BRAND NAME(S): BuSpar  What should I tell my health care provider before I take this medicine?  They need to know if you have any of these conditions:  -kidney or liver disease  -an unusual or allergic reaction to buspirone, other medicines, foods, dyes, or preservatives  -pregnant or trying to get pregnant  -breast-feeding  How should I use this medicine?  Take this medicine by mouth with a glass of water. Follow the directions on the prescription label. You may take this medicine with or without food. To ensure that this medicine always works the same way for you, you should take it either always with or always without food. Take your doses at regular intervals. Do not take your medicine more often than directed. Do not stop taking except on the advice of your doctor or health care professional.  Talk to your pediatrician regarding the use of this medicine in children. Special care may be needed.  Overdosage: If you think you have taken too much of this medicine contact a poison control center or emergency room at once.  NOTE: This medicine is only for you. Do not share this medicine with others.  What if I miss a dose?  If you miss a dose, take it as soon as you can. If it is almost time for your next dose, take only that dose. Do not take double or extra doses.  What may interact with this medicine?  Do not take this medicine with any of the following medications:  -linezolid  -MAOIs like Carbex, Eldepryl, Marplan, Nardil, and Parnate  -methylene blue  -procarbazine  This medicine may also interact with the following medications:  -diazepam  -digoxin  -diltiazem  -erythromycin  -grapefruit juice  -haloperidol  -medicines for mental depression or mood problems  -medicines for seizures like  carbamazepine, phenobarbital and phenytoin  -nefazodone  -other medications for anxiety  -rifampin  -ritonavir  -some antifungal medicines like itraconazole, ketoconazole, and voriconazole  -verapamil  -warfarin  This list may not describe all possible interactions. Give your health care provider a list of all the medicines, herbs, non-prescription drugs, or dietary supplements you use. Also tell them if you smoke, drink alcohol, or use illegal drugs. Some items may interact with your medicine.  What should I watch for while using this medicine?  Visit your doctor or health care professional for regular checks on your progress. It may take 1 to 2 weeks before your anxiety gets better.  You may get drowsy or dizzy. Do not drive, use machinery, or do anything that needs mental alertness until you know how this drug affects you. Do not stand or sit up quickly, especially if you are an older patient. This reduces the risk of dizzy or fainting spells. Alcohol can make you more drowsy and dizzy. Avoid alcoholic drinks.  What side effects may I notice from receiving this medicine?  Side effects that you should report to your doctor or health care professional as soon as possible:  -blurred vision or other vision changes  -chest pain  -confusion  -difficulty breathing  -feelings of hostility or anger  -muscle aches and pains  -numbness or tingling in hands or feet  -ringing in the ears  -skin rash and itching  -vomiting  -  weakness  Side effects that usually do not require medical attention (report to your doctor or health care professional if they continue or are bothersome):  -disturbed dreams, nightmares  -headache  -nausea  -restlessness or nervousness  -sore throat and nasal congestion  -stomach upset  This list may not describe all possible side effects. Call your doctor for medical advice about side effects. You may report side effects to FDA at 1-800-FDA-1088.  Where should I keep my medicine?  Keep out of the reach  of children.  Store at room temperature below 30 degrees C (86 degrees F). Protect from light. Keep container tightly closed. Throw away any unused medicine after the expiration date.  NOTE: This sheet is a summary. It may not cover all possible information. If you have questions about this medicine, talk to your doctor, pharmacist, or health care provider.  © 2015, [employer]vier/[employer]. (2009-11-12 18:06:11)

## 2014-11-04 NOTE — ED Provider Notes (Signed)
CSN: [id number]     Arrival date & time 11/04/14  0106 History  This chart was scribed for Mirian Mo, MD by Evon Slack, ED Scribe. This patient was seen in room A06C/A06C and the patient's care was started at 2:23 AM.      Chief Complaint  Patient presents with  . Chest Pain   Patient is a 34 y.o. male presenting with chest pain. The history is provided by the patient. No language interpreter was used.  Chest Pain Pain quality: sharp   Pain severity:  Mild Timing:  Constant Chronicity:  Recurrent Context: at rest   Relieved by:  Nothing Worsened by:  Nothing tried Associated symptoms: no fever, no nausea and not vomiting    HPI Comments: Preston [name] is a 34 y.o. male with PMHx depression, anxiety, gastroparesis and acid reflux who presents to the Emergency Department complaining of sharp constant  CP onset earlier yesterday. Pt states that he was sitting down at a cookout during the onset of pain. Pt states that he had some slight shortness of breath associated with the pain. Pt doesn't report any alleviating or worsening factors. Pt states that this pain feels similar to previous CP. Pt denies n/v/d, fever, diarrhea or constipation. Denies any recent tobacco or marijuana use. Pt denies any ETOH use. States pain is identical to prior flares.  Past Medical History  Diagnosis Date  . Depression   . Anxiety   . Gastroparesis   . Acid reflux    Past Surgical History  Procedure Laterality Date  . No past surgeries     Family History  Problem Relation Age of Onset  . Cancer Father   . Diabetes Father   . Heart attack Other    History  Substance Use Topics  . Smoking status: Former Smoker -- 0.00 packs/day    Types: Cigars    Quit date: 09/06/2014  . Smokeless tobacco: Never Used  . Alcohol Use: No     Comment: social    Review of Systems  Constitutional: Negative for fever.  Cardiovascular: Positive for chest pain.  Gastrointestinal: Negative for nausea,  vomiting, diarrhea and constipation.  All other systems reviewed and are negative.    Allergies  Red dye and Yellow dyes (non-tartrazine)  Home Medications   Prior to Admission medications   Medication Sig Start Date End Date Taking? Authorizing Provider  aspirin EC 81 MG tablet Take 81 mg by mouth every morning. 09/20/14 09/20/15 Yes Historical Provider, MD  loratadine (CLARITIN) 10 MG tablet Take 10 mg by mouth daily as needed for allergies.   Yes Historical Provider, MD  LORazepam (ATIVAN) 0.5 MG tablet Take 1 tablet (0.5 mg total) by mouth 2 (two) times daily as needed for anxiety. 10/16/14  Yes [name], NP  meclizine (ANTIVERT) 12.5 MG tablet Take 1 tablet (12.5 mg total) by mouth 3 (three) times daily as needed for dizziness. 09/18/14  Yes Lyndal Pulley, MD  omeprazole (PRILOSEC) 40 MG capsule Take 1 capsule (40 mg total) by mouth daily. 10/16/14  Yes [name], NP  pantoprazole (PROTONIX) 40 MG tablet Take 1 tablet (40 mg total) by mouth daily. 10/16/14  Yes [name], NP   BP 116/70 mmHg  Pulse 53  Temp(Src) 97.9 F (36.6 C) (Oral)  Resp 16  Ht  (1.956 m)  Wt 309 lb 12.8 oz (140.524 kg)  BMI 36.73 kg/m2  SpO2 97%   Physical Exam  Constitutional: He is oriented to  person, place, and time. He appears well-developed and well-nourished.  HENT:  Head: Normocephalic and atraumatic.  Eyes: Conjunctivae and EOM are normal.  Neck: Normal range of motion. Neck supple.  Cardiovascular: Normal rate, regular rhythm and normal heart sounds.   Pulmonary/Chest: Effort normal and breath sounds normal. No respiratory distress.  Abdominal: He exhibits no distension. There is no tenderness. There is no rebound and no guarding.  Musculoskeletal: Normal range of motion.  Neurological: He is alert and oriented to person, place, and time.  Skin: Skin is warm and dry.  Vitals reviewed.   ED Course  Procedures (including critical care time) DIAGNOSTIC STUDIES: Oxygen  Saturation is 98% on RA, normal by my interpretation.    COORDINATION OF CARE: 3:19 AM-Discussed treatment plan with pt at bedside and pt agreed to plan.     Labs Review Labs Reviewed  BASIC METABOLIC PANEL - Abnormal; Notable for the following:    Potassium 3.4 (*)    Glucose, Bld 131 (*)    All other components within normal limits  CBC - Abnormal; Notable for the following:    HCT 38.4 (*)    All other components within normal limits  D-DIMER, QUANTITATIVE (NOT AT [facility])  Rosezena SensorI-STAT TROPOININ, ED    Imaging Review Dg Chest 2 View  11/04/2014   CLINICAL DATA:  34 year old male with chest pain  EXAM: CHEST  2 VIEW  COMPARISON:  10/27/2014  FINDINGS: The heart size and mediastinal contours are within normal limits. Both lungs are clear. The visualized skeletal structures are unremarkable.  IMPRESSION: No active cardiopulmonary disease.   Electronically Signed   By: Elgie CollardArash  Radparvar M.D.   On: 11/04/2014 02:23     EKG Interpretation   Date/Time:  Tuesday November 04 2014 01:13:08 EDT Ventricular Rate:  54 PR Interval:  172 QRS Duration: 100 QT Interval:  432 QTC Calculation: 409 R Axis:   62 Text Interpretation:  Sinus bradycardia with sinus arrhythmia Otherwise  normal ECG No significant change since last tracing Although rate has  decreased Confirmed by Mirian MoGentry, Matthew [phone]([id number]) on 11/04/2014 2:32:07 AM      MDM   Final diagnoses:  Chest pain, unspecified chest pain type      34 y.o. male with pertinent PMH of chronic chest pain thought likely GERD with recent negative cardiology wu and planned GI fu presents with recurrent chest pain as above. No fever, cough, systemic symptoms. Pain atypical for ACS, seems likely reflux related. Physical exam benign. Workup unremarkable. DC home in stable condition to follow-up with GI..    I have reviewed all laboratory and imaging studies if ordered as above  1. Chest pain, unspecified chest pain type           Mirian MoMatthew Gentry,  MD 11/04/14 [phone]

## 2014-11-04 NOTE — Discharge Instructions (Signed)

## 2014-11-06 ENCOUNTER — Emergency Department (HOSPITAL_COMMUNITY)
Admission: EM | Admit: 2014-11-06 | Discharge: 2014-11-06 | Disposition: A | Discharge status: Home / Self Care | Payer: Self-pay | Attending: Emergency Medicine | Admitting: Emergency Medicine

## 2014-11-06 ENCOUNTER — Encounter (HOSPITAL_COMMUNITY): Payer: Self-pay

## 2014-11-06 ENCOUNTER — Emergency Department (HOSPITAL_COMMUNITY): Payer: Self-pay

## 2014-11-06 DIAGNOSIS — R0602 Shortness of breath: Secondary | ICD-10-CM | POA: Insufficient documentation

## 2014-11-06 DIAGNOSIS — Z8719 Personal history of other diseases of the digestive system: Secondary | ICD-10-CM

## 2014-11-06 DIAGNOSIS — Z79899 Other long term (current) drug therapy: Secondary | ICD-10-CM | POA: Insufficient documentation

## 2014-11-06 DIAGNOSIS — K219 Gastro-esophageal reflux disease without esophagitis: Secondary | ICD-10-CM | POA: Insufficient documentation

## 2014-11-06 DIAGNOSIS — F419 Anxiety disorder, unspecified: Secondary | ICD-10-CM | POA: Insufficient documentation

## 2014-11-06 DIAGNOSIS — Z7982 Long term (current) use of aspirin: Secondary | ICD-10-CM | POA: Insufficient documentation

## 2014-11-06 DIAGNOSIS — R110 Nausea: Secondary | ICD-10-CM | POA: Insufficient documentation

## 2014-11-06 DIAGNOSIS — R079 Chest pain, unspecified: Secondary | ICD-10-CM

## 2014-11-06 DIAGNOSIS — R071 Chest pain on breathing: Secondary | ICD-10-CM | POA: Insufficient documentation

## 2014-11-06 DIAGNOSIS — Z87891 Personal history of nicotine dependence: Secondary | ICD-10-CM | POA: Insufficient documentation

## 2014-11-06 DIAGNOSIS — R0789 Other chest pain: Secondary | ICD-10-CM

## 2014-11-06 LAB — BASIC METABOLIC PANEL
Anion gap: 6 (ref 5–15)
BUN: 8 mg/dL (ref 6–20)
CO2: 25 mmol/L (ref 22–32)
CREATININE: 1.11 mg/dL (ref 0.61–1.24)
Calcium: 9.8 mg/dL (ref 8.9–10.3)
Chloride: 107 mmol/L (ref 101–111)
GLUCOSE: 108 mg/dL — AB (ref 65–99)
Potassium: 3.2 mmol/L — ABNORMAL LOW (ref 3.5–5.1)
SODIUM: 138 mmol/L (ref 135–145)

## 2014-11-06 LAB — CBC
HEMATOCRIT: 41 % (ref 39.0–52.0)
HEMOGLOBIN: 14.1 g/dL (ref 13.0–17.0)
MCH: 29.2 pg (ref 26.0–34.0)
MCHC: 34.4 g/dL (ref 30.0–36.0)
MCV: 84.9 fL (ref 78.0–100.0)
Platelets: 281 10*3/uL (ref 150–400)
RBC: 4.83 MIL/uL (ref 4.22–5.81)
RDW: 13.7 % (ref 11.5–15.5)
WBC: 7.1 10*3/uL (ref 4.0–10.5)

## 2014-11-06 LAB — I-STAT TROPONIN, ED: Troponin i, poc: 0 ng/mL (ref 0.00–0.08)

## 2014-11-06 MED ORDER — LORAZEPAM 1 MG PO TABS: 1.0000 mg | ORAL_TABLET | Freq: Every day | ORAL | Status: DC | PRN

## 2014-11-06 NOTE — Discharge Instructions (Signed)
We saw you in the ER for the chest pain/shortness of breath. All of our cardiac workup is normal, including labs, EKG and chest X-RAY are normal. We are not sure what is causing your discomfort, but we feel comfortable sending you home at this time. The workup in the ER is not complete, and you should follow up with your primary care doctor for further evaluation.   Chest Wall Pain Chest wall pain is pain in or around the bones and muscles of your chest. It may take up to 6 weeks to get better. It may take longer if you must stay physically active in your work and activities.  CAUSES  Chest wall pain may happen on its own. However, it may be caused by:  A viral illness like the flu.  Injury.  Coughing.  Exercise.  Arthritis.  Fibromyalgia.  Shingles. HOME CARE INSTRUCTIONS   Avoid overtiring physical activity. Try not to strain or perform activities that cause pain. This includes any activities using your chest or your abdominal and side muscles, especially if heavy weights are used.  Put ice on the sore area.  Put ice in a plastic bag.  Place a towel between your skin and the bag.  Leave the ice on for 15-20 minutes per hour while awake for the first 2 days.  Only take over-the-counter or prescription medicines for pain, discomfort, or fever as directed by your caregiver. SEEK IMMEDIATE MEDICAL CARE IF:   Your pain increases, or you are very uncomfortable.  You have a fever.  Your chest pain becomes worse.  You have new, unexplained symptoms.  You have nausea or vomiting.  You feel sweaty or lightheaded.  You have a cough with phlegm (sputum), or you cough up blood. MAKE SURE YOU:   Understand these instructions.  Will watch your condition.  Will get help right away if you are not doing well or get worse. Document Released: 04/04/2005 Document Revised: 06/27/2011 Document Reviewed: 11/29/2010 [facility] Patient Information 2015 [place], [place]. This  information is not intended to replace advice given to you by your health care provider. Make sure you discuss any questions you have with your health care provider.  Costochondritis Costochondritis, sometimes called Tietze syndrome, is a swelling and irritation (inflammation) of the tissue (cartilage) that connects your ribs with your breastbone (sternum). It causes pain in the chest and rib area. Costochondritis usually goes away on its own over time. It can take up to 6 weeks or longer to get better, especially if you are unable to limit your activities. CAUSES  Some cases of costochondritis have no known cause. Possible causes include:  Injury (trauma).  Exercise or activity such as lifting.  Severe coughing. SIGNS AND SYMPTOMS  Pain and tenderness in the chest and rib area.  Pain that gets worse when coughing or taking deep breaths.  Pain that gets worse with specific movements. DIAGNOSIS  Your health care provider will do a physical exam and ask about your symptoms. Chest X-rays or other tests may be done to rule out other problems. TREATMENT  Costochondritis usually goes away on its own over time. Your health care provider may prescribe medicine to help relieve pain. HOME CARE INSTRUCTIONS   Avoid exhausting physical activity. Try not to strain your ribs during normal activity. This would include any activities using chest, abdominal, and side muscles, especially if heavy weights are used.  Apply ice to the affected area for the first 2 days after the pain begins.  Put  ice in a plastic bag.  Place a towel between your skin and the bag.  Leave the ice on for 20 minutes, 2-3 times a day.  Only take over-the-counter or prescription medicines as directed by your health care provider. SEEK MEDICAL CARE IF:  You have redness or swelling at the rib joints. These are signs of infection.  Your pain does not go away despite rest or medicine. SEEK IMMEDIATE MEDICAL CARE IF:    Your pain increases or you are very uncomfortable.  You have shortness of breath or difficulty breathing.  You cough up blood.  You have worse chest pains, sweating, or vomiting.  You have a fever or persistent symptoms for more than 2-3 days.  You have a fever and your symptoms suddenly get worse. MAKE SURE YOU:   Understand these instructions.  Will watch your condition.  Will get help right away if you are not doing well or get worse. Document Released: 01/12/2005 Document Revised: 01/23/2013 Document Reviewed: 11/06/2012 [facility] Patient Information 2015 [place], [place]. This information is not intended to replace advice given to you by your health care provider. Make sure you discuss any questions you have with your health care provider.

## 2014-11-06 NOTE — ED Provider Notes (Signed)
CSN: [id number]     Arrival date & time 11/06/14  0421 History   First MD Initiated Contact with Patient 11/06/14 [phone]     Chief Complaint  Patient presents with  . Chest Pain     (Consider location/radiation/quality/duration/timing/severity/associated sxs/prior Treatment) Patient is a 34 y.o. male presenting with chest pain. The history is provided by the patient.  Chest Pain Pain location:  Substernal area and L lateral chest Pain radiates to:  Does not radiate Pain radiates to the back: no   Pain severity:  Moderate Onset quality:  Sudden Duration:  2 hours Progression:  Waxing and waning Chronicity:  Recurrent Context: not breathing, no movement and no trauma   Associated symptoms: dizziness, nausea and shortness of breath   Associated symptoms: no diaphoresis, no near-syncope and no numbness   Risk factors: smoking     Past Medical History  Diagnosis Date  . Depression   . Anxiety   . Gastroparesis   . Acid reflux    Past Surgical History  Procedure Laterality Date  . No past surgeries     Family History  Problem Relation Age of Onset  . Cancer Father   . Diabetes Father   . Heart attack Other    History  Substance Use Topics  . Smoking status: Former Smoker -- 0.00 packs/day    Types: Cigars    Quit date: 09/06/2014  . Smokeless tobacco: Never Used  . Alcohol Use: No     Comment: social    Review of Systems  Constitutional: Negative for diaphoresis.  Respiratory: Positive for shortness of breath.   Cardiovascular: Positive for chest pain. Negative for near-syncope.  Gastrointestinal: Positive for nausea.  Neurological: Positive for dizziness. Negative for numbness.      Allergies  Red dye and Yellow dyes (non-tartrazine)  Home Medications   Prior to Admission medications   Medication Sig Start Date End Date Taking? Authorizing Provider  aspirin EC 81 MG tablet Take 81 mg by mouth every morning. 09/20/14 09/20/15 Yes Historical Provider, MD   busPIRone (BUSPAR) 10 MG tablet Take 1 tablet (10 mg total) by mouth 2 (two) times daily. 11/04/14  Yes [name], NP  loratadine (CLARITIN) 10 MG tablet Take 10 mg by mouth daily as needed for allergies.   Yes Historical Provider, MD  omeprazole (PRILOSEC) 40 MG capsule Take 1 capsule (40 mg total) by mouth daily. 11/04/14  Yes [name], NP  LORazepam (ATIVAN) 1 MG tablet Take 1 tablet (1 mg total) by mouth daily as needed for anxiety. 11/06/14   [name], MD  meclizine (ANTIVERT) 12.5 MG tablet Take 1 tablet (12.5 mg total) by mouth 3 (three) times daily as needed for dizziness. Patient not taking: Reported on 11/04/2014 09/18/14   Lyndal Pulley, MD   BP 113/63 mmHg  Pulse 49  Temp(Src) 97.7 F (36.5 C) (Oral)  Resp 10  SpO2 97% Physical Exam  Constitutional: He is oriented to person, place, and time. He appears well-developed.  HENT:  Head: Normocephalic and atraumatic.  Eyes: Conjunctivae and EOM are normal. Pupils are equal, round, and reactive to light.  Neck: Normal range of motion. Neck supple.  Cardiovascular: Normal rate and regular rhythm.   Pulmonary/Chest: Effort normal and breath sounds normal.  Abdominal: Soft. Bowel sounds are normal. He exhibits no distension. There is no tenderness. There is no rebound and no guarding.  Neurological: He is alert and oriented to person, place, and time.  Skin: Skin is warm.  Nursing note and vitals reviewed.   ED Course  Procedures (including critical care time) Labs Review Labs Reviewed  BASIC METABOLIC PANEL - Abnormal; Notable for the following:    Potassium 3.2 (*)    Glucose, Bld 108 (*)    All other components within normal limits  CBC  I-STAT TROPOININ, ED    Imaging Review Dg Chest 2 View  11/06/2014   CLINICAL DATA:  Initial evaluation for acute chest pain.  EXAM: CHEST  2 VIEW  COMPARISON:  Prior radiograph from 11/04/2014  FINDINGS: The cardiac and mediastinal silhouettes are stable in size and contour,  and remain within normal limits.  The lungs are normally inflated. No airspace consolidation, pleural effusion, or pulmonary edema is identified. There is no pneumothorax.  No acute osseous abnormality identified.  IMPRESSION: No active cardiopulmonary disease.   Electronically Signed   By: Rise Mu M.D.   On: 11/06/2014 05:29     EKG Interpretation None      Date: 11/06/2014  Rate: 58  Rhythm: normal sinus rhythm  QRS Axis: normal  Intervals: normal  ST/T Wave abnormalities: normal  Conduction Disutrbances: none  Narrative Interpretation: unremarkable      MDM   Final diagnoses:  History of gastroesophageal reflux (GERD)  Costochondral chest pain  Chest pain, unspecified chest pain type    Pt comes in with cc of chest pain. Chest pain is atypical, L sided, and substernal. He has had chest pains of this type in the past and has had Cards evaluation - working dx is GERD vs. Anxiety vs. Costochondritis. His EKG is normal. Trop is normal. Pt feels a lot better and is comfortable currently. He has no cardiac risk factors - but states that grandfather and uncle had MI in their 34s. Also states that buspar is not helping as much as ativan. Will give oral ativan for breakthrough - but he has been advised to continue with the meds prescribed and to see his prescribing doctor for any anxiety related med request -and he agrees to that. Will d.c.    [name], MD 11/06/14 [phone]

## 2014-11-06 NOTE — Telephone Encounter (Signed)
Pt states he is having side effects (chest pains, dizzy spells, weakness, muscle ache and pains) with busPIRone (BUSPAR) 10 MG tablet. Pt states he was not having these symptoms with previous script. Please f/u with pt.

## 2014-11-06 NOTE — ED Notes (Signed)
Pt reports CP starting this morning that woke him from sleep. Reports this has happened before w/ no known etiology. Hx of GERD.

## 2014-11-07 ENCOUNTER — Telehealth: Payer: Self-pay

## 2014-11-07 ENCOUNTER — Encounter (HOSPITAL_BASED_OUTPATIENT_CLINIC_OR_DEPARTMENT_OTHER): Payer: Self-pay | Admitting: Clinical

## 2014-11-07 ENCOUNTER — Ambulatory Visit: Payer: Self-pay | Attending: Internal Medicine

## 2014-11-07 ENCOUNTER — Telehealth: Payer: Self-pay | Admitting: Internal Medicine

## 2014-11-07 VITALS — BP 108/70 | HR 76 | Temp 98.1°F | Resp 18 | Ht 77.0 in | Wt 304.0 lb

## 2014-11-07 DIAGNOSIS — M7918 Myalgia, other site: Secondary | ICD-10-CM

## 2014-11-07 DIAGNOSIS — M791 Myalgia: Secondary | ICD-10-CM

## 2014-11-07 MED ORDER — MELOXICAM 15 MG PO TABS: 15.0000 mg | ORAL_TABLET | Freq: Every day | ORAL | Status: DC

## 2014-11-07 NOTE — Telephone Encounter (Signed)
Patient seen in clinic today for nurse visit concerning chest pain.

## 2014-11-07 NOTE — Progress Notes (Signed)
Patient was sitting upright watching TV and had stabbing pain to mid-chest, at level "12" on 0-10 pain scale. Currently has mid-chest pain at level 7, described as pressure, "that feeling you have to burp but it doesn't want to come out". Patient drank some Maalox and seemed to soothe chest slightly. Patient reports eating 6 inch sub with [place] breast, lettuce, mushrooms, light mayo, and ate diabetic, sugar-free,oatmeal and pecan shortbread cookies, and drank zero-calorie powerade and water. Patient had dizzy spell when he called nurse on telephone and reports having "same dizzy spell last night at work". Dizzy spells last anywhere from 10 minutes to an hour. Dizzy spells and chest pain started May 30.   Patient points to mid-chest is is motioning up to base of neck and down to chest when describing chest pain.   Patient reports GI cocktail makes him feel like he can't breath. Patient reports it works but he can't stand the feeling of not being able to breathe the way he wants to breathe.   Patient ran out of anxiety medication last Thursday. Picked up anxiety med on Tuesday after appointment. Symptoms worsen while out of anxiety medication and has not improved since taking new anxiety medication.

## 2014-11-07 NOTE — Telephone Encounter (Signed)
Pt calling to schedule appt with PCP today for chest pains

## 2014-11-07 NOTE — Progress Notes (Signed)
ASSESSMENT: Pt currently experiencing symptoms of anxiety and depression, triggered by recent lifestyle changes. Pt needs to f/u with PCP and [facility]; he would benefit from psychoeducation and supportive counseling regarding coping with symptoms of anxiety and depression.  Stage of Change: contemplative  PLAN: 1. F/U with behavioral health consultant in as needed 2. Psychiatric Medications: Ativan. 3. Behavioral recommendation(s):   -Consider stop smoking -Consider reading over educational materials regarding coping with anxiety and panic attacks -Consider relaxation breathing techniques to cope with symptoms of anxiety and depression SUBJECTIVE: Pt. referred by [name]  for symptoms of anxiety and depression Pt. reports the following symptoms/concerns: Pt states that he is having a hard time adjusting to having to eat healthier foods and not being able to be as physically active, and it is causing him to feel depressed; he is also having anxiety that leads to panic attacks.  Duration of problem: 2 months Severity: moderate  OBJECTIVE: Orientation & Cognition: Oriented x3. Thought processes normal and appropriate to situation. Mood: appropriate. Affect: appropriate Appearance: appropriate Risk of harm to self or others: no risk of harm to self or others Substance use: marijuana Assessments administered: PHQ2: 1  Diagnosis: Adjustment disorder with mixed anxiety and depressed mood CPT Code: F43.23 -------------------------------------------- Other(s) present in the room: none  Time spent with patient in exam room: 20 minutes

## 2014-11-07 NOTE — Telephone Encounter (Signed)
Patient calls explaining Vikki Ports had told him to call office, prior to going to ED, when chest is hurting. Patient reports being in ED Monday and Wednesday for chest pain. Today at 2:43pm, patient was sitting upright, watching TV and "Out of nowhere there was a sharp pain, feels like a knife stabbing me, so I got up took some Maalox because I thought it was heartburn". Patient reports Maalox has soothed some. Patients mid-chest pain currently at level 8, described as aching and pressure.  Nurse spoke to [place]. Vikki Ports requested patient to come in. Patient agrees to have friend bring him to [facility] now.

## 2014-11-07 NOTE — Progress Notes (Signed)
Per providers request Meloxicam  sent to pharmacy. Hulda Marin spoke with patient and gave resources.

## 2014-11-07 NOTE — Telephone Encounter (Signed)
Patient left voicemail explaining medication could not be filled at [facility] pharmacy today. Patient was told medication would be sent to [facility] pharmacy. Medication is not there either.  Nurse called patient, reached voicemail. Left message for patient to call Nilaya Bouie with [facility], at [phone].

## 2014-11-11 ENCOUNTER — Emergency Department (HOSPITAL_COMMUNITY)
Admission: EM | Admit: 2014-11-11 | Discharge: 2014-11-11 | Disposition: A | Discharge status: Home / Self Care | Payer: Self-pay | Attending: Emergency Medicine | Admitting: Emergency Medicine

## 2014-11-11 ENCOUNTER — Emergency Department (HOSPITAL_COMMUNITY): Payer: Self-pay

## 2014-11-11 ENCOUNTER — Encounter (HOSPITAL_COMMUNITY): Payer: Self-pay | Admitting: *Deleted

## 2014-11-11 DIAGNOSIS — R0789 Other chest pain: Secondary | ICD-10-CM | POA: Insufficient documentation

## 2014-11-11 DIAGNOSIS — Z87891 Personal history of nicotine dependence: Secondary | ICD-10-CM | POA: Insufficient documentation

## 2014-11-11 DIAGNOSIS — R110 Nausea: Secondary | ICD-10-CM | POA: Insufficient documentation

## 2014-11-11 DIAGNOSIS — F419 Anxiety disorder, unspecified: Secondary | ICD-10-CM | POA: Insufficient documentation

## 2014-11-11 DIAGNOSIS — R61 Generalized hyperhidrosis: Secondary | ICD-10-CM | POA: Insufficient documentation

## 2014-11-11 DIAGNOSIS — K219 Gastro-esophageal reflux disease without esophagitis: Secondary | ICD-10-CM | POA: Insufficient documentation

## 2014-11-11 DIAGNOSIS — Z7982 Long term (current) use of aspirin: Secondary | ICD-10-CM | POA: Insufficient documentation

## 2014-11-11 DIAGNOSIS — R0602 Shortness of breath: Secondary | ICD-10-CM | POA: Insufficient documentation

## 2014-11-11 DIAGNOSIS — F329 Major depressive disorder, single episode, unspecified: Secondary | ICD-10-CM | POA: Insufficient documentation

## 2014-11-11 LAB — BASIC METABOLIC PANEL
Anion gap: 8 (ref 5–15)
BUN: 6 mg/dL (ref 6–20)
CO2: 28 mmol/L (ref 22–32)
Calcium: 9.6 mg/dL (ref 8.9–10.3)
Chloride: 101 mmol/L (ref 101–111)
Creatinine, Ser: 1.16 mg/dL (ref 0.61–1.24)
GLUCOSE: 111 mg/dL — AB (ref 65–99)
Potassium: 3.7 mmol/L (ref 3.5–5.1)
Sodium: 137 mmol/L (ref 135–145)

## 2014-11-11 LAB — CBC
HCT: 43 % (ref 39.0–52.0)
Hemoglobin: 14.9 g/dL (ref 13.0–17.0)
MCH: 29.7 pg (ref 26.0–34.0)
MCHC: 34.7 g/dL (ref 30.0–36.0)
MCV: 85.8 fL (ref 78.0–100.0)
PLATELETS: 282 10*3/uL (ref 150–400)
RBC: 5.01 MIL/uL (ref 4.22–5.81)
RDW: 13.8 % (ref 11.5–15.5)
WBC: 7.6 10*3/uL (ref 4.0–10.5)

## 2014-11-11 LAB — I-STAT TROPONIN, ED: Troponin i, poc: 0 ng/mL (ref 0.00–0.08)

## 2014-11-11 MED ORDER — KETOROLAC TROMETHAMINE 60 MG/2ML IM SOLN
60.0000 mg | Freq: Once | INTRAMUSCULAR | Status: DC
  Filled 2014-11-11: qty 2

## 2014-11-11 MED ORDER — IBUPROFEN 800 MG PO TABS
800.0000 mg | ORAL_TABLET | Freq: Once | ORAL | Status: AC
  Administered 2014-11-11: 800 mg via ORAL
  Filled 2014-11-11: qty 1

## 2014-11-11 NOTE — ED Notes (Signed)
Pt requesting to ambulate to lobby.  Gait steady and even.  Denies any dizziness.

## 2014-11-11 NOTE — ED Notes (Signed)
Pt in c/o chest pain that started while putting up a baby crib, symptoms started around 2230 tonight, pt diaphoretic, pain is worse with movement, c/o nausea, no distress noted

## 2014-11-11 NOTE — ED Notes (Signed)
Pt sts he was "taken off" of Ibuprofen by his PCP recently.  Does not recall why.  MD made aware, sts patient should take medication.

## 2014-11-11 NOTE — ED Provider Notes (Signed)
CSN: [id number]   Arrival date & time 11/11/14 0008  History  This chart was scribed for  Dione Booze, MD by Bethel Born, ED Scribe. This patient was seen in room B17C/B17C and the patient's care was started at 1:48 AM.  Chief Complaint  Patient presents with  . Chest Pain    HPI The history is provided by the patient. No language interpreter was used.   TRUST LEH is a 34 y.o. male  With PMHx of anxiety and acid reflux who presents to the Emergency Department complaining of left upper chest pain with sudden onset tonight around 10:30 PM while putting together a baby crib. The pain is described as sharp/shooting and rated 7/10 in severity. Movement and deep breathing exacerbate the pain. He notes that the pain is similar in quality to pain that he has been seen for in the past but more severe. Took nothing for pain PTA. Associated symptoms include sweating after the onset of pain, mild SOB, and nausea. Pt denies smoking, history of DM, HTN, or HLD. He has family history of MI (Grandfather in his 62s and uncle in his 73s). Scheduled cardiology appointment on 11/19/14.   Past Medical History  Diagnosis Date  . Depression   . Anxiety   . Gastroparesis   . Acid reflux     Past Surgical History  Procedure Laterality Date  . No past surgeries      Family History  Problem Relation Age of Onset  . Cancer Father   . Diabetes Father   . Heart attack Other     History  Substance Use Topics  . Smoking status: Former Smoker -- 0.00 packs/day    Types: Cigars    Quit date: 09/06/2014  . Smokeless tobacco: Never Used  . Alcohol Use: No     Comment: social     Review of Systems  Constitutional: Positive for diaphoresis.  Respiratory: Positive for shortness of breath.   Cardiovascular: Positive for chest pain.  Gastrointestinal: Positive for nausea.  All other systems reviewed and are negative.    Home Medications   Prior to Admission medications   Medication Sig Start Date  End Date Taking? Authorizing Provider  aspirin EC 81 MG tablet Take 81 mg by mouth every morning. 09/20/14 09/20/15  Historical Provider, MD  busPIRone (BUSPAR) 10 MG tablet Take 1 tablet (10 mg total) by mouth 2 (two) times daily. 11/04/14   [name], NP  loratadine (CLARITIN) 10 MG tablet Take 10 mg by mouth daily as needed for allergies.    Historical Provider, MD  LORazepam (ATIVAN) 1 MG tablet Take 1 tablet (1 mg total) by mouth daily as needed for anxiety. Patient not taking: Reported on 11/07/2014 11/06/14   [name], MD  meclizine (ANTIVERT) 12.5 MG tablet Take 1 tablet (12.5 mg total) by mouth 3 (three) times daily as needed for dizziness. Patient not taking: Reported on 11/04/2014 09/18/14   Lyndal Pulley, MD  meloxicam (MOBIC) 15 MG tablet Take 1 tablet (15 mg total) by mouth daily. 11/07/14   [name], NP  omeprazole (PRILOSEC) 40 MG capsule Take 1 capsule (40 mg total) by mouth daily. 11/04/14   [name], NP    Allergies  Red dye and Yellow dyes (non-tartrazine)  Triage Vitals: BP 122/81 mmHg  Pulse 72  Temp(Src) 98.7 F (37.1 C) (Oral)  Resp 20  Wt 308 lb (139.708 kg)  SpO2 100%  Physical Exam  Constitutional: He is oriented to  person, place, and time. He appears well-developed and well-nourished. No distress.  HENT:  Head: Normocephalic and atraumatic.  Eyes: Conjunctivae and EOM are normal. Pupils are equal, round, and reactive to light.  Neck: Normal range of motion. Neck supple. No JVD present.  Cardiovascular: Normal rate, regular rhythm and normal heart sounds.   No murmur heard. Pulmonary/Chest: Effort normal and breath sounds normal. He has no wheezes. He has no rales. He exhibits no tenderness.  Mild tenderness at left costochondral junction, reproduces his pain  Abdominal: Soft. Bowel sounds are normal. He exhibits no distension and no mass. There is no tenderness.  Musculoskeletal: Normal range of motion. He exhibits no edema.  Lymphadenopathy:     He has no cervical adenopathy.  Neurological: He is alert and oriented to person, place, and time. No cranial nerve deficit. He exhibits normal muscle tone. Coordination normal.  Skin: Skin is warm and dry. No rash noted. He is not diaphoretic.  Psychiatric: He has a normal mood and affect. His behavior is normal. Thought content normal.  Nursing note and vitals reviewed.   ED Course  Procedures   DIAGNOSTIC STUDIES: Oxygen Saturation is 100% on RA, normal by my interpretation.    COORDINATION OF CARE: 1:54 AM Discussed treatment plan which includes CXR, EKG, lab work, and Toradol with pt at bedside and pt agreed to plan.  Labs Review-  Labs Reviewed  BASIC METABOLIC PANEL - Abnormal; Notable for the following:    Glucose, Bld 111 (*)    All other components within normal limits  CBC  I-STAT TROPOININ, ED    Imaging Review Dg Chest 2 View  11/11/2014   CLINICAL DATA:  Intermittent left-sided chest pain since May. Worse tonight, with some dyspnea.  EXAM: CHEST  2 VIEW  COMPARISON:  11/06/2014  FINDINGS: The heart size and mediastinal contours are within normal limits. Both lungs are clear. The visualized skeletal structures are unremarkable.  IMPRESSION: No active cardiopulmonary disease.   Electronically Signed   By: Ellery Plunk M.D.   On: 11/11/2014 00:44    EKG Interpretation  Date/Time:  Tuesday November 11 2014 00:10:57 EDT Ventricular Rate:  72 PR Interval:  152 QRS Duration: 90 QT Interval:  392 QTC Calculation: 429 R Axis:   62 Text Interpretation:  Normal sinus rhythm with sinus arrhythmia Normal ECG When compared with ECG of 11/04/2014, HEART RATE has increased Confirmed by [facility]  MD, Kimarie Coor ([id number]) on 11/11/2014 1:25:10 AM       MDM   Final diagnoses:  Chest wall pain     Chest pain most consistent with chest wall pain. Pain is reproduced by palpation. Review of old records shows other ED visits for chest wall pain. ECG is unremarkable as is troponin.  He was given ibuprofen with good relief of pain. He is discharged with instructions to follow-up with PCP, take ibuprofen as needed for pain.   I personally performed the services described in this documentation, which was scribed in my presence. The recorded information has been reviewed and is accurate.      Dione Booze, MD 11/11/14 [phone]

## 2014-11-11 NOTE — Discharge Instructions (Signed)
Take ibuprofen as needed for pain.  Chest Wall Pain Chest wall pain is pain in or around the bones and muscles of your chest. It may take up to 6 weeks to get better. It may take longer if you must stay physically active in your work and activities.  CAUSES  Chest wall pain may happen on its own. However, it may be caused by:  A viral illness like the flu.  Injury.  Coughing.  Exercise.  Arthritis.  Fibromyalgia.  Shingles. HOME CARE INSTRUCTIONS   Avoid overtiring physical activity. Try not to strain or perform activities that cause pain. This includes any activities using your chest or your abdominal and side muscles, especially if heavy weights are used.  Put ice on the sore area.  Put ice in a plastic bag.  Place a towel between your skin and the bag.  Leave the ice on for 15-20 minutes per hour while awake for the first 2 days.  Only take over-the-counter or prescription medicines for pain, discomfort, or fever as directed by your caregiver. SEEK IMMEDIATE MEDICAL CARE IF:   Your pain increases, or you are very uncomfortable.  You have a fever.  Your chest pain becomes worse.  You have new, unexplained symptoms.  You have nausea or vomiting.  You feel sweaty or lightheaded.  You have a cough with phlegm (sputum), or you cough up blood. MAKE SURE YOU:   Understand these instructions.  Will watch your condition.  Will get help right away if you are not doing well or get worse. Document Released: 04/04/2005 Document Revised: 06/27/2011 Document Reviewed: 11/29/2010 [employer] [facility] Patient Information 2015 [place], [place]. This information is not intended to replace advice given to you by your health care provider. Make sure you discuss any questions you have with your health care provider.

## 2014-11-11 NOTE — ED Notes (Signed)
Pt c/o CP starting today while he was helping his sister put together her new crib.  Pt has been seen here multiple times in the past week.  Pt sts he was given Buspirone to take.  Says he has been taking it twice a day "as prescribed".  When asked about the Ativan prescription pt sts "I didn't start taking it because I'm not done with the other one".  This RN explained that the Ativan prescription is for prn needs when CP occurs.  Pt verbalized understanding.  Pt also c/o HA and dizziness.

## 2014-11-11 NOTE — ED Notes (Signed)
Gave pt sandwich and Sprite, per Katie - RN. 

## 2014-11-13 ENCOUNTER — Encounter: Payer: Self-pay | Admitting: Internal Medicine

## 2014-11-13 ENCOUNTER — Telehealth: Payer: Self-pay

## 2014-11-13 ENCOUNTER — Ambulatory Visit: Payer: Self-pay | Attending: Internal Medicine | Admitting: Internal Medicine

## 2014-11-13 VITALS — BP 106/71 | HR 68 | Temp 98.0°F | Resp 18 | Ht 77.0 in | Wt 301.0 lb

## 2014-11-13 DIAGNOSIS — F419 Anxiety disorder, unspecified: Secondary | ICD-10-CM | POA: Insufficient documentation

## 2014-11-13 DIAGNOSIS — R079 Chest pain, unspecified: Secondary | ICD-10-CM | POA: Insufficient documentation

## 2014-11-13 DIAGNOSIS — K219 Gastro-esophageal reflux disease without esophagitis: Secondary | ICD-10-CM | POA: Insufficient documentation

## 2014-11-13 NOTE — Patient Instructions (Signed)
I have scheduled for a chest CT.   Increase Buspar to 10 mg three times per day.  Continue follow up with [facility] of the [place]  Take the Meloxicam I ordered for you. It will help with the pain. It is a once per day medication.

## 2014-11-13 NOTE — Telephone Encounter (Signed)
Nurse called patient, patient verified date of birth. Nurse called patient to make him aware of nothing but liquids 4 hours prior to CT of chest. Patient explains he had to reschedule chest CT from August 5 to August 4.

## 2014-11-13 NOTE — Progress Notes (Signed)
Patient complains of chest pains and explains "I know its not my heart". Chest hurts off and on all day. "The only time I'm at peace is when I'm asleep and sometimes it wakes me up out of my sleep". Patient did not start medication from last visit due to side effects.   Anxiety medication is not working.   Patient currently reports mid chest pain at level 8, described as pressure.  GAD 7 score 7

## 2014-11-13 NOTE — Progress Notes (Signed)
Patient ID: Preston [name], male   DOB: Aug 30, 1980, 34 y.o.   MRN: [id number]  CC: chest pain   HPI: Preston [name] is a 34 y.o. male here today for evaluation of chest pain.  Patient has past medical history of anxiety and acid reflux. Patient has been evaluated here and in the ER several times for c/o of chest pain. Patient has had a extensive work up by cardiology at [facility] which only revealed patent foramen ovale. He has yet to see the GI provider but he does have a appointment scheduled for next month. He was last evaluated in the ER two days ago in which all test were negative including troponin and chest x-ray. He is currently taking Buspar 10 mg BID and omeprazole daily. He states that the chest pain is intermittent and he only feels at peace when he is asleep. He feels like the the Buspar is not helping his anxiety as much as the ativan. He currently rates chest pain as a 8/10 and it is described as a pressure sensation in his left sternum. The chest pain has been intermittent since May 31st. He denies SOB, palpitations, diaphoresis, or abdominal bloating. He reports that he is back feeling dizzy in the mornings upon awakening  requiring at least 30 minutes to get himself together.    Allergies  Allergen Reactions  . Red Dye Itching  . Yellow Dyes (Non-Tartrazine) Itching   Past Medical History  Diagnosis Date  . Depression   . Anxiety   . Gastroparesis   . Acid reflux    Current Outpatient Prescriptions on File Prior to Visit  Medication Sig Dispense Refill  . acetaminophen (TYLENOL) 500 MG tablet Take 500 mg by mouth every 6 (six) hours as needed for mild pain.    Marland Kitchen aspirin EC 81 MG tablet Take 81 mg by mouth every morning.    . busPIRone (BUSPAR) 10 MG tablet Take 1 tablet (10 mg total) by mouth 2 (two) times daily. 60 tablet 1  . loratadine (CLARITIN) 10 MG tablet Take 10 mg by mouth daily as needed for allergies.    Marland Kitchen omeprazole (PRILOSEC) 40 MG capsule Take 1 capsule (40 mg total)  by mouth daily. 30 capsule 5  . LORazepam (ATIVAN) 1 MG tablet Take 1 tablet (1 mg total) by mouth daily as needed for anxiety. (Patient not taking: Reported on 11/07/2014) 10 tablet 0  . meloxicam (MOBIC) 15 MG tablet Take 1 tablet (15 mg total) by mouth daily. (Patient not taking: Reported on 11/11/2014) 30 tablet 0   No current facility-administered medications on file prior to visit.   Family History  Problem Relation Age of Onset  . Cancer Father   . Diabetes Father   . Heart attack Other    History   Social History  . Marital Status: Single    Spouse Name: N/A  . Number of Children: N/A  . Years of Education: N/A   Occupational History  . Not on file.   Social History Main Topics  . Smoking status: Former Smoker -- 0.00 packs/day    Types: Cigars    Quit date: 09/06/2014  . Smokeless tobacco: Never Used  . Alcohol Use: No     Comment: social  . Drug Use: No     Comment: last use one month ago (10/07/14)  . Sexual Activity: Not on file   Other Topics Concern  . Not on file   Social History Narrative    Review of Systems:  Other than what is stated in HPI, all other systems are negative.    Objective:   Filed Vitals:   11/13/14 1433  BP: 106/71  Pulse: 68  Temp: 98 F (36.7 C)  Resp: 18    Physical Exam  Constitutional: He is oriented to person, place, and time. No distress.  Cardiovascular: Normal rate, regular rhythm and normal heart sounds.   No murmur heard. Pulmonary/Chest: Effort normal and breath sounds normal.  Musculoskeletal: He exhibits no tenderness (pain not reproducible on palpitation).  Neurological: He is alert and oriented to person, place, and time.  Skin: Skin is warm. He is not diaphoretic.  Psychiatric:  Patient actually appears calm     Lab Results  Component Value Date   WBC 7.6 11/11/2014   HGB 14.9 11/11/2014   HCT 43.0 11/11/2014   MCV 85.8 11/11/2014   PLT 282 11/11/2014   Lab Results  Component Value Date    CREATININE 1.16 11/11/2014   BUN 6 11/11/2014   NA 137 11/11/2014   K 3.7 11/11/2014   CL 101 11/11/2014   CO2 28 11/11/2014    No results found for: HGBA1C Lipid Panel  No results found for: CHOL, TRIG, HDL, CHOLHDL, VLDL, LDLCALC     Assessment and plan:   Preston [name] was seen today for chest pain.  Diagnoses and all orders for this visit:  Chest pain, unspecified chest pain type Orders: -     CT Chest Wo Contrast; Future---will schedule to give patient peace of mind and to make sure there is nothing missed on exam or previous x-rays.  I have reviewed chest x-ray, echo, ekg, and [facility] cardiology notes. I have explained to the patient several times that pain is not cardiac related. His chest pain is stable and I have given patient a GI cocktail in office. After Gi cocktail he reports some improvement in pain. Patient was consistently reassured and counseled.   Anxiety Patient's anxiety is worsening. I have increased his Buspar to 10 mg three times per day to see if he has any improvement,. He reports that he has been to [facility] for counseling but he has a f/u appointment for additional testing. Again I provided reassurance and counseling to patient.   Gastroesophageal reflux disease, esophagitis presence not specified Chest pain is likely GI in nature due to improvement in pain after administering Gi cocktail. I have advised patient to call GI office and request a earlier appointment or to be placed on cancellation list. Readdressed diet changes.    Total time spent with patient was 30 minutes. > 50% spent counseling and coordination care with patient. Case discussed with Dr. Hyman Hopes.   I do feel patient is stable for discharge. I will bring him back in the scheduled 4 weeks for medication review (Buspar). I have also advised patient to present to [facility] if he continues to feel anxious and have chest pain. Patient has been seen in ER 16 times in past 6 months for same complaint.     [name], NP-C [facility] [phone] 11/13/2014, 2:48 PM

## 2014-11-17 DIAGNOSIS — K219 Gastro-esophageal reflux disease without esophagitis: Secondary | ICD-10-CM | POA: Insufficient documentation

## 2014-11-17 DIAGNOSIS — F419 Anxiety disorder, unspecified: Secondary | ICD-10-CM | POA: Insufficient documentation

## 2014-11-17 DIAGNOSIS — R079 Chest pain, unspecified: Secondary | ICD-10-CM | POA: Insufficient documentation

## 2014-11-18 ENCOUNTER — Encounter (HOSPITAL_COMMUNITY): Payer: Self-pay | Admitting: Emergency Medicine

## 2014-11-18 ENCOUNTER — Emergency Department (HOSPITAL_COMMUNITY)
Admission: EM | Admit: 2014-11-18 | Discharge: 2014-11-18 | Disposition: A | Discharge status: Home / Self Care | Payer: Self-pay | Attending: Emergency Medicine | Admitting: Emergency Medicine

## 2014-11-18 ENCOUNTER — Emergency Department (HOSPITAL_COMMUNITY): Payer: Self-pay

## 2014-11-18 ENCOUNTER — Telehealth: Payer: Self-pay | Admitting: Internal Medicine

## 2014-11-18 DIAGNOSIS — Z79899 Other long term (current) drug therapy: Secondary | ICD-10-CM | POA: Insufficient documentation

## 2014-11-18 DIAGNOSIS — Z7982 Long term (current) use of aspirin: Secondary | ICD-10-CM | POA: Insufficient documentation

## 2014-11-18 DIAGNOSIS — F419 Anxiety disorder, unspecified: Secondary | ICD-10-CM | POA: Insufficient documentation

## 2014-11-18 DIAGNOSIS — Z87891 Personal history of nicotine dependence: Secondary | ICD-10-CM | POA: Insufficient documentation

## 2014-11-18 DIAGNOSIS — K219 Gastro-esophageal reflux disease without esophagitis: Secondary | ICD-10-CM | POA: Insufficient documentation

## 2014-11-18 DIAGNOSIS — R079 Chest pain, unspecified: Secondary | ICD-10-CM | POA: Insufficient documentation

## 2014-11-18 LAB — BASIC METABOLIC PANEL
Anion gap: 7 (ref 5–15)
BUN: 9 mg/dL (ref 6–20)
CALCIUM: 9.2 mg/dL (ref 8.9–10.3)
CO2: 24 mmol/L (ref 22–32)
Chloride: 106 mmol/L (ref 101–111)
Creatinine, Ser: 1.12 mg/dL (ref 0.61–1.24)
GFR calc non Af Amer: >60 mL/min (ref >60)
Glucose, Bld: 106 mg/dL — ABNORMAL HIGH (ref 65–99)
Potassium: 3.7 mmol/L (ref 3.5–5.1)
SODIUM: 137 mmol/L (ref 135–145)

## 2014-11-18 LAB — I-STAT TROPONIN, ED: TROPONIN I, POC: 0 ng/mL (ref 0.00–0.08)

## 2014-11-18 LAB — CBC
HCT: 41.4 % (ref 39.0–52.0)
Hemoglobin: 14.1 g/dL (ref 13.0–17.0)
MCH: 29.2 pg (ref 26.0–34.0)
MCHC: 34.1 g/dL (ref 30.0–36.0)
MCV: 85.7 fL (ref 78.0–100.0)
Platelets: 293 10*3/uL (ref 150–400)
RBC: 4.83 MIL/uL (ref 4.22–5.81)
RDW: 13.7 % (ref 11.5–15.5)
WBC: 7.3 10*3/uL (ref 4.0–10.5)

## 2014-11-18 MED ORDER — GI COCKTAIL ~~LOC~~
30.0000 mL | Freq: Once | ORAL | Status: DC
  Filled 2014-11-18: qty 30

## 2014-11-18 MED ORDER — PANTOPRAZOLE SODIUM 40 MG PO TBEC
40.0000 mg | DELAYED_RELEASE_TABLET | Freq: Once | ORAL | Status: AC
  Administered 2014-11-18: 40 mg via ORAL
  Filled 2014-11-18: qty 1

## 2014-11-18 NOTE — ED Notes (Signed)
Patient reports having chest pain in middle of chest, last meal around 2330, hx of gerd. Rates pain 8/10 and shooting in nature.

## 2014-11-18 NOTE — Telephone Encounter (Signed)
Patient called stating that he went to the ED for his chest pains, he stated that his chest is still hurting and he would like to know what he can do. He believes it's his acid reflux but is unsure. Please f/u

## 2014-11-18 NOTE — ED Provider Notes (Signed)
CSN: [id number]     Arrival date & time 11/18/14  0609 History   First MD Initiated Contact with Patient 11/18/14 [phone]     Chief Complaint  Patient presents with  . Chest Pain     (Consider location/radiation/quality/duration/timing/severity/associated sxs/prior Treatment) HPI Comments: 34 y/o M with frequent ED visits and a past medical history of chronic GERD, gastroparesis, anxiety and depression presenting with gradual onset midsternal chest pain radiating to both sides of his chest described as shooting rated 8/10 beginning around 4 AM today. States he had his last meal at 2330, went to bed about an hour later and developed the pain a few hours after that. States this feels similar to the GERD he experiences, however is "frustrated because I keep getting the pain". No specific aggravating or alleviating factors. Tried taking Tylenol with minimal relief. Denies shortness of breath, fever, chills, cough, nausea, vomiting or diaphoresis. States he saw his PCP a few days ago who ordered a CT of his chest since he continues to get the pain, and was also advised to follow-up with gastroenterology a while back which he has an appointment next month. He's had an extensive cardiac workup at [facility] and other than a patent foreman ovale, there have been no findings that are cardiac related.  Patient is a 34 y.o. male presenting with chest pain. The history is provided by the patient.  Chest Pain   Past Medical History  Diagnosis Date  . Depression   . Anxiety   . Gastroparesis   . Acid reflux    Past Surgical History  Procedure Laterality Date  . No past surgeries     Family History  Problem Relation Age of Onset  . Cancer Father   . Diabetes Father   . Heart attack Other    History  Substance Use Topics  . Smoking status: Former Smoker -- 0.00 packs/day    Types: Cigars    Quit date: 09/06/2014  . Smokeless tobacco: Never Used  . Alcohol Use: No     Comment: social    Review  of Systems  Cardiovascular: Positive for chest pain.  All other systems reviewed and are negative.     Allergies  Other; Red dye; and Yellow dyes (non-tartrazine)  Home Medications   Prior to Admission medications   Medication Sig Start Date End Date Taking? Authorizing Provider  aspirin EC 81 MG tablet Take 81 mg by mouth every morning. 09/20/14 09/20/15 Yes Historical Provider, MD  busPIRone (BUSPAR) 10 MG tablet Take 1 tablet (10 mg total) by mouth 2 (two) times daily. 11/04/14  Yes [name], NP  loratadine (CLARITIN) 10 MG tablet Take 10 mg by mouth daily as needed for allergies.   Yes Historical Provider, MD  omeprazole (PRILOSEC) 40 MG capsule Take 1 capsule (40 mg total) by mouth daily. 11/04/14  Yes [name], NP  LORazepam (ATIVAN) 1 MG tablet Take 1 tablet (1 mg total) by mouth daily as needed for anxiety. Patient not taking: Reported on 11/07/2014 11/06/14   [name], MD  meloxicam (MOBIC) 15 MG tablet Take 1 tablet (15 mg total) by mouth daily. Patient not taking: Reported on 11/11/2014 11/07/14   [name], NP   BP 118/72 mmHg  Pulse 72  Temp(Src) 98.4 F (36.9 C) (Oral)  Resp 16  Ht 6\' 5"  (1.956 m)  Wt 298 lb (135.172 kg)  BMI 35.33 kg/m2  SpO2 99% Physical Exam  Constitutional: He is oriented to  person, place, and time. He appears well-developed and well-nourished. No distress.  HENT:  Head: Normocephalic and atraumatic.  Mouth/Throat: Oropharynx is clear and moist.  Eyes: Conjunctivae and EOM are normal. Pupils are equal, round, and reactive to light.  Neck: Normal range of motion. Neck supple. No JVD present.  Cardiovascular: Normal rate, regular rhythm, normal heart sounds and intact distal pulses.   No extremity edema.  Pulmonary/Chest: Effort normal and breath sounds normal. No respiratory distress. He exhibits tenderness (L anterior).    Abdominal: Soft. Bowel sounds are normal. There is no tenderness.  Musculoskeletal: Normal range of  motion. He exhibits no edema.  Neurological: He is alert and oriented to person, place, and time. He has normal strength. No sensory deficit.  Speech fluent, goal oriented. Moves extremities without ataxia. Equal grip strength bilateral.  Skin: Skin is warm and dry. He is not diaphoretic.  Psychiatric: He has a normal mood and affect. His behavior is normal.  Nursing note and vitals reviewed.   ED Course  Procedures (including critical care time) Labs Review Labs Reviewed  BASIC METABOLIC PANEL - Abnormal; Notable for the following:    Glucose, Bld 106 (*)    All other components within normal limits  CBC  I-STAT TROPOININ, ED    Imaging Review Dg Chest 2 View  11/18/2014   CLINICAL DATA:  Chest pain, onset this morning while sleeping.  EXAM: CHEST  2 VIEW  COMPARISON:  11/11/2014  FINDINGS: The heart size and mediastinal contours are within normal limits. Both lungs are clear. The visualized skeletal structures are unremarkable.  IMPRESSION: No active cardiopulmonary disease.   Electronically Signed   By: Ellery Plunk M.D.   On: 11/18/2014 06:57     EKG Interpretation   Date/Time:  Tuesday November 18 2014 06:23:41 EDT Ventricular Rate:  55 PR Interval:  186 QRS Duration: 98 QT Interval:  424 QTC Calculation: 405 R Axis:   52 Text Interpretation:  Sinus bradycardia with sinus arrhythmia Otherwise  normal ECG Confirmed by OTTER  MD, OLGA ([id number]) on 11/18/2014 6:33:28 AM  Also confirmed by OTTER  MD, OLGA ([id number]), editor [place]  CCT, BEVERLY  ([id number])  on 11/18/2014 7:11:27 AM      MDM   Final diagnoses:  Chest pain, unspecified chest pain type   Non-toxic appearing, NAD. AFVSS. Multiple ED visits for the same over the past 6 months, negative cardiac workup at [facility] as stated above along with PCP. Has appt with GI. Doubt cardiac pain, doubt PE/dissection. CXR negative. Workup negative. Pain improved with GI cocktail and protonix. Pain also reproducible chest wall  pain. Advised f/u with PCP and GI. Advised against eating right before bed. Stable for d/c. Return precautions given. Patient states understanding of treatment care plan and is agreeable.  Kathrynn Speed, PA-C 11/18/14 7425  Marisa Severin, MD 11/18/14 [phone]

## 2014-11-18 NOTE — Discharge Instructions (Signed)
Chest Pain (Nonspecific) °It is often hard to give a specific diagnosis for the cause of chest pain. There is always a chance that your pain could be related to something serious, such as a heart attack or a blood clot in the lungs. You need to follow up with your health care provider for further evaluation. °CAUSES  °· Heartburn. °· Pneumonia or bronchitis. °· Anxiety or stress. °· Inflammation around your heart (pericarditis) or lung (pleuritis or pleurisy). °· A blood clot in the lung. °· A collapsed lung (pneumothorax). It can develop suddenly on its own (spontaneous pneumothorax) or from trauma to the chest. °· Shingles infection (herpes zoster virus). °The chest wall is composed of bones, muscles, and cartilage. Any of these can be the source of the pain. °· The bones can be bruised by injury. °· The muscles or cartilage can be strained by coughing or overwork. °· The cartilage can be affected by inflammation and become sore (costochondritis). °DIAGNOSIS  °Lab tests or other studies may be needed to find the cause of your pain. Your health care provider may have you take a test called an ambulatory electrocardiogram (ECG). An ECG records your heartbeat patterns over a 24-hour period. You may also have other tests, such as: °· Transthoracic echocardiogram (TTE). During echocardiography, sound waves are used to evaluate how blood flows through your heart. °· Transesophageal echocardiogram (TEE). °· Cardiac monitoring. This allows your health care provider to monitor your heart rate and rhythm in real time. °· Holter monitor. This is a portable device that records your heartbeat and can help diagnose heart arrhythmias. It allows your health care provider to track your heart activity for several days, if needed. °· Stress tests by exercise or by giving medicine that makes the heart beat faster. °TREATMENT  °· Treatment depends on what may be causing your chest pain. Treatment may include: °· Acid blockers for  heartburn. °· Anti-inflammatory medicine. °· Pain medicine for inflammatory conditions. °· Antibiotics if an infection is present. °· You may be advised to change lifestyle habits. This includes stopping smoking and avoiding alcohol, caffeine, and chocolate. °· You may be advised to keep your head raised (elevated) when sleeping. This reduces the chance of acid going backward from your stomach into your esophagus. °Most of the time, nonspecific chest pain will improve within 2-3 days with rest and mild pain medicine.  °HOME CARE INSTRUCTIONS  °· If antibiotics were prescribed, take them as directed. Finish them even if you start to feel better. °· For the next few days, avoid physical activities that bring on chest pain. Continue physical activities as directed. °· Do not use any tobacco products, including cigarettes, chewing tobacco, or electronic cigarettes. °· Avoid drinking alcohol. °· Only take medicine as directed by your health care provider. °· Follow your health care provider's suggestions for further testing if your chest pain does not go away. °· Keep any follow-up appointments you made. If you do not go to an appointment, you could develop lasting (chronic) problems with pain. If there is any problem keeping an appointment, call to reschedule. °SEEK MEDICAL CARE IF:  °· Your chest pain does not go away, even after treatment. °· You have a rash with blisters on your chest. °· You have a fever. °SEEK IMMEDIATE MEDICAL CARE IF:  °· You have increased chest pain or pain that spreads to your arm, neck, jaw, back, or abdomen. °· You have shortness of breath. °· You have an increasing cough, or you cough   up blood. °· You have severe back or abdominal pain. °· You feel nauseous or vomit. °· You have severe weakness. °· You faint. °· You have chills. °This is an emergency. Do not wait to see if the pain will go away. Get medical help at once. Call your local emergency services (911 in [place] Do not drive  yourself to the hospital. °MAKE SURE YOU:  °· Understand these instructions. °· Will watch your condition. °· Will get help right away if you are not doing well or get worse. °Document Released: 01/12/2005 Document Revised: 04/09/2013 Document Reviewed: 11/08/2007 °ExitCare® Patient Information ©2015 [employer]. This information is not intended to replace advice given to you by your health care provider. Make sure you discuss any questions you have with your health care provider. ° °Food Choices for Gastroesophageal Reflux Disease °When you have gastroesophageal reflux disease (GERD), the foods you eat and your eating habits are very important. Choosing the right foods can help ease the discomfort of GERD. °WHAT GENERAL GUIDELINES DO I NEED TO FOLLOW? °· Choose fruits, vegetables, whole grains, low-fat dairy products, and low-fat meat, fish, and poultry. °· Limit fats such as oils, salad dressings, butter, nuts, and avocado. °· Keep a food diary to identify foods that cause symptoms. °· Avoid foods that cause reflux. These may be different for different people. °· Eat frequent small meals instead of three large meals each day. °· Eat your meals slowly, in a relaxed setting. °· Limit fried foods. °· Cook foods using methods other than frying. °· Avoid drinking alcohol. °· Avoid drinking large amounts of liquids with your meals. °· Avoid bending over or lying down until 2-3 hours after eating. °WHAT FOODS ARE NOT RECOMMENDED? °The following are some foods and drinks that may worsen your symptoms: °Vegetables °Tomatoes. Tomato juice. Tomato and spaghetti sauce. Chili peppers. Onion and garlic. Horseradish. °Fruits °Oranges, grapefruit, and lemon (fruit and juice). °Meats °High-fat meats, fish, and poultry. This includes hot dogs, ribs, ham, sausage, salami, and bacon. °Dairy °Whole milk and chocolate milk. Sour cream. Cream. Butter. Ice cream. Cream cheese.  °Beverages °Coffee and tea, with or without caffeine.  Carbonated beverages or energy drinks. °Condiments °Hot sauce. Barbecue sauce.  °Sweets/Desserts °Chocolate and cocoa. Donuts. Peppermint and spearmint. °Fats and Oils °High-fat foods, including French fries and potato chips. °Other °Vinegar. Strong spices, such as black pepper, white pepper, red pepper, cayenne, curry powder, cloves, ginger, and chili powder. °The items listed above may not be a complete list of foods and beverages to avoid. Contact your dietitian for more information. °Document Released: 04/04/2005 Document Revised: 04/09/2013 Document Reviewed: 02/06/2013 °ExitCare® Patient Information ©2015 [employer]. This information is not intended to replace advice given to you by your health care provider. Make sure you discuss any questions you have with your health care provider. ° °Gastroesophageal Reflux Disease, Adult °Gastroesophageal reflux disease (GERD) happens when acid from your stomach flows up into the esophagus. When acid comes in contact with the esophagus, the acid causes soreness (inflammation) in the esophagus. Over time, GERD may create small holes (ulcers) in the lining of the esophagus. °CAUSES  °· Increased body weight. This puts pressure on the stomach, making acid rise from the stomach into the esophagus. °· Smoking. This increases acid production in the stomach. °· Drinking alcohol. This causes decreased pressure in the lower esophageal sphincter (valve or ring of muscle between the esophagus and stomach), allowing acid from the stomach into the esophagus. °· Late evening meals and   a full stomach. This increases pressure and acid production in the stomach. °· A malformed lower esophageal sphincter. °Sometimes, no cause is found. °SYMPTOMS  °· Burning pain in the lower part of the mid-chest behind the breastbone and in the mid-stomach area. This may occur twice a week or more often. °· Trouble swallowing. °· Sore throat. °· Dry cough. °· Asthma-like symptoms including chest  tightness, shortness of breath, or wheezing. °DIAGNOSIS  °Your caregiver may be able to diagnose GERD based on your symptoms. In some cases, X-rays and other tests may be done to check for complications or to check the condition of your stomach and esophagus. °TREATMENT  °Your caregiver may recommend over-the-counter or prescription medicines to help decrease acid production. Ask your caregiver before starting or adding any new medicines.  °HOME CARE INSTRUCTIONS  °· Change the factors that you can control. Ask your caregiver for guidance concerning weight loss, quitting smoking, and alcohol consumption. °· Avoid foods and drinks that make your symptoms worse, such as: °¨ Caffeine or alcoholic drinks. °¨ Chocolate. °¨ Peppermint or mint flavorings. °¨ Garlic and onions. °¨ Spicy foods. °¨ Citrus fruits, such as oranges, lemons, or limes. °¨ Tomato-based foods such as sauce, chili, salsa, and pizza. °¨ Fried and fatty foods. °· Avoid lying down for the 3 hours prior to your bedtime or prior to taking a nap. °· Eat small, frequent meals instead of large meals. °· Wear loose-fitting clothing. Do not wear anything tight around your waist that causes pressure on your stomach. °· Raise the head of your bed 6 to 8 inches with wood blocks to help you sleep. Extra pillows will not help. °· Only take over-the-counter or prescription medicines for pain, discomfort, or fever as directed by your caregiver. °· Do not take aspirin, ibuprofen, or other nonsteroidal anti-inflammatory drugs (NSAIDs). °SEEK IMMEDIATE MEDICAL CARE IF:  °· You have pain in your arms, neck, jaw, teeth, or back. °· Your pain increases or changes in intensity or duration. °· You develop nausea, vomiting, or sweating (diaphoresis). °· You develop shortness of breath, or you faint. °· Your vomit is green, yellow, black, or looks like coffee grounds or blood. °· Your stool is red, bloody, or black. °These symptoms could be signs of other problems, such as  heart disease, gastric bleeding, or esophageal bleeding. °MAKE SURE YOU:  °· Understand these instructions. °· Will watch your condition. °· Will get help right away if you are not doing well or get worse. °Document Released: 01/12/2005 Document Revised: 06/27/2011 Document Reviewed: 10/22/2010 °ExitCare® Patient Information ©2015 [employer]. This information is not intended to replace advice given to you by your health care provider. Make sure you discuss any questions you have with your health care provider. ° °

## 2014-11-18 NOTE — ED Notes (Signed)
ekg attempted, unable to capture on monitor in room. Obtaining portable ekg machine.

## 2014-11-18 NOTE — ED Notes (Signed)
Patient currently in xray ?

## 2014-11-20 ENCOUNTER — Ambulatory Visit (HOSPITAL_COMMUNITY): Payer: Self-pay

## 2014-11-20 ENCOUNTER — Encounter (HOSPITAL_COMMUNITY): Payer: Self-pay

## 2014-11-20 ENCOUNTER — Ambulatory Visit (HOSPITAL_COMMUNITY)
Admission: RE | Admit: 2014-11-20 | Discharge: 2014-11-20 | Disposition: A | Discharge status: Home / Self Care | Payer: Self-pay | Source: Ambulatory Visit | Attending: Internal Medicine | Admitting: Internal Medicine

## 2014-11-20 DIAGNOSIS — Z87891 Personal history of nicotine dependence: Secondary | ICD-10-CM | POA: Insufficient documentation

## 2014-11-20 DIAGNOSIS — R079 Chest pain, unspecified: Secondary | ICD-10-CM | POA: Insufficient documentation

## 2014-11-20 DIAGNOSIS — R0602 Shortness of breath: Secondary | ICD-10-CM | POA: Insufficient documentation

## 2014-11-20 DIAGNOSIS — R918 Other nonspecific abnormal finding of lung field: Secondary | ICD-10-CM | POA: Insufficient documentation

## 2014-11-21 ENCOUNTER — Ambulatory Visit (HOSPITAL_COMMUNITY): Payer: Self-pay

## 2014-11-24 ENCOUNTER — Telehealth: Payer: Self-pay | Admitting: Internal Medicine

## 2014-11-24 NOTE — Telephone Encounter (Signed)
-----   Message from Tandy Gaw, RN sent at 11/24/2014 12:39 PM EDT -----   ----- Message -----    From: [name], NP    Sent: 11/21/2014   2:29 PM      To: Tandy Gaw, RN  Small lung nodule found. It is not suggestive of cancer and should not be causing pain. We will follow up with a chest CT yearly just to make sure the nodule is not growing. There is nothing else that needs to be done about this and it should not cause worry for him. Most of the time they disappear on their own. Otherwise CT was negative

## 2014-11-24 NOTE — Telephone Encounter (Signed)
Patient called to request his CT scan results from [facility]. Please call pt back at [phone].

## 2014-11-24 NOTE — Telephone Encounter (Signed)
Returned patient call. Patient verified name and date of birth. Patient notified that a small lung nodule was found on his CT, but it is not suggestive of cancer and should not be causing pain. Patient notified that we will follow up with a chest CT yearly to make sure the nodule is not growing. Patient informed there is nothing else that needs to be done about this and it should not cause worry for him. Patient notified that most of the time they disappear on their own. Patient notified otherwise, CT was negative. Patient voiced understanding.

## 2014-11-25 ENCOUNTER — Telehealth: Payer: Self-pay | Admitting: Internal Medicine

## 2014-11-25 NOTE — Telephone Encounter (Signed)
Patient was prescribed Ranitidine and xanax from another provider and he is having headaches in the morning that last between 30 minutes to an hour. Pt would like to be seen sooner than the 30th but mentioned that if a nurse can advise him something to cope with headaches he should be able to wait. Please follow up with pt. Thank you.

## 2014-11-26 ENCOUNTER — Telehealth: Payer: Self-pay | Admitting: Internal Medicine

## 2014-11-26 NOTE — Telephone Encounter (Signed)
Pt calling to see if he can take a detox cleanser while on his medication. Please assist.

## 2014-12-01 ENCOUNTER — Other Ambulatory Visit: Payer: Self-pay | Admitting: Internal Medicine

## 2014-12-01 ENCOUNTER — Telehealth: Payer: Self-pay | Admitting: Internal Medicine

## 2014-12-01 NOTE — Telephone Encounter (Signed)
Pt is wondering if he is able to take a laxative in a form of a pill to cleanse his colon along with the current medication that he is taking. Patient is taking anxiety medications as well as Acid Reflux. Please follow up with pt. Thank you.

## 2014-12-02 ENCOUNTER — Encounter (HOSPITAL_COMMUNITY): Payer: Self-pay | Admitting: *Deleted

## 2014-12-02 ENCOUNTER — Emergency Department (HOSPITAL_COMMUNITY): Payer: Self-pay

## 2014-12-02 ENCOUNTER — Emergency Department (HOSPITAL_COMMUNITY)
Admission: EM | Admit: 2014-12-02 | Discharge: 2014-12-02 | Disposition: A | Discharge status: Home / Self Care | Payer: Self-pay | Attending: Emergency Medicine | Admitting: Emergency Medicine

## 2014-12-02 DIAGNOSIS — Z87891 Personal history of nicotine dependence: Secondary | ICD-10-CM | POA: Insufficient documentation

## 2014-12-02 DIAGNOSIS — K2100 Gastro-esophageal reflux disease with esophagitis, without bleeding: Secondary | ICD-10-CM

## 2014-12-02 DIAGNOSIS — K219 Gastro-esophageal reflux disease without esophagitis: Secondary | ICD-10-CM | POA: Insufficient documentation

## 2014-12-02 DIAGNOSIS — F329 Major depressive disorder, single episode, unspecified: Secondary | ICD-10-CM | POA: Insufficient documentation

## 2014-12-02 DIAGNOSIS — Z7982 Long term (current) use of aspirin: Secondary | ICD-10-CM | POA: Insufficient documentation

## 2014-12-02 DIAGNOSIS — K5900 Constipation, unspecified: Secondary | ICD-10-CM | POA: Insufficient documentation

## 2014-12-02 DIAGNOSIS — Z79899 Other long term (current) drug therapy: Secondary | ICD-10-CM | POA: Insufficient documentation

## 2014-12-02 DIAGNOSIS — F419 Anxiety disorder, unspecified: Secondary | ICD-10-CM | POA: Insufficient documentation

## 2014-12-02 DIAGNOSIS — R079 Chest pain, unspecified: Secondary | ICD-10-CM | POA: Insufficient documentation

## 2014-12-02 LAB — CBC WITH DIFFERENTIAL/PLATELET
Basophils Absolute: 0 10*3/uL (ref 0.0–0.1)
Basophils Relative: 0 % (ref 0–1)
Eosinophils Absolute: 0.4 10*3/uL (ref 0.0–0.7)
Eosinophils Relative: 3 % (ref 0–5)
HEMATOCRIT: 43.5 % (ref 39.0–52.0)
Hemoglobin: 14.6 g/dL (ref 13.0–17.0)
Lymphocytes Relative: 24 % (ref 12–46)
Lymphs Abs: 2.8 10*3/uL (ref 0.7–4.0)
MCH: 29.3 pg (ref 26.0–34.0)
MCHC: 33.6 g/dL (ref 30.0–36.0)
MCV: 87.2 fL (ref 78.0–100.0)
MONO ABS: 0.9 10*3/uL (ref 0.1–1.0)
Monocytes Relative: 8 % (ref 3–12)
Neutro Abs: 7.7 10*3/uL (ref 1.7–7.7)
Neutrophils Relative %: 65 % (ref 43–77)
Platelets: 309 10*3/uL (ref 150–400)
RBC: 4.99 MIL/uL (ref 4.22–5.81)
RDW: 13.6 % (ref 11.5–15.5)
WBC: 11.8 10*3/uL — ABNORMAL HIGH (ref 4.0–10.5)

## 2014-12-02 LAB — BASIC METABOLIC PANEL
Anion gap: 8 (ref 5–15)
BUN: 9 mg/dL (ref 6–20)
CHLORIDE: 105 mmol/L (ref 101–111)
CO2: 26 mmol/L (ref 22–32)
Calcium: 9.4 mg/dL (ref 8.9–10.3)
Creatinine, Ser: 1.25 mg/dL — ABNORMAL HIGH (ref 0.61–1.24)
GFR calc Af Amer: >60 mL/min (ref >60)
GFR calc non Af Amer: >60 mL/min (ref >60)
GLUCOSE: 106 mg/dL — AB (ref 65–99)
POTASSIUM: 3.8 mmol/L (ref 3.5–5.1)
Sodium: 139 mmol/L (ref 135–145)

## 2014-12-02 LAB — I-STAT TROPONIN, ED: Troponin i, poc: 0 ng/mL (ref 0.00–0.08)

## 2014-12-02 MED ORDER — ALUM & MAG HYDROXIDE-SIMETH 200-200-20 MG/5ML PO SUSP
30.0000 mL | Freq: Once | ORAL | Status: AC
  Administered 2014-12-02: 30 mL via ORAL
  Filled 2014-12-02: qty 30

## 2014-12-02 MED ORDER — GI COCKTAIL ~~LOC~~: 30.0000 mL | Freq: Once | ORAL | Status: DC

## 2014-12-02 MED ORDER — SUCRALFATE 1 G PO TABS: 1.0000 g | ORAL_TABLET | Freq: Four times a day (QID) | ORAL | Status: DC

## 2014-12-02 NOTE — ED Notes (Signed)
Patient states he was watching TV and started with centralized CP

## 2014-12-02 NOTE — ED Notes (Signed)
Pt going to xray  

## 2014-12-02 NOTE — ED Notes (Signed)
Dr. Palumbo at the bedside.  

## 2014-12-02 NOTE — Discharge Instructions (Signed)

## 2014-12-02 NOTE — ED Provider Notes (Signed)
CSN: [id number]     Arrival date & time 12/02/14  0055 History  This chart was scribed for Lahna Nath, MD by Doreatha Martin, ED Scribe. This patient was seen in room D35C/D35C and the patient's care was started at 1:48 AM.     Chief Complaint  Patient presents with  . Chest Pain   Patient is a 34 y.o. male presenting with chest pain. The history is provided by the patient. No language interpreter was used.  Chest Pain Pain location:  Epigastric Pain quality: aching   Pain radiates to:  Does not radiate Pain radiates to the back: no   Pain severity:  Moderate Duration:  2 hours Timing:  Constant Progression:  Unchanged Chronicity:  Recurrent Context: at rest   Relieved by:  None tried Worsened by:  Nothing tried Ineffective treatments:  None tried Associated symptoms: no fever, no palpitations and no shortness of breath   Risk factors: male sex    HPI Comments: STEPHON WEATHERS is a 34 y.o. male with Hx of acid reflux who presents to the Emergency Department complaining of moderate, aching, epigastric CP while at rest onset 2 hours PTA. He states his associated constipation (last BM 4 days ago). He states [place] [employer] [place] 1800. He states that he is not yet followed by GI and has not had an endoscopy, but is scheduled with GI for 8/29. Pt is taking Prilosec. He denies medication mis-dosages.   Past Medical History  Diagnosis Date  . Depression   . Anxiety   . Gastroparesis   . Acid reflux    Past Surgical History  Procedure Laterality Date  . No past surgeries     Family History  Problem Relation Age of Onset  . Cancer Father   . Diabetes Father   . Heart attack Other    Social History  Substance Use Topics  . Smoking status: Former Smoker -- 0.00 packs/day    Types: Cigars    Quit date: 09/06/2014  . Smokeless tobacco: Never Used  . Alcohol Use: No     Comment: social    Review of Systems  Constitutional: Negative for fever.  Respiratory: Negative  for shortness of breath.   Cardiovascular: Positive for chest pain. Negative for palpitations and leg swelling.  Gastrointestinal: Positive for constipation.  All other systems reviewed and are negative.   Allergies  Other; Red dye; and Yellow dyes (non-tartrazine)  Home Medications   Prior to Admission medications   Medication Sig Start Date End Date Taking? Authorizing Provider  aspirin EC 81 MG tablet Take 81 mg by mouth every morning. 09/20/14 09/20/15 Yes Historical Provider, MD  busPIRone (BUSPAR) 10 MG tablet Take 1 tablet (10 mg total) by mouth 2 (two) times daily. 11/04/14  Yes [name], NP  loratadine (CLARITIN) 10 MG tablet Take 10 mg by mouth daily as needed for allergies.   Yes Historical Provider, MD  omeprazole (PRILOSEC) 40 MG capsule Take 1 capsule (40 mg total) by mouth daily. 11/04/14  Yes [name], NP  ranitidine (ZANTAC) 150 MG tablet Take 150 mg by mouth at bedtime.   Yes Historical Provider, MD  LORazepam (ATIVAN) 1 MG tablet Take 1 tablet (1 mg total) by mouth daily as needed for anxiety. Patient not taking: Reported on 11/07/2014 11/06/14   [name], MD  meloxicam (MOBIC) 15 MG tablet Take 1 tablet (15 mg total) by mouth daily. Patient not taking: Reported on 11/11/2014 11/07/14  [name], NP   BP 128/79 mmHg  Pulse 77  Temp(Src) 98 F (36.7 C) (Oral)  Resp 20  Ht 6\' 5"  (1.956 m)  Wt 298 lb 1.6 oz (135.217 kg)  BMI 35.34 kg/m2  SpO2 100% Physical Exam  Constitutional: He is oriented to person, place, and time. He appears well-developed and well-nourished.  HENT:  Head: Normocephalic and atraumatic.  Mouth/Throat: Oropharynx is clear and moist. No oropharyngeal exudate.  Eyes: Conjunctivae and EOM are normal. Pupils are equal, round, and reactive to light.  Neck: Normal range of motion. Neck supple.  Cardiovascular: Normal rate, regular rhythm and normal heart sounds.   Pulmonary/Chest: Effort normal and breath sounds normal. No  respiratory distress. He has no wheezes. He has no rales.  Lungs CTA  Abdominal: Soft. He exhibits no distension. There is no tenderness. There is no rebound and no guarding.  Hyperactive bowel sounds into thoracic cavity. Moderate constipation.   Musculoskeletal: Normal range of motion. He exhibits no edema or tenderness.  Neurological: He is alert and oriented to person, place, and time.  Skin: Skin is warm and dry.  Psychiatric: He has a normal mood and affect. His behavior is normal.  Nursing note and vitals reviewed.   ED Course  Procedures (including critical care time) DIAGNOSTIC STUDIES: Oxygen Saturation is 100% on RA, normal by my interpretation.    COORDINATION OF CARE: 1:53 AM Discussed treatment plan with pt at bedside and pt agreed to plan.   Labs Review Labs Reviewed  CBC WITH DIFFERENTIAL/PLATELET - Abnormal; Notable for the following:    WBC 11.8 (*)    All other components within normal limits  BASIC METABOLIC PANEL - Abnormal; Notable for the following:    Glucose, Bld 106 (*)    Creatinine, Ser 1.25 (*)    All other components within normal limits  Rosezena Sensor, ED    Imaging Review Dg Chest 2 View  12/02/2014   CLINICAL DATA:  Acute onset of centralized chest pain and shortness of breath. Initial encounter.  EXAM: CHEST  2 VIEW  COMPARISON:  Chest radiograph performed 11/18/2014, and CT of the chest performed 11/20/2014  FINDINGS: The lungs are well-aerated and clear. There is no evidence of focal opacification, pleural effusion or pneumothorax. The known 4 mm left upper lobe pulmonary nodule is not well assessed on radiograph.  The heart is normal in size; the mediastinal contour is within normal limits. No acute osseous abnormalities are seen.  IMPRESSION: No acute cardiopulmonary process seen.   Electronically Signed   By: Roanna Raider M.D.   On: 12/02/2014 01:35   I, Juandiego Kolenovic, MD, personally reviewed and evaluated these images and lab results as  part of my medical decision-making.   EKG Interpretation   Date/Time:  Tuesday December 02 2014 00:57:45 EDT Ventricular Rate:  66 PR Interval:  166 QRS Duration: 94 QT Interval:  400 QTC Calculation: 419 R Axis:   60 Text Interpretation:  Normal sinus rhythm Confirmed by [facility]  MD,  Kacy Conely ([id number]) on 12/02/2014 1:33:03 AM      MDM   Final diagnoses:  None   Results for orders placed or performed during the hospital encounter of 12/02/14  CBC with Differential  Result Value Ref Range   WBC 11.8 (H) 4.0 - 10.5 K/uL   RBC 4.99 4.22 - 5.81 MIL/uL   Hemoglobin 14.6 13.0 - 17.0 g/dL   HCT 60.4 54.0 - 98.1 %   MCV 87.2 78.0 - 100.0 fL  MCH 29.3 26.0 - 34.0 pg   MCHC 33.6 30.0 - 36.0 g/dL   RDW 21.3 08.6 - 57.8 %   Platelets 309 150 - 400 K/uL   Neutrophils Relative % 65 43 - 77 %   Neutro Abs 7.7 1.7 - 7.7 K/uL   Lymphocytes Relative 24 12 - 46 %   Lymphs Abs 2.8 0.7 - 4.0 K/uL   Monocytes Relative 8 3 - 12 %   Monocytes Absolute 0.9 0.1 - 1.0 K/uL   Eosinophils Relative 3 0 - 5 %   Eosinophils Absolute 0.4 0.0 - 0.7 K/uL   Basophils Relative 0 0 - 1 %   Basophils Absolute 0.0 0.0 - 0.1 K/uL  Basic metabolic panel  Result Value Ref Range   Sodium 139 135 - 145 mmol/L   Potassium 3.8 3.5 - 5.1 mmol/L   Chloride 105 101 - 111 mmol/L   CO2 26 22 - 32 mmol/L   Glucose, Bld 106 (H) 65 - 99 mg/dL   BUN 9 6 - 20 mg/dL   Creatinine, Ser 4.69 (H) 0.61 - 1.24 mg/dL   Calcium 9.4 8.9 - 62.9 mg/dL   GFR calc non Af Amer >60 >60 mL/min   GFR calc Af Amer >60 >60 mL/min   Anion gap 8 5 - 15  I-stat troponin, ED  Result Value Ref Range   Troponin i, poc 0.00 0.00 - 0.08 ng/mL   Comment 3           Dg Chest 2 View  12/02/2014   CLINICAL DATA:  Acute onset of centralized chest pain and shortness of breath. Initial encounter.  EXAM: CHEST  2 VIEW  COMPARISON:  Chest radiograph performed 11/18/2014, and CT of the chest performed 11/20/2014  FINDINGS: The lungs are  well-aerated and clear. There is no evidence of focal opacification, pleural effusion or pneumothorax. The known 4 mm left upper lobe pulmonary nodule is not well assessed on radiograph.  The heart is normal in size; the mediastinal contour is within normal limits. No acute osseous abnormalities are seen.  IMPRESSION: No acute cardiopulmonary process seen.   Electronically Signed   By: Roanna Raider M.D.   On: 12/02/2014 01:35   Dg Chest 2 View  11/18/2014   CLINICAL DATA:  Chest pain, onset this morning while sleeping.  EXAM: CHEST  2 VIEW  COMPARISON:  11/11/2014  FINDINGS: The heart size and mediastinal contours are within normal limits. Both lungs are clear. The visualized skeletal structures are unremarkable.  IMPRESSION: No active cardiopulmonary disease.   Electronically Signed   By: Ellery Plunk M.D.   On: 11/18/2014 06:57   Dg Chest 2 View  11/11/2014   CLINICAL DATA:  Intermittent left-sided chest pain since May. Worse tonight, with some dyspnea.  EXAM: CHEST  2 VIEW  COMPARISON:  11/06/2014  FINDINGS: The heart size and mediastinal contours are within normal limits. Both lungs are clear. The visualized skeletal structures are unremarkable.  IMPRESSION: No active cardiopulmonary disease.   Electronically Signed   By: Ellery Plunk M.D.   On: 11/11/2014 00:44   Dg Chest 2 View  11/06/2014   CLINICAL DATA:  Initial evaluation for acute chest pain.  EXAM: CHEST  2 VIEW  COMPARISON:  Prior radiograph from 11/04/2014  FINDINGS: The cardiac and mediastinal silhouettes are stable in size and contour, and remain within normal limits.  The lungs are normally inflated. No airspace consolidation, pleural effusion, or pulmonary edema is identified. There is no  pneumothorax.  No acute osseous abnormality identified.  IMPRESSION: No active cardiopulmonary disease.   Electronically Signed   By: Rise Mu M.D.   On: 11/06/2014 05:29   Dg Chest 2 View  11/04/2014   CLINICAL DATA:   34 year old male with chest pain  EXAM: CHEST  2 VIEW  COMPARISON:  10/27/2014  FINDINGS: The heart size and mediastinal contours are within normal limits. Both lungs are clear. The visualized skeletal structures are unremarkable.  IMPRESSION: No active cardiopulmonary disease.   Electronically Signed   By: Elgie Collard M.D.   On: 11/04/2014 02:23   Ct Chest Wo Contrast  11/20/2014   CLINICAL DATA:  34 year old male former smoker with persistent central chest pain and shortness of breath. Quit smoking in May 2016.  EXAM: CT CHEST WITHOUT CONTRAST  TECHNIQUE: Multidetector CT imaging of the chest was performed following the standard protocol without IV contrast.  COMPARISON:  11/18/2014 chest radiograph.  No prior chest CT.  FINDINGS: Mediastinum/Nodes: Normal heart size. No pericardial fluid/thickening. Great vessels are normal in course and caliber. Normal visualized thyroid. Normal esophagus. There is mild remnant thymic tissue in the anterior mediastinum with internal fatty atrophy. No axillary, mediastinal or gross hilar lymphadenopathy, noting limited sensitivity for the detection of hilar adenopathy on this noncontrast study.  Lungs/Pleura: No pneumothorax. No pleural effusion. Left upper lobe 4 mm pulmonary nodule (series 5/image 18). Otherwise clear lungs, with no areas of ground-glass attenuation or focal lung consolidation. No bronchiectasis.  Upper abdomen: Unremarkable.  Musculoskeletal: Minimal degenerative changes in the lower thoracic spine. No suspicious focal osseous lesions.  IMPRESSION: 1. No acute pulmonary disease. 2. Left upper lobe 4 mm pulmonary nodule. Given risk factors for bronchogenic carcinoma, follow-up chest CT at 1 year is recommended. This recommendation follows the consensus statement: Guidelines for Management of Small Pulmonary Nodules Detected on CT Scans: A Statement from the Fleischner Society as published in Radiology 2005; 237:395-400.   Electronically Signed   By:  Delbert Phenix M.D.   On: 11/20/2014 17:04      PERC negative wells 0 highly doubt PE, symptoms consistent with known GERD.  Will add carafate to patient's known regimen  I personally performed the services described in this documentation, which was scribed in my presence. The recorded information has been reviewed and is accurate.    Cy Blamer, MD 12/02/14 [id number]

## 2014-12-02 NOTE — ED Notes (Signed)
MD at bedside. 

## 2014-12-04 ENCOUNTER — Emergency Department (HOSPITAL_COMMUNITY)
Admission: EM | Admit: 2014-12-04 | Discharge: 2014-12-04 | Disposition: A | Discharge status: Home / Self Care | Payer: Self-pay | Attending: Emergency Medicine | Admitting: Emergency Medicine

## 2014-12-04 ENCOUNTER — Emergency Department (HOSPITAL_COMMUNITY): Payer: Self-pay

## 2014-12-04 ENCOUNTER — Encounter (HOSPITAL_COMMUNITY): Payer: Self-pay | Admitting: Emergency Medicine

## 2014-12-04 DIAGNOSIS — M545 Low back pain: Secondary | ICD-10-CM | POA: Insufficient documentation

## 2014-12-04 DIAGNOSIS — F329 Major depressive disorder, single episode, unspecified: Secondary | ICD-10-CM | POA: Insufficient documentation

## 2014-12-04 DIAGNOSIS — Z87891 Personal history of nicotine dependence: Secondary | ICD-10-CM | POA: Insufficient documentation

## 2014-12-04 DIAGNOSIS — R079 Chest pain, unspecified: Secondary | ICD-10-CM

## 2014-12-04 DIAGNOSIS — G8929 Other chronic pain: Secondary | ICD-10-CM | POA: Insufficient documentation

## 2014-12-04 DIAGNOSIS — Z79899 Other long term (current) drug therapy: Secondary | ICD-10-CM | POA: Insufficient documentation

## 2014-12-04 DIAGNOSIS — K219 Gastro-esophageal reflux disease without esophagitis: Secondary | ICD-10-CM | POA: Insufficient documentation

## 2014-12-04 DIAGNOSIS — Z7982 Long term (current) use of aspirin: Secondary | ICD-10-CM | POA: Insufficient documentation

## 2014-12-04 DIAGNOSIS — R0789 Other chest pain: Secondary | ICD-10-CM | POA: Insufficient documentation

## 2014-12-04 DIAGNOSIS — F419 Anxiety disorder, unspecified: Secondary | ICD-10-CM | POA: Insufficient documentation

## 2014-12-04 LAB — BASIC METABOLIC PANEL
Anion gap: 8 (ref 5–15)
BUN: 8 mg/dL (ref 6–20)
CALCIUM: 9.8 mg/dL (ref 8.9–10.3)
CHLORIDE: 104 mmol/L (ref 101–111)
CO2: 28 mmol/L (ref 22–32)
CREATININE: 1.28 mg/dL — AB (ref 0.61–1.24)
GFR calc Af Amer: >60 mL/min (ref >60)
GFR calc non Af Amer: >60 mL/min (ref >60)
Glucose, Bld: 104 mg/dL — ABNORMAL HIGH (ref 65–99)
Potassium: 4.1 mmol/L (ref 3.5–5.1)
SODIUM: 140 mmol/L (ref 135–145)

## 2014-12-04 LAB — CBC
HCT: 42.2 % (ref 39.0–52.0)
Hemoglobin: 14.4 g/dL (ref 13.0–17.0)
MCH: 29.4 pg (ref 26.0–34.0)
MCHC: 34.1 g/dL (ref 30.0–36.0)
MCV: 86.3 fL (ref 78.0–100.0)
PLATELETS: 314 10*3/uL (ref 150–400)
RBC: 4.89 MIL/uL (ref 4.22–5.81)
RDW: 13.4 % (ref 11.5–15.5)
WBC: 7.6 10*3/uL (ref 4.0–10.5)

## 2014-12-04 LAB — I-STAT TROPONIN, ED: TROPONIN I, POC: 0 ng/mL (ref 0.00–0.08)

## 2014-12-04 MED ORDER — ACETAMINOPHEN 500 MG PO TABS
1000.0000 mg | ORAL_TABLET | Freq: Once | ORAL | Status: AC
  Administered 2014-12-04: 1000 mg via ORAL
  Filled 2014-12-04: qty 2

## 2014-12-04 NOTE — ED Notes (Signed)
C/o pressure across chest that radiates into throat, lower back pain, sob, and dizziness.  Denies nausea and vomiting.  Seen in ED on 8/16 for chest pain.

## 2014-12-04 NOTE — Discharge Instructions (Signed)
Chest Wall Pain Chest wall pain is pain in or around the bones and muscles of your chest. It may take up to 6 weeks to get better. It may take longer if you must stay physically active in your work and activities.  CAUSES  Chest wall pain may happen on its own. However, it may be caused by:  A viral illness like the flu.  Injury.  Coughing.  Exercise.  Arthritis.  Fibromyalgia.  Shingles. HOME CARE INSTRUCTIONS   Avoid overtiring physical activity. Try not to strain or perform activities that cause pain. This includes any activities using your chest or your abdominal and side muscles, especially if heavy weights are used.  Put ice on the sore area.  Put ice in a plastic bag.  Place a towel between your skin and the bag.  Leave the ice on for 15-20 minutes per hour while awake for the first 2 days.  Only take over-the-counter or prescription medicines for pain, discomfort, or fever as directed by your caregiver. SEEK IMMEDIATE MEDICAL CARE IF:   Your pain increases, or you are very uncomfortable.  You have a fever.  Your chest pain becomes worse.  You have new, unexplained symptoms.  You have nausea or vomiting.  You feel sweaty or lightheaded.  You have a cough with phlegm (sputum), or you cough up blood. MAKE SURE YOU:   Understand these instructions.  Will watch your condition.  Will get help right away if you are not doing well or get worse. Document Released: 04/04/2005 Document Revised: 06/27/2011 Document Reviewed: 11/29/2010 [facility] Patient Information 2015 [place], [place]. This information is not intended to replace advice given to you by your health care provider. Make sure you discuss any questions you have with your health care provider.  Chronic Pain Chronic pain can be defined as pain that is off and on and lasts for 3-6 months or longer. Many things cause chronic pain, which can make it difficult to make a diagnosis. There are many treatment  options available for chronic pain. However, finding a treatment that works well for you may require trying various approaches until the right one is found. Many people benefit from a combination of two or more types of treatment to control their pain. SYMPTOMS  Chronic pain can occur anywhere in the body and can range from mild to very severe. Some types of chronic pain include:  Headache.  Low back pain.  Cancer pain.  Arthritis pain.  Neurogenic pain. This is pain resulting from damage to nerves. People with chronic pain may also have other symptoms such as:  Depression.  Anger.  Insomnia.  Anxiety. DIAGNOSIS  Your health care provider will help diagnose your condition over time. In many cases, the initial focus will be on excluding possible conditions that could be causing the pain. Depending on your symptoms, your health care provider may order tests to diagnose your condition. Some of these tests may include:   Blood tests.   CT scan.   MRI.   X-rays.   Ultrasounds.   Nerve conduction studies.  You may need to see a specialist.  TREATMENT  Finding treatment that works well may take time. You may be referred to a pain specialist. He or she may prescribe medicine or therapies, such as:   Mindful meditation or yoga.  Shots (injections) of numbing or pain-relieving medicines into the spine or area of pain.  Local electrical stimulation.  Acupuncture.   Massage therapy.   Aroma, color, light,  or sound therapy.   Biofeedback.   Working with a physical therapist to keep from getting stiff.   Regular, gentle exercise.   Cognitive or behavioral therapy.   Group support.  Sometimes, surgery may be recommended.  HOME CARE INSTRUCTIONS   Take all medicines as directed by your health care provider.   Lessen stress in your life by relaxing and doing things such as listening to calming music.   Exercise or be active as directed by your  health care provider.   Eat a healthy diet and include things such as vegetables, fruits, fish, and lean meats in your diet.   Keep all follow-up appointments with your health care provider.   Attend a support group with others suffering from chronic pain. SEEK MEDICAL CARE IF:   Your pain gets worse.   You develop a new pain that was not there before.   You cannot tolerate medicines given to you by your health care provider.   You have new symptoms since your last visit with your health care provider.  SEEK IMMEDIATE MEDICAL CARE IF:   You feel weak.   You have decreased sensation or numbness.   You lose control of bowel or bladder function.   Your pain suddenly gets much worse.   You develop shaking.  You develop chills.  You develop confusion.  You develop chest pain.  You develop shortness of breath.  MAKE SURE YOU:  Understand these instructions.  Will watch your condition.  Will get help right away if you are not doing well or get worse. Document Released: 12/25/2001 Document Revised: 12/05/2012 Document Reviewed: 09/28/2012 [facility] Patient Information 2015 [place], [place]. This information is not intended to replace advice given to you by your health care provider. Make sure you discuss any questions you have with your health care provider.

## 2014-12-04 NOTE — ED Provider Notes (Signed)
This chart was scribed for Preston Maw Ward, DO by [name], ED Scribe. This patient was seen in room A04C/A04C and patient care was started at 2:44 AM.   TIME SEEN: 2:44 AM  CHIEF COMPLAINT: chest pain and back pain  HPI:  Preston [name] is a 34 y.o. male with hx of frequent ED visits for chest pain, gastroparesis and acid reflux who presents to the Emergency Department complaining of intermittent chest pain and lower back pain onset 3 months ago. Reports intermittent sore throat onset one month ago and tingling on the right side. Denies worsening or alleviating factors for his symptoms. Pt states that he has established a PCP. Pt denies fever, chills, nausea, bladder or bowel incontinence, dysuria, or vomiting.   Of note, patient has been here 19 times in the past 6 months for similar symptoms. Has had multiple negative troponins, chest x-ray and a negative d-dimer most recently in June 2016.  PCP: [name], NP  ROS: See HPI Constitutional: no fever  Eyes: no drainage  ENT: no runny nose   Cardiovascular:  chest pain  Resp: no SOB  GI: no vomiting GU: no dysuria Integumentary: no rash  Allergy: no hives  Musculoskeletal: no leg swelling  Neurological: no slurred speech ROS otherwise negative  PAST MEDICAL HISTORY/PAST SURGICAL HISTORY:  Past Medical History  Diagnosis Date  . Depression   . Anxiety   . Gastroparesis   . Acid reflux     MEDICATIONS:  Prior to Admission medications   Medication Sig Start Date End Date Taking? Authorizing Provider  aspirin EC 81 MG tablet Take 81 mg by mouth every morning. 09/20/14 09/20/15 Yes Historical Provider, MD  busPIRone (BUSPAR) 10 MG tablet Take 1 tablet (10 mg total) by mouth 2 (two) times daily. 11/04/14  Yes [name], NP  loratadine (CLARITIN) 10 MG tablet Take 10 mg by mouth daily as needed for allergies.   Yes Historical Provider, MD  omeprazole (PRILOSEC) 40 MG capsule Take 1 capsule (40 mg total) by mouth daily.  11/04/14  Yes [name], NP  ranitidine (ZANTAC) 150 MG tablet Take 150 mg by mouth at bedtime.   Yes Historical Provider, MD  sucralfate (CARAFATE) 1 G tablet Take 1 tablet (1 g total) by mouth 4 (four) times daily. 12/02/14  Yes [name], MD  LORazepam (ATIVAN) 1 MG tablet Take 1 tablet (1 mg total) by mouth daily as needed for anxiety. Patient not taking: Reported on 11/07/2014 11/06/14   [name], MD  meloxicam (MOBIC) 15 MG tablet Take 1 tablet (15 mg total) by mouth daily. Patient not taking: Reported on 11/11/2014 11/07/14   [name], NP    ALLERGIES:  Allergies  Allergen Reactions  . Other     Gi cocktail "it makes me feel like my throat is closing up"  . Red Dye Itching  . Yellow Dyes (Non-Tartrazine) Itching    SOCIAL HISTORY:  Social History  Substance Use Topics  . Smoking status: Former Smoker -- 0.00 packs/day    Types: Cigars    Quit date: 09/06/2014  . Smokeless tobacco: Never Used  . Alcohol Use: Yes     Comment: social    FAMILY HISTORY: Family History  Problem Relation Age of Onset  . Cancer Father   . Diabetes Father   . Heart attack Other     EXAM: BP 125/77 mmHg  Pulse 66  Temp(Src) 97.7 F (36.5 C) (Oral)  Resp 17  SpO2 97%   CONSTITUTIONAL: Alert and oriented and responds appropriately to questions. Well-appearing; well-nourished HEAD: Normocephalic EYES: Conjunctivae clear, PERRL ENT: normal nose; no rhinorrhea; moist mucous membranes; pharynx without lesions noted, no tonsillar hypertrophy or exudate, no uvular deviation, no trismus or drooling, no sign of dental abscess, normal phonation, no stridor, no Ludwig angina NECK: Supple, no meningismus, no LAD, no midline spinal tenderness or step-off or deformity, trachea midline, no subcutaneous air  CARD: RRR; S1 and S2 appreciated; no murmurs, no clicks, no rubs, no gallops RESP: Normal chest excursion without splinting or tachypnea; breath sounds clear and equal bilaterally;  no wheezes, no rhonchi, no rales, no hypoxia or respiratory distress, speaking full sentences CHEST: Tender to palpation over left chest wall. No crepitus, no ecchymosis, no bony deformity ABD/GI: Normal bowel sounds; non-distended; soft, non-tender, no rebound, no guarding, no peritoneal signs BACK:  The back appears normal and is non-tender to palpation, there is no CVA tenderness, no midline spinal tenderness or step-off or deformity EXT: Normal ROM in all joints; non-tender to palpation; no edema; normal capillary refill; no cyanosis, no calf tenderness or swelling    SKIN: Normal color for age and race; warm NEURO: Moves all extremities equally, sensation to light touch intact diffusely, cranial nerves II through XII intact, no saddle anesthesia PSYCH: The patient's mood and manner are appropriate. Grooming and personal hygiene are appropriate.  MEDICAL DECISION MAKING: Patient here with multiple different complaints. He has been here many times in the past for his complaints of chest pain and has had negative chest x-rays, troponin and d-dimer in the past. Labs and chest x-ray ordered in triage are unremarkable. He is also complaining of throat pain for the past month. He is swallowing his secretions without difficulty. No tonsillar hypertrophy or exudate. No uvular deviation. Also complaining of lower back pain for the past several weeks. No pain on palpation of his back. Neurologic exam is completely normal. Denies any fever. Patient also complaining of tingling in his right arm and leg but currently has completely normal neuro exam, normal sensation. Have discussed with patient that I do not feel that he has any emergent life-threatening process present and I have recommended outpatient follow-up with his primary care provider Dr. Luna Glasgow. Have discussed with patient that given his multiple visits for chronic chest pain I do not feel the emergency department as the appropriate place for this to be  treated. Discussed with him that I feel we have ruled out life-threatening causes for chest pain. Suspect this is chest wall pain as he is tender over the left side of his chest. Have recommended he use Tylenol at home for pain. We'll give a dose of Tylenol in the emergency department. Discussed with him usual and customary return precautions. He verbalizes understanding and is comfortable with this plan.    EKG Interpretation  Date/Time:  Thursday December 04 2014 01:16:21 EDT Ventricular Rate:  63 PR Interval:  162 QRS Duration: 90 QT Interval:  414 QTC Calculation: 423 R Axis:   54 Text Interpretation:  Normal sinus rhythm Normal ECG No significant change since last tracing Confirmed by WARD,  DO, KRISTEN ([id number]) on 12/04/2014 2:04:30 AM        I personally performed the services described in this documentation, which was scribed in my presence. The recorded information has been reviewed and is accurate.     Preston Maw Ward, DO 12/04/14 [phone]

## 2014-12-05 ENCOUNTER — Ambulatory Visit: Payer: Self-pay | Attending: Internal Medicine | Admitting: Physician Assistant

## 2014-12-05 VITALS — BP 109/73 | HR 69 | Temp 97.5°F | Ht 77.0 in | Wt 296.6 lb

## 2014-12-05 DIAGNOSIS — Z7982 Long term (current) use of aspirin: Secondary | ICD-10-CM | POA: Insufficient documentation

## 2014-12-05 DIAGNOSIS — R0602 Shortness of breath: Secondary | ICD-10-CM | POA: Insufficient documentation

## 2014-12-05 DIAGNOSIS — Z79899 Other long term (current) drug therapy: Secondary | ICD-10-CM | POA: Insufficient documentation

## 2014-12-05 DIAGNOSIS — R0789 Other chest pain: Secondary | ICD-10-CM | POA: Insufficient documentation

## 2014-12-05 DIAGNOSIS — K219 Gastro-esophageal reflux disease without esophagitis: Secondary | ICD-10-CM | POA: Insufficient documentation

## 2014-12-05 DIAGNOSIS — F419 Anxiety disorder, unspecified: Secondary | ICD-10-CM | POA: Insufficient documentation

## 2014-12-05 DIAGNOSIS — Q211 Atrial septal defect: Secondary | ICD-10-CM | POA: Insufficient documentation

## 2014-12-05 DIAGNOSIS — R911 Solitary pulmonary nodule: Secondary | ICD-10-CM | POA: Insufficient documentation

## 2014-12-05 MED ORDER — IBUPROFEN 600 MG PO TABS: 600.0000 mg | ORAL_TABLET | Freq: Three times a day (TID) | ORAL | Status: DC | PRN

## 2014-12-05 MED ORDER — PANTOPRAZOLE SODIUM 40 MG PO TBEC: 40.0000 mg | DELAYED_RELEASE_TABLET | Freq: Every day | ORAL | Status: DC

## 2014-12-05 NOTE — Patient Instructions (Signed)
Try Motrin 600 mg three times a day with meals for 10 days. If you have a reaction, please call.  Your Omeprazole was changed to a drug a little more potent Zantac increased to twice daily Keep appt with Laurette Schimke for EGD Keep appt with Vikki Ports for 8/29

## 2014-12-05 NOTE — Progress Notes (Signed)
Chief Complaint: Recurrent chest pain  Subjective: This is a pleasant 34 yo male with GERD, anxiety and recurrent chest pain who has been seen frequently by the emergency department, cardiology and gastroenterology for his symptoms. His most recent visit was yesterday as a matter of fact. His medication regimen consists of a PPI, Zantac, and anxiolytics. He is also on Carafate. He states that the majority of his symptoms started in May of this year. The only finding thus far is that of a PFO. He also was found to have a 4 mm left upper lobe nodule by CTPA. Multiple sets of cardiac enzymes, EKGs, and further imaging studies have been relatively benign.  He describes a burning pressure-like sensation across the anterior chest and sometimes into bilateral rib cage. He can reproduce the pain. He states that in the morning it starts usually lingers on throughout the day. He does get breast at night after he takes his Zantac. He occasionally has shortness of breath and lightheadedness that accompanies the discomfort. He denies any syncope, palpitations or nausea or vomiting.  He has an upcoming appointment with gastroenterology for an EGD on August 29th.  ROS:  GEN: denies fever or chills, denies change in weight HEENT: denies headache, earache, epistaxis, sore throat, or neck pain LUNGS: Occasional SHOB, dyspnea, PND, orthopnea CV: denies CP or palpitations ABD: denies abd pain, N or V    Objective:  Filed Vitals:   12/05/14 1433  BP: 109/73  Pulse: 69  Temp: 97.5 F (36.4 C)  Height:  (1.956 m)  Weight: 296 lb 9.6 oz (134.537 kg)  SpO2: 96%    Physical Exam:  General: in no acute distress. Heart: Normal  s1 &s2  Regular rate and rhythm, without murmurs, rubs, gallops. Lungs: Clear to auscultation bilaterally. Extremities: No clubbing cyanosis or edema with positive pedal pulses.   Pertinent Lab Results: I opted not to repeat an electrocardiogram or labs  today   Medications: Prior to Admission medications   Medication Sig Start Date End Date Taking? Authorizing Provider  aspirin EC 81 MG tablet Take 81 mg by mouth every morning. 09/20/14 09/20/15 Yes Historical Provider, MD  busPIRone (BUSPAR) 10 MG tablet Take 1 tablet (10 mg total) by mouth 2 (two) times daily. 11/04/14  Yes [name], NP  loratadine (CLARITIN) 10 MG tablet Take 10 mg by mouth daily as needed for allergies.   Yes Historical Provider, MD  LORazepam (ATIVAN) 1 MG tablet Take 1 tablet (1 mg total) by mouth daily as needed for anxiety. 11/06/14  Yes [name], MD  ranitidine (ZANTAC) 150 MG tablet Take 150 mg by mouth 2 (two) times daily.   Yes Historical Provider, MD  ibuprofen (ADVIL,MOTRIN) 600 MG tablet Take 1 tablet (600 mg total) by mouth every 8 (eight) hours as needed for mild pain. 12/05/14   Lillyrose Reitan Netta Cedars, PA-C  pantoprazole (PROTONIX) 40 MG tablet Take 1 tablet (40 mg total) by mouth daily. 12/05/14   Vivianne Master, PA-C    Assessment: 1. Recurrent atypical chest pain 2. Gastroesophageal reflux disease 3. Anxiety disorder  Plan: Change omeprazole to Protonix Increase Zantac to twice daily Motrin 600 mg with meals 3 times daily for 10 days Keep appointment with gastroenterology for EGD on August 29th  Follow up: August 30th with [name], nurse practitioner as scheduled  The patient was given clear instructions to go to ER or return to medical center if symptoms don't improve, worsen or new problems develop. The  patient verbalized understanding. The patient was told to call to get lab results if they haven't heard anything in the next week.   This note has been created with Education officer, environmental. Any transcriptional errors are unintentional.   Scot Jun, PA-C 12/05/2014, 2:46 PM

## 2014-12-05 NOTE — Progress Notes (Signed)
Patient presents with intermittent chest pain he describes as 8/10 sharp pain.  He states he thinks it is his acid reflux.  He takes his ranatadine at night and has no problems sleeping but he feels the omeprazole is not helping during the day. Patient quit smoking and does not admit to using drugs or ETOH.

## 2014-12-06 ENCOUNTER — Emergency Department (HOSPITAL_COMMUNITY)
Admission: EM | Admit: 2014-12-06 | Discharge: 2014-12-07 | Disposition: A | Discharge status: Home / Self Care | Payer: Self-pay | Attending: Emergency Medicine | Admitting: Emergency Medicine

## 2014-12-06 ENCOUNTER — Encounter (HOSPITAL_COMMUNITY): Payer: Self-pay | Admitting: Emergency Medicine

## 2014-12-06 DIAGNOSIS — Z79899 Other long term (current) drug therapy: Secondary | ICD-10-CM | POA: Insufficient documentation

## 2014-12-06 DIAGNOSIS — R110 Nausea: Secondary | ICD-10-CM | POA: Insufficient documentation

## 2014-12-06 DIAGNOSIS — Z7982 Long term (current) use of aspirin: Secondary | ICD-10-CM | POA: Insufficient documentation

## 2014-12-06 DIAGNOSIS — K219 Gastro-esophageal reflux disease without esophagitis: Secondary | ICD-10-CM | POA: Insufficient documentation

## 2014-12-06 DIAGNOSIS — R0789 Other chest pain: Secondary | ICD-10-CM | POA: Insufficient documentation

## 2014-12-06 DIAGNOSIS — F419 Anxiety disorder, unspecified: Secondary | ICD-10-CM | POA: Insufficient documentation

## 2014-12-06 DIAGNOSIS — F329 Major depressive disorder, single episode, unspecified: Secondary | ICD-10-CM | POA: Insufficient documentation

## 2014-12-06 DIAGNOSIS — R0602 Shortness of breath: Secondary | ICD-10-CM | POA: Insufficient documentation

## 2014-12-06 DIAGNOSIS — Z87891 Personal history of nicotine dependence: Secondary | ICD-10-CM | POA: Insufficient documentation

## 2014-12-06 NOTE — ED Notes (Addendum)
Pt from home c/o  Left sided chest pain and shortness of breath x 1 hour. HX of same and was seen on 8/18 and 8/19. He reports the pain is worse with movement. Pt does not appear to be in any distress.

## 2014-12-06 NOTE — ED Provider Notes (Signed)
CSN: [id number]     Arrival date & time 12/06/14  2338 History  This chart was scribed for Marisa Severin, MD by Evon Slack, ED Scribe. This patient was seen in room WA10/WA10 and the patient's care was started at 12:01 AM.    Chief Complaint  Patient presents with  . Chest Pain    Patient is a 34 y.o. male presenting with chest pain. The history is provided by the patient. No language interpreter was used.  Chest Pain Associated symptoms: nausea and shortness of breath    HPI Comments: RICKIE GANGE is a 34 y.o. male who presents to the Emergency Department complaining of constant squeezing left sided CP onset 2 hours PTA. Pt states that he has slight SOB and nausea. Pt states that the pain is worse with movement. Pt does report playing basketball this morning. Pt doesn't report any alleviating factors. Pt states that he Maalox with no relief PTA. Pt doesn't report any other symptoms. Pt denies tobacco use in 4 months.   Past Medical History  Diagnosis Date  . Depression   . Anxiety   . Gastroparesis   . Acid reflux    Past Surgical History  Procedure Laterality Date  . No past surgeries     Family History  Problem Relation Age of Onset  . Cancer Father   . Diabetes Father   . Heart attack Other    Social History  Substance Use Topics  . Smoking status: Former Smoker -- 0.00 packs/day    Types: Cigars    Quit date: 09/06/2014  . Smokeless tobacco: Never Used  . Alcohol Use: No    Review of Systems  Respiratory: Positive for shortness of breath.   Cardiovascular: Positive for chest pain.  Gastrointestinal: Positive for nausea.  All other systems reviewed and are negative.    Allergies  Other and Red dye  Home Medications   Prior to Admission medications   Medication Sig Start Date End Date Taking? Authorizing Provider  aspirin EC 81 MG tablet Take 81 mg by mouth every morning. 09/20/14 09/20/15  Historical Provider, MD  busPIRone (BUSPAR) 10 MG tablet Take 1  tablet (10 mg total) by mouth 2 (two) times daily. 11/04/14   [name], NP  ibuprofen (ADVIL,MOTRIN) 600 MG tablet Take 1 tablet (600 mg total) by mouth every 8 (eight) hours as needed for mild pain. 12/05/14   Tiffany Netta Cedars, PA-C  loratadine (CLARITIN) 10 MG tablet Take 10 mg by mouth daily as needed for allergies.    Historical Provider, MD  LORazepam (ATIVAN) 1 MG tablet Take 1 tablet (1 mg total) by mouth daily as needed for anxiety. 11/06/14   [name], MD  pantoprazole (PROTONIX) 40 MG tablet Take 1 tablet (40 mg total) by mouth daily. 12/05/14   Tiffany Netta Cedars, PA-C  ranitidine (ZANTAC) 150 MG tablet Take 150 mg by mouth 2 (two) times daily.    Historical Provider, MD   BP 118/68 mmHg  Pulse 69  Temp(Src) 97.9 F (36.6 C) (Oral)  Resp 19  SpO2 97%   Physical Exam  Constitutional: He is oriented to person, place, and time. He appears well-developed and well-nourished.  HENT:  Head: Normocephalic and atraumatic.  Nose: Nose normal.  Mouth/Throat: Oropharynx is clear and moist.  Eyes: Conjunctivae and EOM are normal. Pupils are equal, round, and reactive to light.  Neck: Normal range of motion. Neck supple. No JVD present. No tracheal deviation present. No thyromegaly present.  Cardiovascular: Normal rate, regular rhythm, normal heart sounds and intact distal pulses.  Exam reveals no gallop and no friction rub.   No murmur heard. Pulmonary/Chest: Effort normal and breath sounds normal. No stridor. No respiratory distress. He has no wheezes. He has no rales. He exhibits tenderness (ttp adjacent to left sternum, no overlying skin changes, no crepitus or deformity).  Abdominal: Soft. Bowel sounds are normal. He exhibits no distension and no mass. There is no tenderness. There is no rebound and no guarding.  Musculoskeletal: Normal range of motion. He exhibits no edema or tenderness.  Lymphadenopathy:    He has no cervical adenopathy.  Neurological: He is alert and oriented to  person, place, and time. He displays normal reflexes. He exhibits normal muscle tone. Coordination normal.  Skin: Skin is warm and dry. No rash noted. No erythema. No pallor.  Psychiatric: He has a normal mood and affect. His behavior is normal. Judgment and thought content normal.  Nursing note and vitals reviewed.   ED Course  Procedures (including critical care time) DIAGNOSTIC STUDIES: Oxygen Saturation is 97% on RA, normal by my interpretation.    COORDINATION OF CARE: 12:04 AM-Discussed treatment plan with pt at bedside and pt agreed to plan.     Labs Review Labs Reviewed - No data to display  Imaging Review No results found. I have personally reviewed and evaluated these images and lab results as part of my medical decision-making.   EKG Interpretation   Date/Time:  Saturday December 06 2014 23:42:15 EDT Ventricular Rate:  76 PR Interval:  158 QRS Duration: 101 QT Interval:  397 QTC Calculation: 446 R Axis:   57 Text Interpretation:  Sinus rhythm Confirmed by Neri Vieyra  MD, Kaoru Rezendes ([id number]) on  12/06/2014 11:52:27 PM      MDM   Final diagnoses:  Chest wall pain  Musculoskeletal chest pain       I personally performed the services described in this documentation, which was scribed in my presence. The recorded information has been reviewed and is accurate.  34 yo male with 2 hours of left sided chest pain, 20 visits in 6 months.  Has had full cardiac w/u in past.  Pain reproducible with palpation, movement.  No treatment prior to arrival.  EKG normal.  Doubt acs, pe, or other life threatening cause.    Marisa Severin, MD 12/07/14 [phone]

## 2014-12-07 LAB — I-STAT TROPONIN, ED: Troponin i, poc: 0 ng/mL (ref 0.00–0.08)

## 2014-12-07 MED ORDER — LIDOCAINE 5 % EX PTCH
1.0000 | MEDICATED_PATCH | CUTANEOUS | Status: DC
  Administered 2014-12-07: 1 via TRANSDERMAL
  Filled 2014-12-07 (×2): qty 1

## 2014-12-07 MED ORDER — LIDOCAINE 5 % EX PTCH: 1.0000 | MEDICATED_PATCH | CUTANEOUS | Status: DC

## 2014-12-07 MED ORDER — ACETAMINOPHEN 325 MG PO TABS
650.0000 mg | ORAL_TABLET | Freq: Once | ORAL | Status: AC
  Administered 2014-12-07: 650 mg via ORAL
  Filled 2014-12-07: qty 2

## 2014-12-07 NOTE — ED Notes (Signed)
MD at bedside. 

## 2014-12-07 NOTE — Discharge Instructions (Signed)
Chest Wall Pain Chest wall pain is pain felt in or around the chest bones and muscles. It may take up to 6 weeks to get better. It may take longer if you are active. Chest wall pain can happen on its own. Other times, things like germs, injury, coughing, or exercise can cause the pain. HOME CARE   Avoid activities that make you tired or cause pain. Try not to use your chest, belly (abdominal), or side muscles. Do not use heavy weights.  Put ice on the sore area.  Put ice in a plastic bag.  Place a towel between your skin and the bag.  Leave the ice on for 15-20 minutes for the first 2 days.  Only take medicine as told by your doctor. GET HELP RIGHT AWAY IF:   You have more pain or are very uncomfortable.  You have a fever.  Your chest pain gets worse.  You have new problems.  You feel sick to your stomach (nauseous) or throw up (vomit).  You start to sweat or feel lightheaded.  You have a cough with mucus (phlegm).  You cough up blood. MAKE SURE YOU:   Understand these instructions.  Will watch your condition.  Will get help right away if you are not doing well or get worse. Document Released: 09/21/2007 Document Revised: 06/27/2011 Document Reviewed: 11/29/2010 [facility] Patient Information 2015 [place], [place]. This information is not intended to replace advice given to you by your health care provider. Make sure you discuss any questions you have with your health care provider.  Musculoskeletal Pain Musculoskeletal pain is muscle and boney aches and pains. These pains can occur in any part of the body. Your caregiver may treat you without knowing the cause of the pain. They may treat you if blood or urine tests, X-rays, and other tests were normal.  CAUSES There is often not a definite cause or reason for these pains. These pains may be caused by a type of germ (virus). The discomfort may also come from overuse. Overuse includes working out too hard when your body is  not fit. Boney aches also come from weather changes. Bone is sensitive to atmospheric pressure changes. HOME CARE INSTRUCTIONS   Ask when your test results will be ready. Make sure you get your test results.  Only take over-the-counter or prescription medicines for pain, discomfort, or fever as directed by your caregiver. If you were given medications for your condition, do not drive, operate machinery or power tools, or sign legal documents for 24 hours. Do not drink alcohol. Do not take sleeping pills or other medications that may interfere with treatment.  Continue all activities unless the activities cause more pain. When the pain lessens, slowly resume normal activities. Gradually increase the intensity and duration of the activities or exercise.  During periods of severe pain, bed rest may be helpful. Lay or sit in any position that is comfortable.  Putting ice on the injured area.  Put ice in a bag.  Place a towel between your skin and the bag.  Leave the ice on for 15 to 20 minutes, 3 to 4 times a day.  Follow up with your caregiver for continued problems and no reason can be found for the pain. If the pain becomes worse or does not go away, it may be necessary to repeat tests or do additional testing. Your caregiver may need to look further for a possible cause. SEEK IMMEDIATE MEDICAL CARE IF:  You have pain that  is getting worse and is not relieved by medications.  You develop chest pain that is associated with shortness or breath, sweating, feeling sick to your stomach (nauseous), or throw up (vomit).  Your pain becomes localized to the abdomen.  You develop any new symptoms that seem different or that concern you. MAKE SURE YOU:   Understand these instructions.  Will watch your condition.  Will get help right away if you are not doing well or get worse. Document Released: 04/04/2005 Document Revised: 06/27/2011 Document Reviewed: 12/07/2012 [facility] Patient  Information 2015 [place], [place]. This information is not intended to replace advice given to you by your health care provider. Make sure you discuss any questions you have with your health care provider.  Pain of Unknown Etiology (Pain Without a Known Cause) You have come to your caregiver because of pain. Pain can occur in any part of the body. Often there is not a definite cause. If your laboratory (blood or urine) work was normal and X-rays or other studies were normal, your caregiver may treat you without knowing the cause of the pain. An example of this is the headache. Most headaches are diagnosed by taking a history. This means your caregiver asks you questions about your headaches. Your caregiver determines a treatment based on your answers. Usually testing done for headaches is normal. Often testing is not done unless there is no response to medications. Regardless of where your pain is located today, you can be given medications to make you comfortable. If no physical cause of pain can be found, most cases of pain will gradually leave as suddenly as they came.  If you have a painful condition and no reason can be found for the pain, it is important that you follow up with your caregiver. If the pain becomes worse or does not go away, it may be necessary to repeat tests and look further for a possible cause.  Only take over-the-counter or prescription medicines for pain, discomfort, or fever as directed by your caregiver.  For the protection of your privacy, test results cannot be given over the phone. Make sure you receive the results of your test. Ask how these results are to be obtained if you have not been informed. It is your responsibility to obtain your test results.  You may continue all activities unless the activities cause more pain. When the pain lessens, it is important to gradually resume normal activities. Resume activities by beginning slowly and gradually increasing the intensity  and duration of the activities or exercise. During periods of severe pain, bed rest may be helpful. Lie or sit in any position that is comfortable.  Ice used for acute (sudden) conditions may be effective. Use a large plastic bag filled with ice and wrapped in a towel. This may provide pain relief.  See your caregiver for continued problems. Your caregiver can help or refer you for exercises or physical therapy if necessary. If you were given medications for your condition, do not drive, operate machinery or power tools, or sign legal documents for 24 hours. Do not drink alcohol, take sleeping pills, or take other medications that may interfere with treatment. See your caregiver immediately if you have pain that is becoming worse and not relieved by medications. Document Released: 12/28/2000 Document Revised: 01/23/2013 Document Reviewed: 04/04/2005 [facility] Patient Information 2015 [place], [place]. This information is not intended to replace advice given to you by your health care provider. Make sure you discuss any questions you have with  your health care provider. ° °

## 2014-12-09 ENCOUNTER — Encounter (HOSPITAL_COMMUNITY): Payer: Self-pay

## 2014-12-09 ENCOUNTER — Emergency Department (HOSPITAL_COMMUNITY)
Admission: EM | Admit: 2014-12-09 | Discharge: 2014-12-09 | Disposition: A | Discharge status: Home / Self Care | Payer: Self-pay | Attending: Emergency Medicine | Admitting: Emergency Medicine

## 2014-12-09 DIAGNOSIS — Z87891 Personal history of nicotine dependence: Secondary | ICD-10-CM | POA: Insufficient documentation

## 2014-12-09 DIAGNOSIS — Z7982 Long term (current) use of aspirin: Secondary | ICD-10-CM | POA: Insufficient documentation

## 2014-12-09 DIAGNOSIS — F329 Major depressive disorder, single episode, unspecified: Secondary | ICD-10-CM | POA: Insufficient documentation

## 2014-12-09 DIAGNOSIS — J029 Acute pharyngitis, unspecified: Secondary | ICD-10-CM | POA: Insufficient documentation

## 2014-12-09 DIAGNOSIS — F419 Anxiety disorder, unspecified: Secondary | ICD-10-CM | POA: Insufficient documentation

## 2014-12-09 DIAGNOSIS — K219 Gastro-esophageal reflux disease without esophagitis: Secondary | ICD-10-CM | POA: Insufficient documentation

## 2014-12-09 DIAGNOSIS — Z79899 Other long term (current) drug therapy: Secondary | ICD-10-CM | POA: Insufficient documentation

## 2014-12-09 LAB — RAPID STREP SCREEN (MED CTR MEBANE ONLY): Streptococcus, Group A Screen (Direct): NEGATIVE

## 2014-12-09 MED ORDER — IBUPROFEN 600 MG PO TABS: 600.0000 mg | ORAL_TABLET | Freq: Three times a day (TID) | ORAL | Status: DC | PRN

## 2014-12-09 NOTE — ED Notes (Signed)
Pt complains of a left sided sore throat for two months worse today

## 2014-12-09 NOTE — ED Provider Notes (Signed)
CSN: [id number]     Arrival date & time 12/09/14  0021 History   First MD Initiated Contact with Patient 12/09/14 0049     Chief Complaint  Patient presents with  . Sore Throat     (Consider location/radiation/quality/duration/timing/severity/associated sxs/prior Treatment) Patient is a 34 y.o. male presenting with pharyngitis. The history is provided by the patient. No language interpreter was used.  Sore Throat This is a recurrent problem. Episode onset: 2 months ago. The problem occurs intermittently. The problem has been waxing and waning. Associated symptoms include a sore throat. Pertinent negatives include no fever, neck pain, rash or vomiting. The symptoms are aggravated by swallowing. He has tried nothing for the symptoms. The treatment provided no relief.    Past Medical History  Diagnosis Date  . Depression   . Anxiety   . Gastroparesis   . Acid reflux    Past Surgical History  Procedure Laterality Date  . No past surgeries     Family History  Problem Relation Age of Onset  . Cancer Father   . Diabetes Father   . Heart attack Other    Social History  Substance Use Topics  . Smoking status: Former Smoker -- 0.00 packs/day    Types: Cigars    Quit date: 09/06/2014  . Smokeless tobacco: Never Used  . Alcohol Use: No    Review of Systems  Constitutional: Negative for fever.  HENT: Positive for sore throat.   Gastrointestinal: Negative for vomiting.  Musculoskeletal: Negative for neck pain.  Skin: Negative for rash.  All other systems reviewed and are negative.   Allergies  Other and Red dye  Home Medications   Prior to Admission medications   Medication Sig Start Date End Date Taking? Authorizing Provider  aspirin EC 81 MG tablet Take 81 mg by mouth every morning. 09/20/14 09/20/15  Historical Provider, MD  busPIRone (BUSPAR) 10 MG tablet Take 1 tablet (10 mg total) by mouth 2 (two) times daily. 11/04/14   [name], NP  ibuprofen (ADVIL,MOTRIN) 600  MG tablet Take 1 tablet (600 mg total) by mouth every 8 (eight) hours as needed for mild pain or moderate pain. 12/09/14   Antony Madura, PA-C  lidocaine (LIDODERM) 5 % Place 1 patch onto the skin daily. Remove & Discard patch within 12 hours or as directed by MD 12/07/14   Marisa Severin, MD  loratadine (CLARITIN) 10 MG tablet Take 10 mg by mouth daily as needed for allergies.    Historical Provider, MD  LORazepam (ATIVAN) 1 MG tablet Take 1 tablet (1 mg total) by mouth daily as needed for anxiety. Patient not taking: Reported on 12/07/2014 11/06/14   [name], MD  pantoprazole (PROTONIX) 40 MG tablet Take 1 tablet (40 mg total) by mouth daily. 12/05/14   Tiffany Netta Cedars, PA-C  ranitidine (ZANTAC) 150 MG tablet Take 150 mg by mouth 2 (two) times daily.    Historical Provider, MD   BP 115/62 mmHg  Pulse 72  Temp(Src) 98.3 F (36.8 C) (Oral)  Resp 18  Ht  (1.956 m)  Wt 290 lb (131.543 kg)  BMI 34.38 kg/m2  SpO2 97%   Physical Exam  Constitutional: He is oriented to person, place, and time. He appears well-developed and well-nourished. No distress.  Nontoxic/nonseptic appearing  HENT:  Head: Normocephalic and atraumatic.  Right Ear: Tympanic membrane, external ear and ear canal normal.  Left Ear: [id number] membrane, external ear and ear canal normal.  Nose: Nose normal.  Mouth/Throat: Uvula is midline, oropharynx is clear and moist and mucous membranes are normal. No oropharyngeal exudate, posterior oropharyngeal edema or posterior oropharyngeal erythema.  No trismus, stridor, or uvula deviation. Patient tolerating secretions without difficulty.  Eyes: Conjunctivae and EOM are normal. No scleral icterus.  Neck: Normal range of motion.  No nuchal rigidity or meningismus  Pulmonary/Chest: Effort normal. No respiratory distress.  Musculoskeletal: Normal range of motion.  Neurological: He is alert and oriented to person, place, and time. He exhibits normal muscle tone. Coordination normal.   Skin: Skin is warm and dry. No rash noted. He is not diaphoretic. No erythema. No pallor.  Psychiatric: He has a normal mood and affect. His behavior is normal.  Nursing note and vitals reviewed.   ED Course  Procedures (including critical care time) Labs Review Labs Reviewed  RAPID STREP SCREEN (NOT AT [facility])  CULTURE, GROUP A STREP    Imaging Review No results found.   I have personally reviewed and evaluated these images and lab results as part of my medical decision-making.   EKG Interpretation None      MDM   Final diagnoses:  Pharyngitis    Pt afebrile without tonsillar exudate, negative strep. Presents with mild cervical lymphadenopathy and dysphagia; diagnosis of viral pharyngitis. No abx indicated; will d/c symptomatic tx for pain. Presentation not concerning for PTA or infxn spread to soft tissue. No trismus or uvula deviation. Specific return precautions discussed. Recommended PCP follow up. Patient discharged in good condition.   Filed Vitals:   12/09/14 0054  BP: 115/62  Pulse: 72  Temp: 98.3 F (36.8 C)  TempSrc: Oral  Resp: 18  Height: 6\' 5"  (1.956 m)  Weight: 290 lb (131.543 kg)  SpO2: 97%     Antony Madura, PA-C 12/09/14 0235  [name], MD 12/09/14 [id number]

## 2014-12-09 NOTE — Discharge Instructions (Signed)

## 2014-12-10 ENCOUNTER — Emergency Department (HOSPITAL_COMMUNITY)
Admission: EM | Admit: 2014-12-10 | Discharge: 2014-12-10 | Disposition: A | Discharge status: Home / Self Care | Payer: Self-pay | Attending: Emergency Medicine | Admitting: Emergency Medicine

## 2014-12-10 ENCOUNTER — Encounter (HOSPITAL_COMMUNITY): Payer: Self-pay | Admitting: Adult Health

## 2014-12-10 DIAGNOSIS — J029 Acute pharyngitis, unspecified: Secondary | ICD-10-CM | POA: Insufficient documentation

## 2014-12-10 DIAGNOSIS — K219 Gastro-esophageal reflux disease without esophagitis: Secondary | ICD-10-CM | POA: Insufficient documentation

## 2014-12-10 DIAGNOSIS — Z79899 Other long term (current) drug therapy: Secondary | ICD-10-CM | POA: Insufficient documentation

## 2014-12-10 DIAGNOSIS — Z87891 Personal history of nicotine dependence: Secondary | ICD-10-CM | POA: Insufficient documentation

## 2014-12-10 DIAGNOSIS — F419 Anxiety disorder, unspecified: Secondary | ICD-10-CM | POA: Insufficient documentation

## 2014-12-10 DIAGNOSIS — Z7982 Long term (current) use of aspirin: Secondary | ICD-10-CM | POA: Insufficient documentation

## 2014-12-10 DIAGNOSIS — F329 Major depressive disorder, single episode, unspecified: Secondary | ICD-10-CM | POA: Insufficient documentation

## 2014-12-10 LAB — RAPID STREP SCREEN (MED CTR MEBANE ONLY): Streptococcus, Group A Screen (Direct): NEGATIVE

## 2014-12-10 MED ORDER — CETIRIZINE HCL 10 MG PO TABS: 10.0000 mg | ORAL_TABLET | Freq: Every day | ORAL | Status: DC

## 2014-12-10 MED ORDER — NAPROXEN 250 MG PO TABS: 250.0000 mg | ORAL_TABLET | Freq: Two times a day (BID) | ORAL | Status: DC

## 2014-12-10 MED ORDER — PANTOPRAZOLE SODIUM 40 MG PO TBEC: 40.0000 mg | DELAYED_RELEASE_TABLET | Freq: Every day | ORAL | Status: DC

## 2014-12-10 NOTE — ED Provider Notes (Signed)
CSN: [id number]     Arrival date & time 12/10/14  2120 History  This chart was scribed for non-physician practitioner, Everlene Farrier, PA-C working with Samuel Jester, DO by Gwenyth Ober, ED scribe. This patient was seen in room TR11C/TR11C and the patient's care was started at 10:40 PM    Chief Complaint  Patient presents with  . Sore Throat   The history is provided by the patient. No language interpreter was used.   HPI Comments: Preston [name] is a 34 y.o. male with a history of anxiety, depression and acid reflux, who presents to the Emergency Department complaining of intermittent, gradually worsening, 9/10, left-sided throat pain that started yesterday. His pain becomes worse with swallowing. Pt was seen in the ED yesterday for the same and was prescribed Ibuprofen. He has not taken the treatment because he did not think it would help. Pt takes pantoprazole for acid reflux, which improved but did not resolve his symptoms. Pt has a follow-up with his PCP in 6 days. He denies post-nasal drip, rhinorrhea, purulent drainage from his mouth, fever, chills, itchy or watery eyes, ear pain, neck pain, HA, rash, trouble swallowing, cough, wheezing and SOB as associated symptoms.   Past Medical History  Diagnosis Date  . Depression   . Anxiety   . Gastroparesis   . Acid reflux    Past Surgical History  Procedure Laterality Date  . No past surgeries     Family History  Problem Relation Age of Onset  . Cancer Father   . Diabetes Father   . Heart attack Other    Social History  Substance Use Topics  . Smoking status: Former Smoker -- 0.00 packs/day    Types: Cigars    Quit date: 09/06/2014  . Smokeless tobacco: Never Used  . Alcohol Use: No    Review of Systems  Constitutional: Negative for fever and chills.  HENT: Positive for sore throat. Negative for dental problem, drooling, ear discharge, ear pain, facial swelling, mouth sores, postnasal drip, rhinorrhea, sinus pressure  and trouble swallowing.   Eyes: Negative for itching and visual disturbance.  Respiratory: Negative for cough and shortness of breath.   Cardiovascular: Negative for chest pain.  Gastrointestinal: Negative for nausea, vomiting and abdominal pain.  Musculoskeletal: Negative for myalgias, neck pain and neck stiffness.  Skin: Negative for rash.  Neurological: Negative for light-headedness and headaches.      Allergies  Other and Red dye  Home Medications   Prior to Admission medications   Medication Sig Start Date End Date Taking? Authorizing Provider  aspirin EC 81 MG tablet Take 81 mg by mouth every morning. 09/20/14 09/20/15  Historical Provider, MD  busPIRone (BUSPAR) 10 MG tablet Take 1 tablet (10 mg total) by mouth 2 (two) times daily. 11/04/14   [name], NP  cetirizine (ZYRTEC ALLERGY) 10 MG tablet Take 1 tablet (10 mg total) by mouth daily. 12/10/14   Everlene Farrier, PA-C  lidocaine (LIDODERM) 5 % Place 1 patch onto the skin daily. Remove & Discard patch within 12 hours or as directed by MD 12/07/14   Marisa Severin, MD  loratadine (CLARITIN) 10 MG tablet Take 10 mg by mouth daily as needed for allergies.    Historical Provider, MD  LORazepam (ATIVAN) 1 MG tablet Take 1 tablet (1 mg total) by mouth daily as needed for anxiety. Patient not taking: Reported on 12/07/2014 11/06/14   [name], MD  naproxen (NAPROSYN) 250 MG tablet Take 1 tablet (250 mg  total) by mouth 2 (two) times daily with a meal. 12/10/14   Everlene Farrier, PA-C  pantoprazole (PROTONIX) 40 MG tablet Take 1 tablet (40 mg total) by mouth daily. 12/10/14   Everlene Farrier, PA-C  ranitidine (ZANTAC) 150 MG tablet Take 150 mg by mouth 2 (two) times daily.    Historical Provider, MD   BP 120/74 mmHg  Pulse 73  Temp(Src) 97.8 F (36.6 C) (Oral)  Resp 18  SpO2 98% Physical Exam  Constitutional: He is oriented to person, place, and time. He appears well-developed and well-nourished. No distress.  Non-toxic appearing   HENT:  Head: Normocephalic and atraumatic.  Right Ear: External ear normal.  Left Ear: External ear normal.  Nose: Nose normal.  Mouth/Throat: No oropharyngeal exudate.  No post-oropharyngeal edema. Mild posterior oropharyngeal erythema. Uvula midline, no edema. No tonsillar hypertrophy or exudates. No peritonsillar abscess. Tongue protrusion is normal. No trismus. Bilateral tympanic membranes are pearly-gray without erythema or loss of landmarks.   Eyes: Conjunctivae are normal. Pupils are equal, round, and reactive to light. Right eye exhibits no discharge. Left eye exhibits no discharge.  Neck: Normal range of motion. Neck supple. No JVD present. No tracheal deviation present.  Mild left-sided submandibular lymphadenopathy.   Cardiovascular: Normal rate, regular rhythm, normal heart sounds and intact distal pulses.   Pulmonary/Chest: Effort normal and breath sounds normal. No respiratory distress. He has no wheezes. He has no rales.  Lungs are clear to auscultation bilaterally.  Abdominal: Soft. There is no tenderness.  Lymphadenopathy:    He has cervical adenopathy.  Neurological: He is alert and oriented to person, place, and time. Coordination normal.  Skin: Skin is warm and dry. No rash noted. He is not diaphoretic. No erythema. No pallor.  Psychiatric: He has a normal mood and affect. His behavior is normal.  Nursing note and vitals reviewed.   ED Course  Procedures   DIAGNOSTIC STUDIES: Oxygen Saturation is 98% on RA, normal by my interpretation.    COORDINATION OF CARE: 10:47 PM Discussed treatment plan with pt which includes Zyrtec, increasing Omeprazole to BID and Naprosyn. Will refer to HENT. Advised pt to follow-up with PCP. Pt agreed to plan.   Labs Review Labs Reviewed  RAPID STREP SCREEN (NOT AT [facility])  CULTURE, GROUP A STREP    Imaging Review No results found.   EKG Interpretation None      Filed Vitals:   12/10/14 2131  BP: 120/74  Pulse: 73   Temp: 97.8 F (36.6 C)  TempSrc: Oral  Resp: 18  SpO2: 98%     MDM   Meds given in ED:  Medications - No data to display  New Prescriptions   CETIRIZINE (ZYRTEC ALLERGY) 10 MG TABLET    Take 1 tablet (10 mg total) by mouth daily.   NAPROXEN (NAPROSYN) 250 MG TABLET    Take 1 tablet (250 mg total) by mouth 2 (two) times daily with a meal.   PANTOPRAZOLE (PROTONIX) 40 MG TABLET    Take 1 tablet (40 mg total) by mouth daily.    Final diagnoses:  Sore throat  Gastroesophageal reflux disease, esophagitis presence not specified    This is a 34 y.o. male with a history of anxiety, depression and acid reflux, who presents to the Emergency Department complaining of intermittent, gradually worsening, 9/10, left-sided throat pain that started yesterday. His pain becomes worse with swallowing. Pt was seen in the ED yesterday for the same and was prescribed Ibuprofen. He has not taken  the treatment because he did not think it would help. Pt takes pantoprazole for acid reflux, which improved but did not resolve his symptoms.  On exam the patient is afebrile and nontoxic appearing. He has a negative rapid strep test. He has mild posterior oropharyngeal erythema but no edema. No tonsillar hypertrophy or exudates. No peritonsillar abscess. No trismus. Patient does report he still has continued acid reflux symptoms. I encouraged him to take his pantoprazole daily and was provided with education on GERD diet. I suspect viral sore throat. Will provide prescriptions for naproxen and Zyrtec. I encouraged follow-up with his primary care provider. Also provided information for follow-up with ENT if his symptoms continue to persist. Education on return precautions. I advised the patient to follow-up with their primary care provider this week. I advised the patient to return to the emergency department with new or worsening symptoms or new concerns. The patient verbalized understanding and agreement with plan.      I personally performed the services described in this documentation, which was scribed in my presence. The recorded information has been reviewed and is accurate.      Everlene Farrier, PA-C 12/10/14 2303  Samuel Jester, DO 12/12/14 [phone]

## 2014-12-10 NOTE — Discharge Instructions (Signed)
Pharyngitis Pharyngitis is redness, pain, and swelling (inflammation) of your pharynx.  CAUSES  Pharyngitis is usually caused by infection. Most of the time, these infections are from viruses (viral) and are part of a cold. However, sometimes pharyngitis is caused by bacteria (bacterial). Pharyngitis can also be caused by allergies. Viral pharyngitis may be spread from person to person by coughing, sneezing, and personal items or utensils (cups, forks, spoons, toothbrushes). Bacterial pharyngitis may be spread from person to person by more intimate contact, such as kissing.  SIGNS AND SYMPTOMS  Symptoms of pharyngitis include:   Sore throat.   Tiredness (fatigue).   Low-grade fever.   Headache.  Joint pain and muscle aches.  Skin rashes.  Swollen lymph nodes.  Plaque-like film on throat or tonsils (often seen with bacterial pharyngitis). DIAGNOSIS  Your health care provider will ask you questions about your illness and your symptoms. Your medical history, along with a physical exam, is often all that is needed to diagnose pharyngitis. Sometimes, a rapid strep test is done. Other lab tests may also be done, depending on the suspected cause.  TREATMENT  Viral pharyngitis will usually get better in 3-4 days without the use of medicine. Bacterial pharyngitis is treated with medicines that kill germs (antibiotics).  HOME CARE INSTRUCTIONS   Drink enough water and fluids to keep your urine clear or pale yellow.   Only take over-the-counter or prescription medicines as directed by your health care provider:   If you are prescribed antibiotics, make sure you finish them even if you start to feel better.   Do not take aspirin.   Get lots of rest.   Gargle with 8 oz of salt water ( tsp of salt per 1 qt of water) as often as every 1-2 hours to soothe your throat.   Throat lozenges (if you are not at risk for choking) or sprays may be used to soothe your throat. SEEK MEDICAL  CARE IF:   You have large, tender lumps in your neck.  You have a rash.  You cough up green, yellow-brown, or bloody spit. SEEK IMMEDIATE MEDICAL CARE IF:   Your neck becomes stiff.  You drool or are unable to swallow liquids.  You vomit or are unable to keep medicines or liquids down.  You have severe pain that does not go away with the use of recommended medicines.  You have trouble breathing (not caused by a stuffy nose). MAKE SURE YOU:   Understand these instructions.  Will watch your condition.  Will get help right away if you are not doing well or get worse. Document Released: 04/04/2005 Document Revised: 01/23/2013 Document Reviewed: 12/10/2012 [facility] Patient Information 2015 [place], [place]. This information is not intended to replace advice given to you by your health care provider. Make sure you discuss any questions you have with your health care provider. Food Choices for Gastroesophageal Reflux Disease When you have gastroesophageal reflux disease (GERD), the foods you eat and your eating habits are very important. Choosing the right foods can help ease the discomfort of GERD. WHAT GENERAL GUIDELINES DO I NEED TO FOLLOW?  Choose fruits, vegetables, whole grains, low-fat dairy products, and low-fat meat, fish, and poultry.  Limit fats such as oils, salad dressings, butter, nuts, and avocado.  Keep a food diary to identify foods that cause symptoms.  Avoid foods that cause reflux. These may be different for different people.  Eat frequent small meals instead of three large meals each day.  Eat your meals  slowly, in a relaxed setting.  Limit fried foods.  Cook foods using methods other than frying.  Avoid drinking alcohol.  Avoid drinking large amounts of liquids with your meals.  Avoid bending over or lying down until 2-3 hours after eating. WHAT FOODS ARE NOT RECOMMENDED? The following are some foods and drinks that may worsen your  symptoms: Vegetables Tomatoes. Tomato juice. Tomato and spaghetti sauce. Chili peppers. Onion and garlic. Horseradish. Fruits Oranges, grapefruit, and lemon (fruit and juice). Meats High-fat meats, fish, and poultry. This includes hot dogs, ribs, ham, sausage, salami, and bacon. Dairy Whole milk and chocolate milk. Sour cream. Cream. Butter. Ice cream. Cream cheese.  Beverages Coffee and tea, with or without caffeine. Carbonated beverages or energy drinks. Condiments Hot sauce. Barbecue sauce.  Sweets/Desserts Chocolate and cocoa. Donuts. Peppermint and spearmint. Fats and Oils High-fat foods, including [place] fries and potato chips. Other Vinegar. Strong spices, such as black pepper, white pepper, red pepper, cayenne, curry powder, cloves, ginger, and chili powder. The items listed above may not be a complete list of foods and beverages to avoid. Contact your dietitian for more information. Document Released: 04/04/2005 Document Revised: 04/09/2013 Document Reviewed: 02/06/2013 [facility] Patient Information 2015 [place], [place]. This information is not intended to replace advice given to you by your health care provider. Make sure you discuss any questions you have with your health care provider. Gastroesophageal Reflux Disease, Adult Gastroesophageal reflux disease (GERD) happens when acid from your stomach flows up into the esophagus. When acid comes in contact with the esophagus, the acid causes soreness (inflammation) in the esophagus. Over time, GERD may create small holes (ulcers) in the lining of the esophagus. CAUSES   Increased body weight. This puts pressure on the stomach, making acid rise from the stomach into the esophagus.  Smoking. This increases acid production in the stomach.  Drinking alcohol. This causes decreased pressure in the lower esophageal sphincter (valve or ring of muscle between the esophagus and stomach), allowing acid from the stomach into the  esophagus.  Late evening meals and a full stomach. This increases pressure and acid production in the stomach.  A malformed lower esophageal sphincter. Sometimes, no cause is found. SYMPTOMS   Burning pain in the lower part of the mid-chest behind the breastbone and in the mid-stomach area. This may occur twice a week or more often.  Trouble swallowing.  Sore throat.  Dry cough.  Asthma-like symptoms including chest tightness, shortness of breath, or wheezing. DIAGNOSIS  Your caregiver may be able to diagnose GERD based on your symptoms. In some cases, X-rays and other tests may be done to check for complications or to check the condition of your stomach and esophagus. TREATMENT  Your caregiver may recommend over-the-counter or prescription medicines to help decrease acid production. Ask your caregiver before starting or adding any new medicines.  HOME CARE INSTRUCTIONS   Change the factors that you can control. Ask your caregiver for guidance concerning weight loss, quitting smoking, and alcohol consumption.  Avoid foods and drinks that make your symptoms worse, such as:  Caffeine or alcoholic drinks.  Chocolate.  Peppermint or mint flavorings.  Garlic and onions.  Spicy foods.  Citrus fruits, such as oranges, lemons, or limes.  Tomato-based foods such as sauce, chili, salsa, and pizza.  Fried and fatty foods.  Avoid lying down for the 3 hours prior to your bedtime or prior to taking a nap.  Eat small, frequent meals instead of large meals.  Wear loose-fitting clothing.  Do not wear anything tight around your waist that causes pressure on your stomach.  Raise the head of your bed 6 to 8 inches with wood blocks to help you sleep. Extra pillows will not help.  Only take over-the-counter or prescription medicines for pain, discomfort, or fever as directed by your caregiver.  Do not take aspirin, ibuprofen, or other nonsteroidal anti-inflammatory drugs (NSAIDs). SEEK  IMMEDIATE MEDICAL CARE IF:   You have pain in your arms, neck, jaw, teeth, or back.  Your pain increases or changes in intensity or duration.  You develop nausea, vomiting, or sweating (diaphoresis).  You develop shortness of breath, or you faint.  Your vomit is green, yellow, black, or looks like coffee grounds or blood.  Your stool is red, bloody, or black. These symptoms could be signs of other problems, such as heart disease, gastric bleeding, or esophageal bleeding. MAKE SURE YOU:   Understand these instructions.  Will watch your condition.  Will get help right away if you are not doing well or get worse. Document Released: 01/12/2005 Document Revised: 06/27/2011 Document Reviewed: 10/22/2010 [facility] Patient Information 2015 [place], [place]. This information is not intended to replace advice given to you by your health care provider. Make sure you discuss any questions you have with your health care provider.

## 2014-12-10 NOTE — ED Notes (Signed)
Presents with left sided sore throat since Monday, pain is worse with movement-able to eat and drink, denies fevers, handling secretions, no exudate noted.

## 2014-12-11 LAB — CULTURE, GROUP A STREP

## 2014-12-14 LAB — CULTURE, GROUP A STREP: Strep A Culture: NEGATIVE

## 2014-12-16 ENCOUNTER — Ambulatory Visit: Payer: Self-pay

## 2014-12-16 ENCOUNTER — Ambulatory Visit: Payer: Self-pay | Admitting: Internal Medicine

## 2014-12-24 ENCOUNTER — Ambulatory Visit: Payer: Self-pay | Attending: Internal Medicine

## 2014-12-25 ENCOUNTER — Ambulatory Visit: Payer: Self-pay | Attending: Internal Medicine | Admitting: Internal Medicine

## 2014-12-25 ENCOUNTER — Encounter: Payer: Self-pay | Admitting: Internal Medicine

## 2014-12-25 VITALS — BP 118/78 | HR 62 | Temp 97.8°F | Resp 16 | Ht 77.0 in | Wt 294.2 lb

## 2014-12-25 DIAGNOSIS — F419 Anxiety disorder, unspecified: Secondary | ICD-10-CM

## 2014-12-25 DIAGNOSIS — R0789 Other chest pain: Secondary | ICD-10-CM

## 2014-12-25 MED ORDER — LIDOCAINE 5 % EX PTCH: 1.0000 | MEDICATED_PATCH | CUTANEOUS | Status: DC

## 2014-12-25 MED ORDER — BUSPIRONE HCL 10 MG PO TABS: 10.0000 mg | ORAL_TABLET | Freq: Two times a day (BID) | ORAL | Status: DC

## 2014-12-25 NOTE — Progress Notes (Signed)
Patient ID: Preston [name], male   DOB: [birth year], 34 y.o.   MRN: [id number]  CC: anxiety  HPI: Preston [name] is a 34 y.o. male here today for a follow up visit.  Patient has past medical history of anxiety, GERD, and chest pain. Patient reports that he was recently seen by Dr. Kinnie [name] with [facility] in [facility]. He reports that a endoscopy was performed which found "infection with inflammation". Biopsies were taken and it was normal per patient. I am unable to find records of colonoscopy report in EMR. He has a follw up on the 20th of next month and states that he was given any medication for infection. He continues to report left sided chest pain.   Allergies  Allergen Reactions  . Other     Gi cocktail "it makes me feel like my throat is closing up"  . Red Dye Itching   Past Medical History  Diagnosis Date  . Depression   . Anxiety   . Gastroparesis   . Acid reflux    Current Outpatient Prescriptions on File Prior to Visit  Medication Sig Dispense Refill  . aspirin EC 81 MG tablet Take 81 mg by mouth every morning.    . busPIRone (BUSPAR) 10 MG tablet Take 1 tablet (10 mg total) by mouth 2 (two) times daily. 60 tablet 1  . lidocaine (LIDODERM) 5 % Place 1 patch onto the skin daily. Remove & Discard patch within 12 hours or as directed by MD 10 patch 0  . loratadine (CLARITIN) 10 MG tablet Take 10 mg by mouth daily as needed for allergies.    Marland Kitchen LORazepam (ATIVAN) 1 MG tablet Take 1 tablet (1 mg total) by mouth daily as needed for anxiety. 10 tablet 0  . pantoprazole (PROTONIX) 40 MG tablet Take 1 tablet (40 mg total) by mouth daily. 30 tablet 1  . ranitidine (ZANTAC) 150 MG tablet Take 150 mg by mouth 2 (two) times daily.    . cetirizine (ZYRTEC ALLERGY) 10 MG tablet Take 1 tablet (10 mg total) by mouth daily. (Patient not taking: Reported on 12/25/2014) 30 tablet 1  . naproxen (NAPROSYN) 250 MG tablet Take 1 tablet (250 mg total) by mouth 2 (two) times daily  with a meal. (Patient not taking: Reported on 12/25/2014) 30 tablet 0   No current facility-administered medications on file prior to visit.   Family History  Problem Relation Age of Onset  . Cancer Father   . Diabetes Father   . Heart attack Other    Social History   Social History  . Marital Status: Single    Spouse Name: N/A  . Number of Children: N/A  . Years of Education: N/A   Occupational History  . Not on file.   Social History Main Topics  . Smoking status: Former Smoker -- 0.00 packs/day    Types: Cigars    Quit date: 09/06/2014  . Smokeless tobacco: Never Used  . Alcohol Use: No  . Drug Use: No     Comment: last use one month ago (10/07/14)  . Sexual Activity: Not on file   Other Topics Concern  . Not on file   Social History Narrative    Review of Systems: Other than what is stated in HPI, all other systems are negative.   Objective:   Filed Vitals:   12/25/14 1544  BP: 118/78  Pulse: 62  Temp: 97.8 F (36.6 C)  Resp: 16    Physical Exam  Constitutional: He is oriented to person, place, and time. He appears well-nourished.  Cardiovascular: Normal rate, regular rhythm and normal heart sounds.   Pulmonary/Chest: Effort normal and breath sounds normal.  Abdominal: Soft. There is no tenderness.  Musculoskeletal: He exhibits no tenderness (left chest).  Neurological: He is alert and oriented to person, place, and time.  Skin: Skin is warm and dry.     Lab Results  Component Value Date   WBC 7.6 12/04/2014   HGB 14.4 12/04/2014   HCT 42.2 12/04/2014   MCV 86.3 12/04/2014   PLT 314 12/04/2014   Lab Results  Component Value Date   CREATININE 1.28* 12/04/2014   BUN 8 12/04/2014   NA 140 12/04/2014   K 4.1 12/04/2014   CL 104 12/04/2014   CO2 28 12/04/2014    No results found for: HGBA1C Lipid Panel  No results found for: CHOL, TRIG, HDL, CHOLHDL, VLDL, LDLCALC     Assessment and plan:   Preston [name] was seen today for  follow-up.  Diagnoses and all orders for this visit:  Anxiety -     Refill busPIRone (BUSPAR) 10 MG tablet; Take 1 tablet (10 mg total) by mouth 2 (two) times daily.  I have advised patient to go to [facility] the [place]. He reports that he went one time and it took to long. He states that he does not feel he needs psychiatry services anyway.   Musculoskeletal chest pain -     Refill lidocaine (LIDODERM) 5 %; Place 1 patch onto the skin daily. Remove & Discard patch within 12 hours or as directed by MD (Patient taking differently: Place 1 patch onto the skin daily as needed. Remove & Discard patch within 12 hours or as directed by MD) I feel comfortable discharging patient. He has had a total of 23 ER visits in the past 6 months with other multiple visits to [facility] and [facility]. He has had several EKG's, troponin's, and a extensive cardiac workup by a cardiologist that was normal. Patient is high risk for going back to ER, I will continue to follow him closely. I have advised patient to call office for support before going to ER.    Return if symptoms worsen or fail to improve.        [name], NP-C [facility] [phone] 12/25/2014, 4:18 PM

## 2014-12-25 NOTE — Progress Notes (Signed)
Patient here for follow up from the ed Patient was seen for chest pains and gerd Patient stated that his ekg and cardiac enzymes were all normal Patient also requesting refills on some of his medications

## 2014-12-31 ENCOUNTER — Emergency Department (HOSPITAL_COMMUNITY): Payer: Self-pay

## 2014-12-31 ENCOUNTER — Emergency Department (HOSPITAL_COMMUNITY)
Admission: EM | Admit: 2014-12-31 | Discharge: 2014-12-31 | Disposition: A | Discharge status: Home / Self Care | Payer: Self-pay | Attending: Emergency Medicine | Admitting: Emergency Medicine

## 2014-12-31 ENCOUNTER — Encounter (HOSPITAL_COMMUNITY): Payer: Self-pay | Admitting: Emergency Medicine

## 2014-12-31 DIAGNOSIS — R0789 Other chest pain: Secondary | ICD-10-CM | POA: Insufficient documentation

## 2014-12-31 DIAGNOSIS — K219 Gastro-esophageal reflux disease without esophagitis: Secondary | ICD-10-CM | POA: Insufficient documentation

## 2014-12-31 DIAGNOSIS — Z7982 Long term (current) use of aspirin: Secondary | ICD-10-CM | POA: Insufficient documentation

## 2014-12-31 DIAGNOSIS — F419 Anxiety disorder, unspecified: Secondary | ICD-10-CM | POA: Insufficient documentation

## 2014-12-31 DIAGNOSIS — F329 Major depressive disorder, single episode, unspecified: Secondary | ICD-10-CM | POA: Insufficient documentation

## 2014-12-31 DIAGNOSIS — Z79899 Other long term (current) drug therapy: Secondary | ICD-10-CM | POA: Insufficient documentation

## 2014-12-31 DIAGNOSIS — J069 Acute upper respiratory infection, unspecified: Secondary | ICD-10-CM | POA: Insufficient documentation

## 2014-12-31 LAB — BASIC METABOLIC PANEL
ANION GAP: 8 (ref 5–15)
BUN: 13 mg/dL (ref 6–20)
CALCIUM: 9.4 mg/dL (ref 8.9–10.3)
CO2: 24 mmol/L (ref 22–32)
Chloride: 107 mmol/L (ref 101–111)
Creatinine, Ser: 1.2 mg/dL (ref 0.61–1.24)
GFR calc Af Amer: >60 mL/min (ref >60)
GFR calc non Af Amer: >60 mL/min (ref >60)
GLUCOSE: 126 mg/dL — AB (ref 65–99)
Potassium: 4 mmol/L (ref 3.5–5.1)
Sodium: 139 mmol/L (ref 135–145)

## 2014-12-31 LAB — CBC
HEMATOCRIT: 42.6 % (ref 39.0–52.0)
HEMOGLOBIN: 14.4 g/dL (ref 13.0–17.0)
MCH: 29.3 pg (ref 26.0–34.0)
MCHC: 33.8 g/dL (ref 30.0–36.0)
MCV: 86.6 fL (ref 78.0–100.0)
Platelets: 298 10*3/uL (ref 150–400)
RBC: 4.92 MIL/uL (ref 4.22–5.81)
RDW: 13.3 % (ref 11.5–15.5)
WBC: 8.9 10*3/uL (ref 4.0–10.5)

## 2014-12-31 LAB — I-STAT TROPONIN, ED: Troponin i, poc: 0 ng/mL (ref 0.00–0.08)

## 2014-12-31 MED ORDER — BENZONATATE 100 MG PO CAPS: 100.0000 mg | ORAL_CAPSULE | Freq: Three times a day (TID) | ORAL | Status: DC

## 2014-12-31 MED ORDER — SALINE SPRAY 0.65 % NA SOLN
1.0000 | Freq: Once | NASAL | Status: AC
  Administered 2014-12-31: 1 via NASAL
  Filled 2014-12-31: qty 44

## 2014-12-31 NOTE — ED Provider Notes (Signed)
CSN: [id number]     Arrival date & time 12/31/14  0105 History  This chart was scribed for non-physician practitioner Francee Piccolo, PA-C working with Tomasita Crumble, MD by Lyndel Safe, ED Scribe. This patient was seen in room D35C/D35C and the patient's care was started at 3:21 AM.    Chief Complaint  Patient presents with  . Chest Pain    The history is provided by the patient. No language interpreter was used.   HPI Comments: Preston [name] is a 34 y.o. male, with a PMhx of depression, anxiety, and GERD, who presents to the Emergency Department complaining of intermittent, central, chest pain that he describes as a pressure onset 4 days ago while watching a football game. He associates a cough and nasal congestion and states he feels he is getting an URI. His CP is exacerbated with coughing. Sick contact with girlfriend, Denies fevers, chills, BLE edema, recent surgeries, PMhx of PE/DVT, DM, HTN or HLD, PFhx of MI before the age of 11.   Past Medical History  Diagnosis Date  . Depression   . Anxiety   . Gastroparesis   . Acid reflux    Past Surgical History  Procedure Laterality Date  . No past surgeries     Family History  Problem Relation Age of Onset  . Cancer Father   . Diabetes Father   . Heart attack Other    Social History  Substance Use Topics  . Smoking status: Former Smoker -- 0.00 packs/day    Types: Cigars    Quit date: 09/06/2014  . Smokeless tobacco: Never Used  . Alcohol Use: No    Review of Systems  Constitutional: Negative for chills.  HENT: Positive for congestion.   Respiratory: Positive for cough.   Cardiovascular: Positive for chest pain. Negative for leg swelling.  All other systems reviewed and are negative.  Allergies  Other and Red dye  Home Medications   Prior to Admission medications   Medication Sig Start Date End Date Taking? Authorizing Provider  aspirin EC 81 MG tablet Take 81 mg by mouth every morning. 09/20/14 09/20/15 Yes  Historical Provider, MD  busPIRone (BUSPAR) 10 MG tablet Take 1 tablet (10 mg total) by mouth 2 (two) times daily. Patient taking differently: Take 10 mg by mouth 3 (three) times daily.  12/25/14  Yes [name], NP  cetirizine (ZYRTEC ALLERGY) 10 MG tablet Take 1 tablet (10 mg total) by mouth daily. 12/10/14  Yes Everlene Farrier, PA-C  lidocaine (LIDODERM) 5 % Place 1 patch onto the skin daily. Remove & Discard patch within 12 hours or as directed by MD Patient taking differently: Place 1 patch onto the skin daily as needed. Remove & Discard patch within 12 hours or as directed by MD 12/25/14  Yes [name], NP  pantoprazole (PROTONIX) 40 MG tablet Take 1 tablet (40 mg total) by mouth daily. 12/10/14  Yes Everlene Farrier, PA-C  ranitidine (ZANTAC) 150 MG tablet Take 150 mg by mouth 2 (two) times daily.   Yes Historical Provider, MD  benzonatate (TESSALON) 100 MG capsule Take 1 capsule (100 mg total) by mouth every 8 (eight) hours. 12/31/14   Terell Kincy, PA-C  LORazepam (ATIVAN) 1 MG tablet Take 1 tablet (1 mg total) by mouth daily as needed for anxiety. Patient not taking: Reported on 12/31/2014 11/06/14   [name], MD  naproxen (NAPROSYN) 250 MG tablet Take 1 tablet (250 mg total) by mouth 2 (two) times daily with a  meal. Patient not taking: Reported on 12/25/2014 12/10/14   Everlene Farrier, PA-C   BP 128/78 mmHg  Pulse 70  Temp(Src) 98.2 F (36.8 C) (Oral)  Resp 16  Ht  (1.956 m)  Wt 295 lb 11.2 oz (134.129 kg)  BMI 35.06 kg/m2  SpO2 99% Physical Exam  Constitutional: He is oriented to person, place, and time. He appears well-developed and well-nourished. No distress.  HENT:  Head: Normocephalic and atraumatic.  Right Ear: Hearing, tympanic membrane, external ear and ear canal normal.  Left Ear: Hearing, tympanic membrane, external ear and ear canal normal.  Nose: Rhinorrhea present.  Mouth/Throat: Oropharynx is clear and moist.  Eyes: Conjunctivae are normal.  Neck:  Normal range of motion. Neck supple. No JVD present.  No nuchal rigidity.   Cardiovascular: Normal rate.   Pulmonary/Chest: Effort normal. No respiratory distress. He exhibits tenderness.  Abdominal: Soft.  Musculoskeletal: Normal range of motion.  Neurological: He is alert and oriented to person, place, and time. Coordination normal.  Skin: Skin is warm and dry. No rash noted. He is not diaphoretic. No erythema. No pallor.  Psychiatric: He has a normal mood and affect. His behavior is normal.  Nursing note and vitals reviewed.   ED Course  Procedures  Medications  sodium chloride (OCEAN) 0.65 % nasal spray 1 spray (1 spray Each Nare Given 12/31/14 0338)    DIAGNOSTIC STUDIES: Oxygen Saturation is 99% on RA, normal by my interpretation.    COORDINATION OF CARE: 3:26 AM Discussed treatment plan with pt. Pt acknowledges and agrees to plan.   Labs Review Labs Reviewed  BASIC METABOLIC PANEL - Abnormal; Notable for the following:    Glucose, Bld 126 (*)    All other components within normal limits  CBC  I-STAT TROPOININ, ED    Imaging Review Dg Chest 2 View  12/31/2014   CLINICAL DATA:  34 year old male with chest pain  EXAM: CHEST  2 VIEW  COMPARISON:  Radiograph dated 12/04/2014  FINDINGS: The heart size and mediastinal contours are within normal limits. Both lungs are clear. The visualized skeletal structures are unremarkable.  IMPRESSION: No active cardiopulmonary disease.   Electronically Signed   By: Elgie Collard M.D.   On: 12/31/2014 02:02   I have personally reviewed and evaluated these images and lab results as part of my medical decision-making.   EKG Interpretation None      MDM   Final diagnoses:  URI (upper respiratory infection)  Chest pain, atypical    Filed Vitals:   12/31/14 0415  BP: 117/78  Pulse: 66  Temp:   Resp: 22   Patients symptoms are consistent with URI, likely viral etiology. No hypoxia or fever to suggest pneumonia. Lungs clear to  auscultation bilaterally. Reproducible chest wall tenderness. No nuchal rigidity or toxicities to suggest meningitis. Labs reviewed without acute abnormality. EKG unremarkable. Troponin negative. Discussed that antibiotics are not indicated for viral infections. Pt will be discharged with symptomatic treatment.  Patient verbalizes understanding and is agreeable with plan. Pt is hemodynamically stable at time of discharge.     I personally performed the services described in this documentation, which was scribed in my presence. The recorded information has been reviewed and is accurate.     Francee Piccolo, PA-C 12/31/14 1610  Tomasita Crumble, MD 12/31/14 2033

## 2014-12-31 NOTE — Discharge Instructions (Signed)
Please follow up with your primary care physician in 1-2 days. If you do not have one please call the [facility] number listed above. Please read all discharge instructions and return precautions.  ° °Upper Respiratory Infection, Adult °An upper respiratory infection (URI) is also sometimes known as the common cold. The upper respiratory tract includes the nose, sinuses, throat, trachea, and bronchi. Bronchi are the airways leading to the lungs. Most people improve within 1 week, but symptoms can last up to 2 weeks. A residual cough may last even longer.  °CAUSES °Many different viruses can infect the tissues lining the upper respiratory tract. The tissues become irritated and inflamed and often become very moist. Mucus production is also common. A cold is contagious. You can easily spread the virus to others by oral contact. This includes kissing, sharing a glass, coughing, or sneezing. Touching your mouth or nose and then touching a surface, which is then touched by another person, can also spread the virus. °SYMPTOMS  °Symptoms typically develop 1 to 3 days after you come in contact with a cold virus. Symptoms vary from person to person. They may include: °· Runny nose. °· Sneezing. °· Nasal congestion. °· Sinus irritation. °· Sore throat. °· Loss of voice (laryngitis). °· Cough. °· Fatigue. °· Muscle aches. °· Loss of appetite. °· Headache. °· Low-grade fever. °DIAGNOSIS  °You might diagnose your own cold based on familiar symptoms, since most people get a cold 2 to 3 times a year. Your caregiver can confirm this based on your exam. Most importantly, your caregiver can check that your symptoms are not due to another disease such as strep throat, sinusitis, pneumonia, asthma, or epiglottitis. Blood tests, throat tests, and X-rays are not necessary to diagnose a common cold, but they may sometimes be helpful in excluding other more serious diseases. Your caregiver will decide if any further  tests are required. °RISKS AND COMPLICATIONS  °You may be at risk for a more severe case of the common cold if you smoke cigarettes, have chronic heart disease (such as heart failure) or lung disease (such as asthma), or if you have a weakened immune system. The very young and very old are also at risk for more serious infections. Bacterial sinusitis, middle ear infections, and bacterial pneumonia can complicate the common cold. The common cold can worsen asthma and chronic obstructive pulmonary disease (COPD). Sometimes, these complications can require emergency medical care and may be life-threatening. °PREVENTION  °The best way to protect against getting a cold is to practice good hygiene. Avoid oral or hand contact with people with cold symptoms. Wash your hands often if contact occurs. There is no clear evidence that vitamin C, vitamin E, echinacea, or exercise reduces the chance of developing a cold. However, it is always recommended to get plenty of rest and practice good nutrition. °TREATMENT  °Treatment is directed at relieving symptoms. There is no cure. Antibiotics are not effective, because the infection is caused by a virus, not by bacteria. Treatment may include: °· Increased fluid intake. Sports drinks offer valuable electrolytes, sugars, and fluids. °· Breathing heated mist or steam (vaporizer or shower). °· Eating chicken soup or other clear broths, and maintaining good nutrition. °· Getting plenty of rest. °· Using gargles or lozenges for comfort. °· Controlling fevers with ibuprofen or acetaminophen as directed by your caregiver. °· Increasing usage of your inhaler if you have asthma. °Zinc gel and zinc lozenges, taken in the first 24 hours of the   common cold, can shorten the duration and lessen the severity of symptoms. Pain medicines may help with fever, muscle aches, and throat pain. A variety of non-prescription medicines are available to treat congestion and runny nose. Your caregiver can  make recommendations and may suggest nasal or lung inhalers for other symptoms.  °HOME CARE INSTRUCTIONS  °· Only take over-the-counter or prescription medicines for pain, discomfort, or fever as directed by your caregiver. °· Use a warm mist humidifier or inhale steam from a shower to increase air moisture. This may keep secretions moist and make it easier to breathe. °· Drink enough water and fluids to keep your urine clear or pale yellow. °· Rest as needed. °· Return to work when your temperature has returned to normal or as your caregiver advises. You may need to stay home longer to avoid infecting others. You can also use a face mask and careful hand washing to prevent spread of the virus. °SEEK MEDICAL CARE IF:  °· After the first few days, you feel you are getting worse rather than better. °· You need your caregiver's advice about medicines to control symptoms. °· You develop chills, worsening shortness of breath, or brown or red sputum. These may be signs of pneumonia. °· You develop yellow or brown nasal discharge or pain in the face, especially when you bend forward. These may be signs of sinusitis. °· You develop a fever, swollen neck glands, pain with swallowing, or white areas in the back of your throat. These may be signs of strep throat. °SEEK IMMEDIATE MEDICAL CARE IF:  °· You have a fever. °· You develop severe or persistent headache, ear pain, sinus pain, or chest pain. °· You develop wheezing, a prolonged cough, cough up blood, or have a change in your usual mucus (if you have chronic lung disease). °· You develop sore muscles or a stiff neck. °Document Released: 09/28/2000 Document Revised: 06/27/2011 Document Reviewed: 07/10/2013 °[employer]® Patient Information ©2015 [employer]. This information is not intended to replace advice given to you by your health care provider. Make sure you discuss any questions you have with your health care provider. ° °

## 2014-12-31 NOTE — ED Notes (Signed)
Patient here with complaint of recurrent left chest pain which radiates laterally and into the left shoulder. Reports history of similar. Also reports cough and congestion.

## 2015-01-05 ENCOUNTER — Telehealth: Payer: Self-pay | Admitting: General Practice

## 2015-01-05 NOTE — Telephone Encounter (Signed)
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 Patient states he is attempting to have his stamps increased. Patient states he is willing to discuss his eating arrangements if necessary. Please assist

## 2015-01-10 ENCOUNTER — Emergency Department (HOSPITAL_COMMUNITY): Payer: Self-pay

## 2015-01-10 ENCOUNTER — Emergency Department (HOSPITAL_COMMUNITY)
Admission: EM | Admit: 2015-01-10 | Discharge: 2015-01-11 | Discharge status: Against Medical Advice | Payer: Self-pay | Attending: Emergency Medicine | Admitting: Emergency Medicine

## 2015-01-10 ENCOUNTER — Encounter (HOSPITAL_COMMUNITY): Payer: Self-pay | Admitting: Oncology

## 2015-01-10 DIAGNOSIS — R079 Chest pain, unspecified: Secondary | ICD-10-CM | POA: Insufficient documentation

## 2015-01-10 LAB — CBC
HEMATOCRIT: 43.2 % (ref 39.0–52.0)
HEMOGLOBIN: 14.3 g/dL (ref 13.0–17.0)
MCH: 28.8 pg (ref 26.0–34.0)
MCHC: 33.1 g/dL (ref 30.0–36.0)
MCV: 86.9 fL (ref 78.0–100.0)
Platelets: 323 10*3/uL (ref 150–400)
RBC: 4.97 MIL/uL (ref 4.22–5.81)
RDW: 13.4 % (ref 11.5–15.5)
WBC: 10 10*3/uL (ref 4.0–10.5)

## 2015-01-10 LAB — BASIC METABOLIC PANEL
ANION GAP: 5 (ref 5–15)
BUN: 10 mg/dL (ref 6–20)
CHLORIDE: 105 mmol/L (ref 101–111)
CO2: 27 mmol/L (ref 22–32)
Calcium: 9.3 mg/dL (ref 8.9–10.3)
Creatinine, Ser: 1.16 mg/dL (ref 0.61–1.24)
GFR calc Af Amer: >60 mL/min (ref >60)
GFR calc non Af Amer: >60 mL/min (ref >60)
Glucose, Bld: 95 mg/dL (ref 65–99)
POTASSIUM: 4 mmol/L (ref 3.5–5.1)
SODIUM: 137 mmol/L (ref 135–145)

## 2015-01-10 LAB — I-STAT TROPONIN, ED: Troponin i, poc: 0.01 ng/mL (ref 0.00–0.08)

## 2015-01-10 NOTE — ED Notes (Signed)
Per pt he was playing basketball when he returned home pt said he developed diffuse chest pain 8/10 pressure in nature.  Pt appears to be in NAD.

## 2015-01-10 NOTE — ED Notes (Signed)
Pt called to roomed post triage with no response.  RN notified.

## 2015-01-11 NOTE — ED Notes (Signed)
Pt called post triage to be seen by doctor on the acute side.  Pt has not answered. RN notified.

## 2015-01-16 ENCOUNTER — Emergency Department (HOSPITAL_COMMUNITY): Payer: Self-pay

## 2015-01-16 ENCOUNTER — Emergency Department (HOSPITAL_COMMUNITY)
Admission: EM | Admit: 2015-01-16 | Discharge: 2015-01-16 | Disposition: A | Discharge status: Home / Self Care | Payer: Self-pay | Attending: Emergency Medicine | Admitting: Emergency Medicine

## 2015-01-16 ENCOUNTER — Encounter (HOSPITAL_COMMUNITY): Payer: Self-pay | Admitting: Family Medicine

## 2015-01-16 DIAGNOSIS — K219 Gastro-esophageal reflux disease without esophagitis: Secondary | ICD-10-CM | POA: Insufficient documentation

## 2015-01-16 DIAGNOSIS — R079 Chest pain, unspecified: Secondary | ICD-10-CM | POA: Insufficient documentation

## 2015-01-16 DIAGNOSIS — R61 Generalized hyperhidrosis: Secondary | ICD-10-CM | POA: Insufficient documentation

## 2015-01-16 DIAGNOSIS — F419 Anxiety disorder, unspecified: Secondary | ICD-10-CM | POA: Insufficient documentation

## 2015-01-16 DIAGNOSIS — Z7982 Long term (current) use of aspirin: Secondary | ICD-10-CM | POA: Insufficient documentation

## 2015-01-16 DIAGNOSIS — Z79899 Other long term (current) drug therapy: Secondary | ICD-10-CM | POA: Insufficient documentation

## 2015-01-16 DIAGNOSIS — Z87891 Personal history of nicotine dependence: Secondary | ICD-10-CM | POA: Insufficient documentation

## 2015-01-16 DIAGNOSIS — R0602 Shortness of breath: Secondary | ICD-10-CM | POA: Insufficient documentation

## 2015-01-16 LAB — CBC WITH DIFFERENTIAL/PLATELET
BASOS ABS: 0 10*3/uL (ref 0.0–0.1)
Basophils Relative: 1 %
Eosinophils Absolute: 0.2 10*3/uL (ref 0.0–0.7)
Eosinophils Relative: 4 %
HEMATOCRIT: 44.3 % (ref 39.0–52.0)
HEMOGLOBIN: 14.8 g/dL (ref 13.0–17.0)
LYMPHS ABS: 2.1 10*3/uL (ref 0.7–4.0)
LYMPHS PCT: 33 %
MCH: 28.7 pg (ref 26.0–34.0)
MCHC: 33.4 g/dL (ref 30.0–36.0)
MCV: 85.9 fL (ref 78.0–100.0)
Monocytes Absolute: 0.5 10*3/uL (ref 0.1–1.0)
Monocytes Relative: 8 %
NEUTROS ABS: 3.5 10*3/uL (ref 1.7–7.7)
NEUTROS PCT: 54 %
Platelets: 309 10*3/uL (ref 150–400)
RBC: 5.16 MIL/uL (ref 4.22–5.81)
RDW: 13 % (ref 11.5–15.5)
WBC: 6.3 10*3/uL (ref 4.0–10.5)

## 2015-01-16 LAB — BASIC METABOLIC PANEL
ANION GAP: 9 (ref 5–15)
BUN: 7 mg/dL (ref 6–20)
CHLORIDE: 104 mmol/L (ref 101–111)
CO2: 25 mmol/L (ref 22–32)
Calcium: 9.8 mg/dL (ref 8.9–10.3)
Creatinine, Ser: 1.16 mg/dL (ref 0.61–1.24)
GFR calc Af Amer: >60 mL/min (ref >60)
GFR calc non Af Amer: >60 mL/min (ref >60)
GLUCOSE: 95 mg/dL (ref 65–99)
POTASSIUM: 3.9 mmol/L (ref 3.5–5.1)
Sodium: 138 mmol/L (ref 135–145)

## 2015-01-16 LAB — I-STAT TROPONIN, ED
Troponin i, poc: 0.01 ng/mL (ref 0.00–0.08)
Troponin i, poc: 0 ng/mL (ref 0.00–0.08)

## 2015-01-16 MED ORDER — ASPIRIN EC 325 MG PO TBEC
325.0000 mg | DELAYED_RELEASE_TABLET | Freq: Once | ORAL | Status: AC
  Administered 2015-01-16: 325 mg via ORAL
  Filled 2015-01-16: qty 1

## 2015-01-16 MED ORDER — NITROGLYCERIN 0.4 MG SL SUBL
0.4000 mg | SUBLINGUAL_TABLET | SUBLINGUAL | Status: DC | PRN
  Filled 2015-01-16 (×2): qty 1

## 2015-01-16 MED ORDER — IOHEXOL 350 MG/ML SOLN
100.0000 mL | Freq: Once | INTRAVENOUS | Status: AC | PRN
  Administered 2015-01-16: 80 mL via INTRAVENOUS

## 2015-01-16 NOTE — ED Notes (Signed)
Pt sts left sided chest pain that has been constant since Sunday. sts some SOB that started today. Denies any cough, fever, heavy lifting. sts pain is there with sitting, moving and breathing.

## 2015-01-16 NOTE — Discharge Instructions (Signed)

## 2015-01-16 NOTE — ED Provider Notes (Signed)
Care assumed from Dr. Rush Landmark at about 3:45 PM. Please see his note for full history and physical. Briefly, 34 year old male with history of anxiety, depression, gastroparesis, reflux presenting with chest pain. Troponin negative 1, no EKG changes, CTA PE negative. Significant family history of cardiac disease in 66s. Plan to await second troponin and discharge if negative.  Second troponin negative. Patient feeling better. Discussed findings with him. Discharged in stable condition with outpatient follow-up and return precautions.  Case discussed with Dr. Juleen China.   Urban Gibson, MD 01/17/15 1610  Raeford Razor, MD 01/17/15 [phone]

## 2015-01-16 NOTE — ED Provider Notes (Signed)
CSN: [id number]     Arrival date & time 01/16/15  1140 History   First MD Initiated Contact with Patient 01/16/15 1205     Chief Complaint  Patient presents with  . Chest Pain     (Consider location/radiation/quality/duration/timing/severity/associated sxs/prior Treatment) Patient is a 34 y.o. male presenting with chest pain. The history is provided by the patient and medical records. No language interpreter was used.  Chest Pain Pain location:  L chest Pain quality: pressure and radiating   Pain quality: not sharp   Pain radiates to:  L jaw Pain radiates to the back: no   Pain severity:  Severe Onset quality:  Gradual Duration:  1 week Timing:  Constant Progression:  Waxing and waning Chronicity:  Recurrent Context: breathing and at rest   Context: no trauma   Relieved by:  Nothing Worsened by:  Deep breathing Ineffective treatments:  None tried Associated symptoms: diaphoresis and shortness of breath   Associated symptoms: no abdominal pain, no anxiety, no back pain, no cough, no dizziness, no fatigue, no fever, no headache, no lower extremity edema, no nausea, no palpitations, no syncope, not vomiting and no weakness   Shortness of breath:    Severity:  Moderate   Onset quality:  Gradual   Duration:  1 day   Timing:  Intermittent   Progression:  Waxing and waning Risk factors: male sex and obesity   Risk factors: no coronary artery disease, no diabetes mellitus and no smoking     Past Medical History  Diagnosis Date  . Depression   . Anxiety   . Gastroparesis   . Acid reflux    Past Surgical History  Procedure Laterality Date  . No past surgeries     Family History  Problem Relation Age of Onset  . Cancer Father   . Diabetes Father   . Heart attack Other    Social History  Substance Use Topics  . Smoking status: Former Smoker -- 0.00 packs/day    Types: Cigars    Quit date: 09/06/2014  . Smokeless tobacco: Never Used  . Alcohol Use: No    Review of  Systems  Constitutional: Positive for diaphoresis. Negative for fever, chills and fatigue.  HENT: Negative for congestion and rhinorrhea.   Respiratory: Positive for shortness of breath. Negative for cough, choking, chest tightness, wheezing and stridor.   Cardiovascular: Positive for chest pain. Negative for palpitations, leg swelling and syncope.  Gastrointestinal: Negative for nausea, vomiting, abdominal pain, diarrhea and constipation.  Genitourinary: Negative for flank pain.  Musculoskeletal: Negative for back pain, neck pain and neck stiffness.  Skin: Negative for rash and wound.  Neurological: Negative for dizziness, weakness and headaches.  Psychiatric/Behavioral: Negative for confusion and agitation.  All other systems reviewed and are negative.     Allergies  Other and Red dye  Home Medications   Prior to Admission medications   Medication Sig Start Date End Date Taking? Authorizing [place] Giammona  aspirin EC 81 MG tablet Take 81 mg by mouth every morning. 09/20/14 09/20/15  Historical Chong Wojdyla, MD  benzonatate (TESSALON) 100 MG capsule Take 1 capsule (100 mg total) by mouth every 8 (eight) hours. 12/31/14   Jennifer Piepenbrink, PA-C  busPIRone (BUSPAR) 10 MG tablet Take 1 tablet (10 mg total) by mouth 2 (two) times daily. Patient taking differently: Take 10 mg by mouth 3 (three) times daily.  12/25/14   [name], NP  cetirizine (ZYRTEC ALLERGY) 10 MG tablet Take 1 tablet (10 mg total)  by mouth daily. 12/10/14   Everlene Farrier, PA-C  lidocaine (LIDODERM) 5 % Place 1 patch onto the skin daily. Remove & Discard patch within 12 hours or as directed by MD Patient taking differently: Place 1 patch onto the skin daily as needed. Remove & Discard patch within 12 hours or as directed by MD 12/25/14   [name], NP  LORazepam (ATIVAN) 1 MG tablet Take 1 tablet (1 mg total) by mouth daily as needed for anxiety. Patient not taking: Reported on 12/31/2014 11/06/14   [name], MD    naproxen (NAPROSYN) 250 MG tablet Take 1 tablet (250 mg total) by mouth 2 (two) times daily with a meal. Patient not taking: Reported on 12/25/2014 12/10/14   Everlene Farrier, PA-C  pantoprazole (PROTONIX) 40 MG tablet Take 1 tablet (40 mg total) by mouth daily. 12/10/14   Everlene Farrier, PA-C  ranitidine (ZANTAC) 150 MG tablet Take 150 mg by mouth 2 (two) times daily.    Historical Rewa Weissberg, MD   BP 117/74 mmHg  Pulse 71  Temp(Src) 98.2 F (36.8 C)  Resp 18  SpO2 97% Physical Exam  Constitutional: He is oriented to person, place, and time. He appears well-developed. No distress.  HENT:  Head: Normocephalic and atraumatic.  Eyes: EOM are normal. Pupils are equal, round, and reactive to light.  Neck: Normal range of motion.  Cardiovascular: Normal rate, regular rhythm, normal heart sounds and intact distal pulses.   No murmur heard. Pulmonary/Chest: No stridor. No respiratory distress. He exhibits tenderness.    Abdominal: Soft. There is no tenderness. There is no rebound and no guarding.  Musculoskeletal: He exhibits no tenderness.  Neurological: He is alert and oriented to person, place, and time. He exhibits normal muscle tone.  Skin: He is diaphoretic. No erythema.  Psychiatric: He has a normal mood and affect.  Nursing note and vitals reviewed.   ED Course  Procedures (including critical care time) Labs Review Labs Reviewed  CBC WITH DIFFERENTIAL/PLATELET  BASIC METABOLIC PANEL  I-STAT TROPOININ, ED  Rosezena Sensor, ED    Imaging Review Dg Chest 2 View  01/16/2015   CLINICAL DATA:  Chest pain and shortness of breath for 5 days  EXAM: CHEST  2 VIEW  COMPARISON:  January 10, 2015  FINDINGS: The lungs are clear. The heart size and pulmonary vascularity are normal. No adenopathy. No pneumothorax. No bone lesions.  IMPRESSION: No edema or consolidation.   Electronically Signed   By: Bretta Bang III M.D.   On: 01/16/2015 13:51   Ct Angio Chest Pe W/cm &/or Wo  Cm  01/16/2015   CLINICAL DATA:  Left-sided chest pain, shortness of breath  EXAM: CT ANGIOGRAPHY CHEST WITH CONTRAST  TECHNIQUE: Multidetector CT imaging of the chest was performed using the standard protocol during bolus administration of intravenous contrast. Multiplanar CT image reconstructions and MIPs were obtained to evaluate the vascular anatomy.  CONTRAST:  80mL OMNIPAQUE IOHEXOL 350 MG/ML SOLN  COMPARISON:  Chest radiograph 01/16/2015, chest CT 11/20/2014  FINDINGS: Mediastinum/Nodes: Mild motion degradation of the chest at the level of the main pulmonary artery. Allowing for this, as well as early bolus timing, there is no filling defect up to the third order pulmonary arteries to suggest acute pulmonary embolism. Heart size is normal. No pericardial effusion. No lymphadenopathy.  Lungs/Pleura: Previously seen left upper lobe pulmonary nodule is no longer identified however there is motion degradation at this level. This could obscure a small nodule. No new pulmonary nodule, mass,  or consolidation. Central airways are patent. No pleural effusion.  Upper abdomen: Normal  Musculoskeletal: No acute osseous finding.  Review of the MIP images confirms the above findings.  IMPRESSION: No acute cardiopulmonary process. No evidence for pulmonary embolism up to the third order pulmonary arteries allowing for degradation of image quality due to motion and bolus timing.  The previously identified 4 mm left upper lobe pulmonary nodule is not seen on today's exam but could be obscured by motion. Given the patient's age, this is most likely an incidental finding and no specific imaging followup is acquired.   Electronically Signed   By: Christiana Pellant M.D.   On: 01/16/2015 14:10   I have personally reviewed and evaluated these images and lab results as part of my medical decision-making.   EKG Interpretation   Date/Time:  Friday January 16 2015 11:47:32 EDT Ventricular Rate:  69 PR Interval:  164 QRS  Duration: 92 QT Interval:  396 QTC Calculation: 424 R Axis:   66 Text Interpretation:  Normal sinus rhythm Normal ECG Confirmed by [facility]  MD, Barbara Cower [phone]) on 01/16/2015 12:18:19 PM      MDM   Luisa Dago is a 34 y.o. male With the past medical history significant for anxiety, GERD, and chest pain who presents with chest pain and shortness of breath. The patient reports that for the last six days, he has had pressure like chest pain in his left chest. The patient describes the pain being pleuritic, worse when he was playing basketball, also occurring in rest, radiating to his neck, constant but fluctuating, and associated with diaphoresis. The patient was diaphoretic on initial evaluation. The patient reports that he is not taking any medicine to help his symptoms yet. The patient was given aspirin, Nitroglycerin sublingually, and will have a workup to investigate for possible pulmonary embolism versus cardiac chest pain versus musculoskeletal chest pain or reflux.   On exam, the patient's lungs were clear to auscultation bilaterally however, the left chest was slightly tender to palpation. The patient was noticeably diaphoretic all over his body while resting comfortably in the exam room bed.  The patient's EKG did not show any evidence of ischemia and appeared similar prior.  The patient's diagnostic laboratory testing was performed and the CBC was unremarkable,The BMP was a remarkable, and his initial troponin was negative. Given the patient's pleuritic chest pain with associated shortness of breath and his diaphoresis, there is clinical suspicion for pulmonary embolism. The patient had a CTP study to investigate which did not reveal any embolism. There was also evidence of resolution of a prior pulmonary nodule. The chest x-ray showed no edema or consolidation.  Given the patient's chest pain, the patient will have a second troponin collected and analyzed. The patient's troponin is  negative times two, anticipate discharge with reassurance for noncardiac or pulmonary related chest pain. The patient only to follow up with his PCP for further management and may need a stress test due to the possible exertional component.  This patient's care of transfer to Dr. Cristi Loron at 3 PM. Please see her note for further workup and management of this patient in the ED. The patient's care of transferred in stable condition.  This patient was seen with Dr. Clayborne Dana, emergency medicine attending.    Final diagnoses:  Chest pain, unspecified chest pain type       Theda Belfast, MD 01/16/15 1750  Marily Memos, MD 01/18/15 [phone]

## 2015-01-20 NOTE — Telephone Encounter (Signed)
Patient called requesting a med refill on pantoprazole (PROTONIX) 40 MG tablet. Patient also requested he needs a doctors note for his food stamps stating he needs an increase due that his eating diet has changed. Please f/u with pt.

## 2015-02-10 NOTE — Telephone Encounter (Signed)
Please write patient a letter stating that he has severe acid reflux and now needs to eat more fruits and vegetables. Please explain to patient that I cannot write stating he needs a increase but can state his diet has changed. Please refill med

## 2015-03-03 ENCOUNTER — Telehealth: Payer: Self-pay | Admitting: Internal Medicine

## 2015-03-03 ENCOUNTER — Telehealth: Payer: Self-pay

## 2015-03-03 DIAGNOSIS — F419 Anxiety disorder, unspecified: Secondary | ICD-10-CM

## 2015-03-03 MED ORDER — BUSPIRONE HCL 10 MG PO TABS
10.0000 mg | ORAL_TABLET | Freq: Two times a day (BID) | ORAL | Status: DC
Start: 2015-03-03 — End: 2015-03-28

## 2015-03-03 NOTE — Telephone Encounter (Signed)
Pt really needs a refill on busPIRone (BUSPAR) 10 MG tablet for his anxiety. Patient made an appointment to see provider at first available day on 03/20/2015. Pt is in hopes to have medication refilled enough to get him to this appt. Can we do this for him? Sadie Reynolds, ASA

## 2015-03-03 NOTE — Telephone Encounter (Signed)
Tried to call patient to find out [facility] Left message on voice mail to return my call Rx for buspar sent to pharmacy on file

## 2015-03-19 ENCOUNTER — Ambulatory Visit: Payer: Self-pay | Admitting: Internal Medicine

## 2015-03-20 ENCOUNTER — Ambulatory Visit: Payer: Self-pay | Admitting: Internal Medicine

## 2015-03-27 ENCOUNTER — Encounter (HOSPITAL_COMMUNITY): Payer: Self-pay | Admitting: Emergency Medicine

## 2015-03-27 ENCOUNTER — Emergency Department (HOSPITAL_COMMUNITY)
Admission: EM | Admit: 2015-03-27 | Discharge: 2015-03-28 | Disposition: A | Discharge status: Home / Self Care | Payer: Self-pay | Attending: Emergency Medicine | Admitting: Emergency Medicine

## 2015-03-27 ENCOUNTER — Emergency Department (HOSPITAL_COMMUNITY): Payer: Self-pay

## 2015-03-27 DIAGNOSIS — F419 Anxiety disorder, unspecified: Secondary | ICD-10-CM | POA: Insufficient documentation

## 2015-03-27 DIAGNOSIS — Z7982 Long term (current) use of aspirin: Secondary | ICD-10-CM | POA: Insufficient documentation

## 2015-03-27 DIAGNOSIS — Z79899 Other long term (current) drug therapy: Secondary | ICD-10-CM | POA: Insufficient documentation

## 2015-03-27 DIAGNOSIS — K219 Gastro-esophageal reflux disease without esophagitis: Secondary | ICD-10-CM | POA: Insufficient documentation

## 2015-03-27 DIAGNOSIS — Z87891 Personal history of nicotine dependence: Secondary | ICD-10-CM | POA: Insufficient documentation

## 2015-03-27 DIAGNOSIS — R0789 Other chest pain: Secondary | ICD-10-CM | POA: Insufficient documentation

## 2015-03-27 MED ORDER — KETOROLAC TROMETHAMINE 60 MG/2ML IM SOLN
60.0000 mg | Freq: Once | INTRAMUSCULAR | Status: AC
  Administered 2015-03-27: 60 mg via INTRAMUSCULAR
  Filled 2015-03-27: qty 2

## 2015-03-27 NOTE — ED Provider Notes (Signed)
CSN: [id number]     Arrival date & time 03/27/15  2247 History   First MD Initiated Contact with Patient 03/27/15 2310     Chief Complaint  Patient presents with  . Chest Pain     (Consider location/radiation/quality/duration/timing/severity/associated sxs/prior Treatment) Patient is a 34 y.o. male presenting with chest pain. The history is provided by the patient.  Chest Pain He comes in with lower sternal chest pain which started early this evening. He describes it as a pressure feeling without radiation. There is no associated dyspnea, nausea, diaphoresis. It is worse if he moves since certain positions and better if he stays in other positions. He does have history of gastroesophageal reflux has had chest pain from that states that this feels different. He denies any fever, chills, cough. Is currently on pantoprazole for gastroesophageal reflux. He is not on anything to treat this pain at home. He is rating pain at 7/10. He is a nonsmoker and nondrinker and denies history of hypertension or diabetes or hyperlipidemia. There is a family history of premature coronary atherosclerosis.  Past Medical History  Diagnosis Date  . Depression   . Anxiety   . Gastroparesis   . Acid reflux    Past Surgical History  Procedure Laterality Date  . No past surgeries     Family History  Problem Relation Age of Onset  . Cancer Father   . Diabetes Father   . Heart attack Other    Social History  Substance Use Topics  . Smoking status: Former Smoker -- 0.00 packs/day    Types: Cigars    Quit date: 09/06/2014  . Smokeless tobacco: Never Used  . Alcohol Use: No    Review of Systems  Cardiovascular: Positive for chest pain.  All other systems reviewed and are negative.     Allergies  Other and Red dye  Home Medications   Prior to Admission medications   Medication Sig Start Date End Date Taking? Authorizing Provider  aspirin 81 MG tablet Take 81 mg by mouth daily.    Historical  Provider, MD  aspirin EC 81 MG tablet Take 81 mg by mouth every morning. 09/20/14 09/20/15  Historical Provider, MD  busPIRone (BUSPAR) 10 MG tablet Take 1 tablet (10 mg total) by mouth 2 (two) times daily. 03/03/15   Ambrose FinlandValerie A Keck, NP  cetirizine (ZYRTEC ALLERGY) 10 MG tablet Take 1 tablet (10 mg total) by mouth daily. 12/10/14   Everlene FarrierWilliam Dansie, PA-C  cetirizine (ZYRTEC) 10 MG tablet Take 10 mg by mouth daily.    Historical Provider, MD  lidocaine (LIDODERM) 5 % Place 1 patch onto the skin daily. Remove & Discard patch within 12 hours or as directed by MD Patient taking differently: Place 1 patch onto the skin daily as needed. Remove & Discard patch within 12 hours or as directed by MD 12/25/14   Ambrose FinlandValerie A Keck, NP  naproxen (NAPROSYN) 250 MG tablet Take 1 tablet (250 mg total) by mouth 2 (two) times daily with a meal. Patient not taking: Reported on 12/25/2014 12/10/14   Everlene FarrierWilliam Dansie, PA-C  pantoprazole (PROTONIX) 40 MG tablet Take 1 tablet (40 mg total) by mouth daily. Patient taking differently: Take 40 mg by mouth 2 (two) times daily.  12/10/14   Everlene FarrierWilliam Dansie, PA-C  ranitidine (ZANTAC) 150 MG tablet Take 150 mg by mouth 2 (two) times daily.    Historical Provider, MD   BP 121/85 mmHg  Pulse 78  Temp(Src) 98.1 F (36.7 C) (Oral)  SpO2 99% Physical Exam  Nursing note and vitals reviewed.  34 year old male, resting comfortably and in no acute distress. Vital signs are normal. Oxygen saturation is 99%, which is normal. Head is normocephalic and atraumatic. PERRLA, EOMI. Oropharynx is clear. Neck is nontender and supple without adenopathy or JVD. Back is nontender and there is no CVA tenderness. Lungs are clear without rales, wheezes, or rhonchi. Chest is mildly tender parasternal areas bilaterally which does reproduce his pain. Heart has regular rate and rhythm without murmur. Abdomen is soft, flat, nontender without masses or hepatosplenomegaly and peristalsis is normoactive. Extremities  have no cyanosis or edema, full range of motion is present. Skin is warm and dry without rash. Neurologic: Mental status is normal, cranial nerves are intact, there are no motor or sensory deficits.  ED Course  Procedures (including critical care time)  Imaging Review Dg Chest 2 View  03/27/2015  CLINICAL DATA:  Mid chest pain today. EXAM: CHEST  2 VIEW COMPARISON:  Radiographs and CT 01/16/2015 FINDINGS: The cardiomediastinal contours are normal. The lungs are clear. Pulmonary vasculature is normal. No consolidation, pleural effusion, or pneumothorax. No acute osseous abnormalities are seen. IMPRESSION: No acute pulmonary process. Electronically Signed   By: Rubye Oaks M.D.   On: 03/27/2015 23:38   I have personally reviewed and evaluated these images and lab results as part of my medical decision-making.   EKG Interpretation   Date/Time:  Friday March 27 2015 22:54:22 EST Ventricular Rate:  75 PR Interval:  156 QRS Duration: 97 QT Interval:  381 QTC Calculation: 425 R Axis:   61 Text Interpretation:  Sinus rhythm When compared with ECG of 01/16/2015, No  significant change was found Confirmed by [facility]  MD, Lauralyn Shadowens ([id number]) on  03/27/2015 11:11:00 PM      MDM   Final diagnoses:  Chest wall pain    Chest pain which appears to be chest wall pain. No red plaques to suggest more serious causes of pain. ECG is normal. Review of old records shows numerous ED visits for chest pain. He will be given a dose of ketorolac and chest x-ray obtained.  Chest x-ray is unremarkable. He had very little relief of pain with ketorolac. He is discharged with prescription for tramadol. He is requesting partial refill of prescription for buspirone because his prescription will run out before his next appointment which is in 1 week. Is given a prescription for 20 tablets.  Dione Booze, MD 03/28/15 [phone]

## 2015-03-27 NOTE — ED Notes (Signed)
Pt c/o central chest pressure onset today @1730  after showing, non radiating. Denies n/v/d. Pt states pain similar in the past with GERD

## 2015-03-28 MED ORDER — TRAMADOL HCL 50 MG PO TABS: 50.0000 mg | ORAL_TABLET | Freq: Four times a day (QID) | ORAL | Status: DC | PRN

## 2015-03-28 MED ORDER — TRAMADOL HCL 50 MG PO TABS
50.0000 mg | ORAL_TABLET | Freq: Once | ORAL | Status: AC
  Administered 2015-03-28: 50 mg via ORAL
  Filled 2015-03-28: qty 1

## 2015-03-28 MED ORDER — BUSPIRONE HCL 10 MG PO TABS: 10.0000 mg | ORAL_TABLET | Freq: Two times a day (BID) | ORAL | Status: DC

## 2015-03-28 NOTE — Discharge Instructions (Signed)
Chest Wall Pain Chest wall pain is pain in or around the bones and muscles of your chest. Sometimes, an injury causes this pain. Sometimes, the cause may not be known. This pain may take several weeks or longer to get better. HOME CARE INSTRUCTIONS  Pay attention to any changes in your symptoms. Take these actions to help with your pain:   Rest as told by your health care provider.   Avoid activities that cause pain. These include any activities that use your chest muscles or your abdominal and side muscles to lift heavy items.   If directed, apply ice to the painful area:  Put ice in a plastic bag.  Place a towel between your skin and the bag.  Leave the ice on for 20 minutes, 2-3 times per day.  Take over-the-counter and prescription medicines only as told by your health care provider.  Do not use tobacco products, including cigarettes, chewing tobacco, and e-cigarettes. If you need help quitting, ask your health care provider.  Keep all follow-up visits as told by your health care provider. This is important. SEEK MEDICAL CARE IF:  You have a fever.  Your chest pain becomes worse.  You have new symptoms. SEEK IMMEDIATE MEDICAL CARE IF:  You have nausea or vomiting.  You feel sweaty or light-headed.  You have a cough with phlegm (sputum) or you cough up blood.  You develop shortness of breath.   This information is not intended to replace advice given to you by your health care provider. Make sure you discuss any questions you have with your health care provider.   Document Released: 04/04/2005 Document Revised: 12/24/2014 Document Reviewed: 06/30/2014 Elsevier Interactive Patient Education 2016 Elsevier Inc.  Tramadol tablets What is this medicine? TRAMADOL (TRA ma dole) is a pain reliever. It is used to treat moderate to severe pain in adults. This medicine may be used for other purposes; ask your health care provider or pharmacist if you have questions. What  should I tell my health care provider before I take this medicine? They need to know if you have any of these conditions: -brain tumor -depression -drug abuse or addiction -head injury -if you frequently drink alcohol containing drinks -kidney disease or trouble passing urine -liver disease -lung disease, asthma, or breathing problems -seizures or epilepsy -suicidal thoughts, plans, or attempt; a previous suicide attempt by you or a family member -an unusual or allergic reaction to tramadol, codeine, other medicines, foods, dyes, or preservatives -pregnant or trying to get pregnant -breast-feeding How should I use this medicine? Take this medicine by mouth with a full glass of water. Follow the directions on the prescription label. If the medicine upsets your stomach, take it with food or milk. Do not take more medicine than you are told to take. Talk to your pediatrician regarding the use of this medicine in children. Special care may be needed. Overdosage: If you think you have taken too much of this medicine contact a poison control center or emergency room at once. NOTE: This medicine is only for you. Do not share this medicine with others. What if I miss a dose? If you miss a dose, take it as soon as you can. If it is almost time for your next dose, take only that dose. Do not take double or extra doses. What may interact with this medicine? Do not take this medicine with any of the following medications: -MAOIs like Carbex, Eldepryl, Marplan, Nardil, and Parnate This medicine may also interact  with the following medications: -alcohol or medicines that contain alcohol -antihistamines -benzodiazepines -bupropion -carbamazepine or oxcarbazepine -clozapine -cyclobenzaprine -digoxin -furazolidone -linezolid -medicines for depression, anxiety, or psychotic disturbances -medicines for migraine headache like almotriptan, eletriptan, frovatriptan, naratriptan, rizatriptan,  sumatriptan, zolmitriptan -medicines for pain like pentazocine, buprenorphine, butorphanol, meperidine, nalbuphine, and propoxyphene -medicines for sleep -muscle relaxants -naltrexone -phenobarbital -phenothiazines like perphenazine, thioridazine, chlorpromazine, mesoridazine, fluphenazine, prochlorperazine, promazine, and trifluoperazine -procarbazine -warfarin This list may not describe all possible interactions. Give your health care provider a list of all the medicines, herbs, non-prescription drugs, or dietary supplements you use. Also tell them if you smoke, drink alcohol, or use illegal drugs. Some items may interact with your medicine. What should I watch for while using this medicine? Tell your doctor or health care professional if your pain does not go away, if it gets worse, or if you have new or a different type of pain. You may develop tolerance to the medicine. Tolerance means that you will need a higher dose of the medicine for pain relief. Tolerance is normal and is expected if you take this medicine for a long time. Do not suddenly stop taking your medicine because you may develop a severe reaction. Your body becomes used to the medicine. This does NOT mean you are addicted. Addiction is a behavior related to getting and using a drug for a non-medical reason. If you have pain, you have a medical reason to take pain medicine. Your doctor will tell you how much medicine to take. If your doctor wants you to stop the medicine, the dose will be slowly lowered over time to avoid any side effects. You may get drowsy or dizzy. Do not drive, use machinery, or do anything that needs mental alertness until you know how this medicine affects you. Do not stand or sit up quickly, especially if you are an older patient. This reduces the risk of dizzy or fainting spells. Alcohol can increase or decrease the effects of this medicine. Avoid alcoholic drinks. You may have constipation. Try to have a  bowel movement at least every 2 to 3 days. If you do not have a bowel movement for 3 days, call your doctor or health care professional. Your mouth may get dry. Chewing sugarless gum or sucking hard candy, and drinking plenty of water may help. Contact your doctor if the problem does not go away or is severe. What side effects may I notice from receiving this medicine? Side effects that you should report to your doctor or health care professional as soon as possible: -allergic reactions like skin rash, itching or hives, swelling of the face, lips, or tongue -breathing difficulties, wheezing -confusion -itching -light headedness or fainting spells -redness, blistering, peeling or loosening of the skin, including inside the mouth -seizures Side effects that usually do not require medical attention (report to your doctor or health care professional if they continue or are bothersome): -constipation -dizziness -drowsiness -headache -nausea, vomiting This list may not describe all possible side effects. Call your doctor for medical advice about side effects. You may report side effects to FDA at 1-800-FDA-1088. Where should I keep my medicine? Keep out of the reach of children. This medicine may cause accidental overdose and death if it taken by other adults, children, or pets. Mix any unused medicine with a substance like cat litter or coffee grounds. Then throw the medicine away in a sealed container like a sealed bag or a coffee can with a lid. Do not use the medicine  after the expiration date. °Store at room temperature between 15 and 30 degrees C (59 and 86 degrees F). °NOTE: This sheet is a summary. It may not cover all possible information. If you have questions about this medicine, talk to your doctor, pharmacist, or health care provider. °  °© 2016, Elsevier/Gold Standard. (2013-05-31 15:42:09) ° °

## 2015-04-01 ENCOUNTER — Encounter (HOSPITAL_COMMUNITY): Payer: Self-pay | Admitting: Emergency Medicine

## 2015-04-01 ENCOUNTER — Emergency Department (HOSPITAL_COMMUNITY)
Admission: EM | Admit: 2015-04-01 | Discharge: 2015-04-01 | Disposition: A | Discharge status: Home / Self Care | Payer: Self-pay | Attending: Emergency Medicine | Admitting: Emergency Medicine

## 2015-04-01 DIAGNOSIS — Z202 Contact with and (suspected) exposure to infections with a predominantly sexual mode of transmission: Secondary | ICD-10-CM | POA: Insufficient documentation

## 2015-04-01 DIAGNOSIS — Z87891 Personal history of nicotine dependence: Secondary | ICD-10-CM | POA: Insufficient documentation

## 2015-04-01 DIAGNOSIS — F419 Anxiety disorder, unspecified: Secondary | ICD-10-CM | POA: Insufficient documentation

## 2015-04-01 DIAGNOSIS — Z79899 Other long term (current) drug therapy: Secondary | ICD-10-CM | POA: Insufficient documentation

## 2015-04-01 DIAGNOSIS — F329 Major depressive disorder, single episode, unspecified: Secondary | ICD-10-CM | POA: Insufficient documentation

## 2015-04-01 DIAGNOSIS — Z7982 Long term (current) use of aspirin: Secondary | ICD-10-CM | POA: Insufficient documentation

## 2015-04-01 DIAGNOSIS — K219 Gastro-esophageal reflux disease without esophagitis: Secondary | ICD-10-CM | POA: Insufficient documentation

## 2015-04-01 LAB — URINALYSIS, ROUTINE W REFLEX MICROSCOPIC
BILIRUBIN URINE: NEGATIVE
Glucose, UA: NEGATIVE mg/dL
HGB URINE DIPSTICK: NEGATIVE
Ketones, ur: NEGATIVE mg/dL
Nitrite: NEGATIVE
PROTEIN: NEGATIVE mg/dL
SPECIFIC GRAVITY, URINE: 1.018 (ref 1.005–1.030)
pH: 6 (ref 5.0–8.0)

## 2015-04-01 LAB — URINE MICROSCOPIC-ADD ON
Bacteria, UA: NONE SEEN
RBC / HPF: NONE SEEN RBC/hpf (ref 0–5)

## 2015-04-01 MED ORDER — AZITHROMYCIN 250 MG PO TABS
1000.0000 mg | ORAL_TABLET | Freq: Once | ORAL | Status: AC
  Administered 2015-04-01: 1000 mg via ORAL
  Filled 2015-04-01: qty 4

## 2015-04-01 MED ORDER — CEFTRIAXONE SODIUM 250 MG IJ SOLR
250.0000 mg | Freq: Once | INTRAMUSCULAR | Status: AC
  Administered 2015-04-01: 250 mg via INTRAMUSCULAR
  Filled 2015-04-01: qty 250

## 2015-04-01 MED ORDER — LIDOCAINE HCL (PF) 1 % IJ SOLN
0.9000 mL | Freq: Once | INTRAMUSCULAR | Status: AC
  Administered 2015-04-01: 0.9 mL via INTRADERMAL
  Filled 2015-04-01: qty 5

## 2015-04-01 MED ORDER — METRONIDAZOLE 500 MG PO TABS
2000.0000 mg | ORAL_TABLET | Freq: Once | ORAL | Status: AC
  Administered 2015-04-01: 2000 mg via ORAL
  Filled 2015-04-01: qty 4

## 2015-04-01 NOTE — ED Notes (Signed)
Pt states he received a call from a sexual partner stating he may have a STD.  Denies any symptoms.  Currently has 3 sexual partners.

## 2015-04-01 NOTE — ED Provider Notes (Signed)
CSN: [id number]     Arrival date & time 04/01/15  1655 History  By signing my name below, I, Evon Slack, attest that this documentation has been prepared under the direction and in the presence of Huma Imhoff, PA-C. Electronically Signed: Evon Slack, ED Scribe. 04/01/2015. 8:22 PM.    Chief Complaint  Patient presents with  . Exposure to STD    The history is provided by the patient. No language interpreter was used.   HPI Comments: Preston [name] is a 34 y.o. male who presents to the Emergency Department complaining of possible STD exposure. Pt states that one of his partners tested positive for trichomonas and now he wishes for entire STD testing. He endorses having 3 partners and does not regularly use condoms. He doesn't report any associated symptoms. Pt denies fever,chills, abdominal pain, nausea, vomiting, penile discharge, dysuria, penile pain, testicular pain or painful BMs.  Past Medical History  Diagnosis Date  . Depression   . Anxiety   . Gastroparesis   . Acid reflux    Past Surgical History  Procedure Laterality Date  . No past surgeries     Family History  Problem Relation Age of Onset  . Cancer Father   . Diabetes Father   . Heart attack Other    Social History  Substance Use Topics  . Smoking status: Former Smoker -- 0.00 packs/day    Types: Cigars    Quit date: 09/06/2014  . Smokeless tobacco: Never Used  . Alcohol Use: Yes    Review of Systems  Genitourinary: Negative for dysuria, frequency, hematuria, discharge, penile swelling, scrotal swelling, difficulty urinating, genital sores, penile pain and testicular pain.  All other systems reviewed and are negative.     Allergies  Other; Red dye; and Yellow dyes (non-tartrazine)  Home Medications   Prior to Admission medications   Medication Sig Start Date End Date Taking? Authorizing Provider  aspirin EC 81 MG tablet Take 81 mg by mouth every morning. 09/20/14 09/20/15  Historical  Provider, MD  busPIRone (BUSPAR) 10 MG tablet Take 1 tablet (10 mg total) by mouth 2 (two) times daily. 03/28/15   Dione Booze, MD  cetirizine (ZYRTEC ALLERGY) 10 MG tablet Take 1 tablet (10 mg total) by mouth daily. 12/10/14   Everlene Farrier, PA-C  pantoprazole (PROTONIX) 40 MG tablet Take 1 tablet (40 mg total) by mouth daily. Patient taking differently: Take 40 mg by mouth 2 (two) times daily.  12/10/14   Everlene Farrier, PA-C  ranitidine (ZANTAC) 150 MG tablet Take 150 mg by mouth at bedtime.     Historical Provider, MD  traMADol (ULTRAM) 50 MG tablet Take 1 tablet (50 mg total) by mouth every 6 (six) hours as needed. 03/28/15   Dione Booze, MD   BP 130/80 mmHg  Pulse 80  Temp(Src) 98 F (36.7 C) (Oral)  Resp 16  Ht  (1.956 m)  Wt 138.801 kg  BMI 36.28 kg/m2  SpO2 98%   Physical Exam  Constitutional: He is oriented to person, place, and time. He appears well-developed and well-nourished. No distress.  Nontoxic appearing, resting comfortably  HENT:  Head: Normocephalic and atraumatic.  Eyes: Conjunctivae and EOM are normal. Right eye exhibits no discharge. Left eye exhibits no discharge. No scleral icterus.  Neck: Normal range of motion. Neck supple. No tracheal deviation present.  Cardiovascular: Normal rate.   Pulmonary/Chest: Effort normal. No respiratory distress.  Abdominal: Soft. There is no tenderness.  Genitourinary: Testes normal and penis normal.  Normal male external genitalia  Musculoskeletal: Normal range of motion.  Neurological: He is alert and oriented to person, place, and time. Coordination normal.  Skin: Skin is warm and dry.  Psychiatric: He has a normal mood and affect. His behavior is normal.  Nursing note and vitals reviewed.   ED Course  Procedures (including critical care time) DIAGNOSTIC STUDIES: Oxygen Saturation is 99% on RA, normal by my interpretation.    COORDINATION OF CARE: 8:22 PM-Discussed treatment plan with pt at bedside and pt  agreed to plan.     Labs Review Labs Reviewed  URINALYSIS, ROUTINE W REFLEX MICROSCOPIC (NOT AT [facility]) - Abnormal; Notable for the following:    Leukocytes, UA TRACE (*)    All other components within normal limits  URINE MICROSCOPIC-ADD ON - Abnormal; Notable for the following:    Squamous Epithelial / LPF 0-5 (*)    All other components within normal limits  RPR  HIV ANTIBODY (ROUTINE TESTING)  GC/CHLAMYDIA PROBE AMP ([place]) NOT AT [facility]    Imaging Review No results found.    EKG Interpretation None      MDM   Final diagnoses:  STD exposure   34 year old male presenting for STD exposure after a partner tested positive for Trichomonas. He is asymptomatic at this time and wishes for tests and prophylactic therapy. He has no complaints at this time. STD cultures obtained including HIV, syphilis, gonorrhea and chlamydia. Patient has been treated prophylactically with flagyl, azithromycin and Rocephin. Discussed importance of using protection when sexually active. Pt understands that they have GC/Chlamydia cultures pending and that they will need to inform all sexual partners if results return positive. Return precautions given in discharge paperwork and discussed with pt at bedside. Pt stable for discharge  I personally performed the services described in this documentation, which was scribed in my presence. The recorded information has been reviewed and is accurate.      Alveta HeimlichStevi Windi Toro, PA-C 04/01/15 2023  Zadie Rhineonald Wickline, MD 04/01/15 [phone]

## 2015-04-01 NOTE — ED Notes (Signed)
Pt reports partner informing him she tested positive for trichomonas. Pt would like to be checked for STD's. Denies STI symptoms.

## 2015-04-01 NOTE — ED Provider Notes (Signed)
Preston [name] is a 34 y.o. male who presents c/o exposure to trich.  Pt with 3 male partners.  No fever, chills, Abd pain, N/V, testicular pain, dysuria.    General: Awake, alert  HEENT: Atraumatic  Resp: Normal effort  Abd: Nondistended  MSK: Moves all extremities  Skin: Warm and dry  MSE was initiated and I personally evaluated the patient and placed orders (if any) at  5:03 PM on April 01, 2015.    The patient appears stable so that the remainder of the MSE may be completed by another provider.  Discussed with the patient that exiting the department prior to completion of the work-up is AMA and there is no guarantee that there are no emergency medical conditions present.     Dahlia ClientHannah Ulices Maack, PA-C 04/01/15 1703  Cathren LaineKevin Steinl, MD 04/02/15 [phone]

## 2015-04-01 NOTE — Discharge Instructions (Signed)
Sexually Transmitted Disease °A sexually transmitted disease (STD) is a disease or infection that may be passed (transmitted) from person to person, usually during sexual activity. This may happen by way of saliva, semen, blood, vaginal mucus, or urine. Common STDs include: °· Gonorrhea. °· Chlamydia. °· Syphilis. °· HIV and AIDS. °· Genital herpes. °· Hepatitis B and C. °· Trichomonas. °· Human papillomavirus (HPV). °· Pubic lice. °· Scabies. °· Mites. °· Bacterial vaginosis. °WHAT ARE CAUSES OF STDs? °An STD may be caused by bacteria, a virus, or parasites. STDs are often transmitted during sexual activity if one person is infected. However, they may also be transmitted through nonsexual means. STDs may be transmitted after:  °· Sexual intercourse with an infected person. °· Sharing sex toys with an infected person. °· Sharing needles with an infected person or using unclean piercing or tattoo needles. °· Having intimate contact with the genitals, mouth, or rectal areas of an infected person. °· Exposure to infected fluids during birth. °WHAT ARE THE SIGNS AND SYMPTOMS OF STDs? °Different STDs have different symptoms. Some people may not have any symptoms. If symptoms are present, they may include: °· Painful or bloody urination. °· Pain in the pelvis, abdomen, vagina, anus, throat, or eyes. °· A skin rash, itching, or irritation. °· Growths, ulcerations, blisters, or sores in the genital and anal areas. °· Abnormal vaginal discharge with or without bad odor. °· Penile discharge in men. °· Fever. °· Pain or bleeding during sexual intercourse. °· Swollen glands in the groin area. °· Yellow skin and eyes (jaundice). This is seen with hepatitis. °· Swollen testicles. °· Infertility. °· Sores and blisters in the mouth. °HOW ARE STDs DIAGNOSED? °To make a diagnosis, your health care provider may: °· Take a medical history. °· Perform a physical exam. °· Take a sample of any discharge to examine. °· Swab the throat,  cervix, opening to the penis, rectum, or vagina for testing. °· Test a sample of your first morning urine. °· Perform blood tests. °· Perform a Pap test, if this applies. °· Perform a colposcopy. °· Perform a laparoscopy. °HOW ARE STDs TREATED? °Treatment depends on the STD. Some STDs may be treated but not cured. °· Chlamydia, gonorrhea, trichomonas, and syphilis can be cured with antibiotic medicine. °· Genital herpes, hepatitis, and HIV can be treated, but not cured, with prescribed medicines. The medicines lessen symptoms. °· Genital warts from HPV can be treated with medicine or by freezing, burning (electrocautery), or surgery. Warts may come back. °· HPV cannot be cured with medicine or surgery. However, abnormal areas may be removed from the cervix, vagina, or vulva. °· If your diagnosis is confirmed, your recent sexual partners need treatment. This is true even if they are symptom-free or have a negative culture or evaluation. They should not have sex until their health care providers say it is okay. °· Your health care provider may test you for infection again 3 months after treatment. °HOW CAN I REDUCE MY RISK OF GETTING AN STD? °Take these steps to reduce your risk of getting an STD: °· Use latex condoms, dental dams, and water-soluble lubricants during sexual activity. Do not use petroleum jelly or oils. °· Avoid having multiple sex partners. °· Do not have sex with someone who has other sex partners °· Do not have sex with anyone you do not know or who is at high risk for an STD. °· Avoid risky sex practices that can break your skin. °· Do not have sex   if you have open sores on your mouth or skin. °· Avoid drinking too much alcohol or taking illegal drugs. Alcohol and drugs can affect your judgment and put you in a vulnerable position. °· Avoid engaging in oral and anal sex acts. °· Get vaccinated for HPV and hepatitis. If you have not received these vaccines in the past, talk to your health care  provider about whether one or both might be right for you. °· If you are at risk of being infected with HIV, it is recommended that you take a prescription medicine daily to prevent HIV infection. This is called pre-exposure prophylaxis (PrEP). You are considered at risk if: °¨ You are a man who has sex with other men (MSM). °¨ You are a heterosexual man or woman and are sexually active with more than one partner. °¨ You take drugs by injection. °¨ You are sexually active with a partner who has HIV. °· Talk with your health care provider about whether you are at high risk of being infected with HIV. If you choose to begin PrEP, you should first be tested for HIV. You should then be tested every 3 months for as long as you are taking PrEP. °WHAT SHOULD I DO IF I THINK I HAVE AN STD? °· See your health care provider. °· Tell your sexual partner(s). They should be tested and treated for any STDs. °· Do not have sex until your health care provider says it is okay. °WHEN SHOULD I GET IMMEDIATE MEDICAL CARE? °Contact your health care provider right away if:  °· You have severe abdominal pain. °· You are a man and notice swelling or pain in your testicles. °· You are a woman and notice swelling or pain in your vagina. °  °This information is not intended to replace advice given to you by your health care provider. Make sure you discuss any questions you have with your health care provider. °  °Document Released: 06/25/2002 Document Revised: 04/25/2014 Document Reviewed: 10/23/2012 °Elsevier Interactive Patient Education ©2016 Elsevier Inc. ° °

## 2015-04-02 ENCOUNTER — Encounter: Payer: Self-pay | Admitting: Internal Medicine

## 2015-04-02 ENCOUNTER — Ambulatory Visit: Payer: Self-pay | Attending: Internal Medicine | Admitting: Internal Medicine

## 2015-04-02 VITALS — BP 127/79 | HR 73 | Temp 98.2°F | Resp 16 | Ht 77.0 in | Wt 309.6 lb

## 2015-04-02 DIAGNOSIS — F419 Anxiety disorder, unspecified: Secondary | ICD-10-CM | POA: Insufficient documentation

## 2015-04-02 DIAGNOSIS — Z7982 Long term (current) use of aspirin: Secondary | ICD-10-CM | POA: Insufficient documentation

## 2015-04-02 DIAGNOSIS — K3184 Gastroparesis: Secondary | ICD-10-CM | POA: Insufficient documentation

## 2015-04-02 DIAGNOSIS — Z87891 Personal history of nicotine dependence: Secondary | ICD-10-CM | POA: Insufficient documentation

## 2015-04-02 DIAGNOSIS — Z8249 Family history of ischemic heart disease and other diseases of the circulatory system: Secondary | ICD-10-CM | POA: Insufficient documentation

## 2015-04-02 DIAGNOSIS — Z9109 Other allergy status, other than to drugs and biological substances: Secondary | ICD-10-CM | POA: Insufficient documentation

## 2015-04-02 DIAGNOSIS — R079 Chest pain, unspecified: Secondary | ICD-10-CM | POA: Insufficient documentation

## 2015-04-02 DIAGNOSIS — F329 Major depressive disorder, single episode, unspecified: Secondary | ICD-10-CM | POA: Insufficient documentation

## 2015-04-02 DIAGNOSIS — K219 Gastro-esophageal reflux disease without esophagitis: Secondary | ICD-10-CM | POA: Insufficient documentation

## 2015-04-02 LAB — GC/CHLAMYDIA PROBE AMP (~~LOC~~) NOT AT ARMC
Chlamydia: NEGATIVE
Neisseria Gonorrhea: NEGATIVE

## 2015-04-02 LAB — RPR: RPR Ser Ql: NONREACTIVE

## 2015-04-02 LAB — HIV ANTIBODY (ROUTINE TESTING W REFLEX): HIV Screen 4th Generation wRfx: NONREACTIVE

## 2015-04-02 MED ORDER — BUSPIRONE HCL 10 MG PO TABS: 10.0000 mg | ORAL_TABLET | Freq: Two times a day (BID) | ORAL | Status: DC

## 2015-04-02 NOTE — Progress Notes (Signed)
Patient ID: Preston [name], male   DOB: 05/04/1980, 34 y.o.   MRN: [id number] CC:  anxiety  HPI: Preston [name] is a 34 y.o. male here today for a follow up visit for anxiety.  Patient has past medical history of chronic chest pain and GERD. Patient reports that he was unable to go back to [facility] because he has not had time to make a appointment with them. He has been taking Buspar daily and reports that if he skips it for 2 days or more he begins to feel anxious with chest pain. He is requesting medication refills today.  Patient has No headache, No chest pain, No abdominal pain - No Nausea, No new weakness tingling or numbness, No Cough - SOB.  Allergies  Allergen Reactions  . Other Swelling and Other (See Comments)    Gi cocktail "it makes me feel like my throat is closing up"  . Red Dye Hives and Itching  . Yellow Dyes (Non-Tartrazine) Hives and Itching   Past Medical History  Diagnosis Date  . Depression   . Anxiety   . Gastroparesis   . Acid reflux    Current Outpatient Prescriptions on File Prior to Visit  Medication Sig Dispense Refill  . aspirin EC 81 MG tablet Take 81 mg by mouth every morning.    . cetirizine (ZYRTEC ALLERGY) 10 MG tablet Take 1 tablet (10 mg total) by mouth daily. 30 tablet 1  . pantoprazole (PROTONIX) 40 MG tablet Take 1 tablet (40 mg total) by mouth daily. (Patient taking differently: Take 40 mg by mouth 2 (two) times daily. ) 30 tablet 1  . ranitidine (ZANTAC) 150 MG tablet Take 150 mg by mouth at bedtime.     . traMADol (ULTRAM) 50 MG tablet Take 1 tablet (50 mg total) by mouth every 6 (six) hours as needed. 15 tablet 0   No current facility-administered medications on file prior to visit.   Family History  Problem Relation Age of Onset  . Cancer Father   . Diabetes Father   . Heart attack Other    Social History   Social History  . Marital Status: Single    Spouse Name: N/A  . Number of Children: N/A  . Years of Education: N/A    Occupational History  . Not on file.   Social History Main Topics  . Smoking status: Former Smoker -- 0.00 packs/day    Types: Cigars    Quit date: 09/06/2014  . Smokeless tobacco: Never Used  . Alcohol Use: Yes  . Drug Use: 3.50 per week    Special: Marijuana     Comment: last use one month ago (10/07/14)  . Sexual Activity: Yes    Birth Control/ Protection: Condom   Other Topics Concern  . Not on file   Social History Narrative    Review of Systems: Other than what is stated in HPI, all other systems are negative.   Objective:   Filed Vitals:   04/02/15 1221  BP: 127/79  Pulse: 73  Temp: 98.2 F (36.8 C)  Resp: 16    Physical Exam  Constitutional: He is oriented to person, place, and time.  Cardiovascular: Normal rate, regular rhythm and normal heart sounds.   Pulmonary/Chest: Effort normal and breath sounds normal.  Neurological: He is alert and oriented to person, place, and time.  Skin: Skin is warm and dry.  Psychiatric: He has a normal mood and affect.     Lab Results  Component Value  Date   WBC 6.3 01/16/2015   HGB 14.8 01/16/2015   HCT 44.3 01/16/2015   MCV 85.9 01/16/2015   PLT 309 01/16/2015   Lab Results  Component Value Date   CREATININE 1.16 01/16/2015   BUN 7 01/16/2015   NA 138 01/16/2015   K 3.9 01/16/2015   CL 104 01/16/2015   CO2 25 01/16/2015    No results found for: HGBA1C Lipid Panel  No results found for: CHOL, TRIG, HDL, CHOLHDL, VLDL, LDLCALC     Assessment and plan:   Preston [name] was seen today for medication refill.  Diagnoses and all orders for this visit:  Anxiety -     busPIRone (BUSPAR) 10 MG tablet; Take 1 tablet (10 mg total) by mouth 2 (two) times daily. Stable, medication refilled. I have encouraged patient to follow up with [facility] to see if he needs any medication changes.  Return in about 6 months (around 10/01/2015).       Ambrose FinlandValerie A Keck, NP-C [facility] [phone] 04/02/2015, 3:38 PM

## 2015-04-02 NOTE — Progress Notes (Signed)
Patient here for medication refill. Patient needs a refill on buspar. Patient denies any pain today. Patient has taken his morning medications. Patient reports he has not followed up with [facility] due to not having time.

## 2015-04-15 ENCOUNTER — Encounter (HOSPITAL_COMMUNITY): Payer: Self-pay | Admitting: Emergency Medicine

## 2015-04-15 ENCOUNTER — Emergency Department (HOSPITAL_COMMUNITY)
Admission: EM | Admit: 2015-04-15 | Discharge: 2015-04-15 | Disposition: A | Discharge status: Home / Self Care | Payer: Self-pay | Attending: Emergency Medicine | Admitting: Emergency Medicine

## 2015-04-15 DIAGNOSIS — F419 Anxiety disorder, unspecified: Secondary | ICD-10-CM | POA: Insufficient documentation

## 2015-04-15 DIAGNOSIS — Z87891 Personal history of nicotine dependence: Secondary | ICD-10-CM | POA: Insufficient documentation

## 2015-04-15 DIAGNOSIS — Z7982 Long term (current) use of aspirin: Secondary | ICD-10-CM | POA: Insufficient documentation

## 2015-04-15 DIAGNOSIS — R369 Urethral discharge, unspecified: Secondary | ICD-10-CM | POA: Insufficient documentation

## 2015-04-15 DIAGNOSIS — K219 Gastro-esophageal reflux disease without esophagitis: Secondary | ICD-10-CM | POA: Insufficient documentation

## 2015-04-15 DIAGNOSIS — Z79899 Other long term (current) drug therapy: Secondary | ICD-10-CM | POA: Insufficient documentation

## 2015-04-15 DIAGNOSIS — Z202 Contact with and (suspected) exposure to infections with a predominantly sexual mode of transmission: Secondary | ICD-10-CM | POA: Insufficient documentation

## 2015-04-15 DIAGNOSIS — F329 Major depressive disorder, single episode, unspecified: Secondary | ICD-10-CM | POA: Insufficient documentation

## 2015-04-15 MED ORDER — LIDOCAINE HCL 1 % IJ SOLN
INTRAMUSCULAR | Status: AC
  Administered 2015-04-15: 20 mL
  Filled 2015-04-15: qty 20

## 2015-04-15 MED ORDER — CEFTRIAXONE SODIUM 250 MG IJ SOLR
250.0000 mg | Freq: Once | INTRAMUSCULAR | Status: AC
  Administered 2015-04-15: 250 mg via INTRAMUSCULAR
  Filled 2015-04-15: qty 250

## 2015-04-15 MED ORDER — AZITHROMYCIN 250 MG PO TABS
1000.0000 mg | ORAL_TABLET | Freq: Once | ORAL | Status: AC
  Administered 2015-04-15: 1000 mg via ORAL
  Filled 2015-04-15: qty 4

## 2015-04-15 MED ORDER — METRONIDAZOLE 500 MG PO TABS: 500.0000 mg | ORAL_TABLET | Freq: Two times a day (BID) | ORAL | Status: DC

## 2015-04-15 NOTE — ED Notes (Signed)
Pt comes to ed with c/o of penal discharge he noticed last night. States his partner told him she was tested and he should go get treated as well. V/s 140/97, pulse 86, temp 98.7, rr 18.

## 2015-04-15 NOTE — Discharge Instructions (Signed)
Please read and follow all provided instructions.  Your diagnoses today include:  1. Penile discharge   2. Exposure to STD     Tests performed today include:  Test for gonorrhea and chlamydia.   Test for HIV and syphilis.   You will be notified by telephone with any positive results.   Vital signs. See below for your results today.   Medications:  You were treated with azithromycin and rocephin today. These antibiotics treat you for gonorrhea and chlamydia. They do not treat for HIV or syphilis.    Metronidazole - antibiotic  You have been prescribed an antibiotic medicine: take the entire course of medicine even if you are feeling better. Stopping early can cause the antibiotic not to work. Do not drink alcohol when taking this medication.   Home care instructions:  Read educational materials contained in this packet and follow any instructions provided.   You should tell your partners about your infection and avoid having sex for one week to allow time for the medicine to work.  Sexually transmitted disease testing also available at:   [facility] [place] [place]  [address], [place], phone [phone] or [phone]    Monday - Friday, call for an appointment  [facility] [place] [place] [facility]  [address], [place], phone [phone] or [phone]   Monday - Friday, call for an appointment  Return instructions:   Please return to the Emergency Department if you experience worsening symptoms.   Please return if you have any other emergent concerns.  Additional Information:  Your vital signs today were: BP 140/97 mmHg   Pulse 86   Temp(Src) 98.7 F (37.1 C) (Oral)   SpO2 100% If your blood pressure (BP) was elevated above 135/85 this visit, please have this repeated by your doctor within one month. --------------

## 2015-04-15 NOTE — ED Provider Notes (Signed)
History  By signing my name below, I, Karle PlumberJennifer Tensley, attest that this documentation has been prepared under the direction and in the presence of Josh Alyne Martinson, PA-C. Electronically Signed: Karle PlumberJennifer Tensley, ED Scribe. 04/15/2015. 12:53 PM.  Chief Complaint  Patient presents with  . Penile Discharge   The history is provided by the patient and medical records. No language interpreter was used.    HPI Comments:  Preston [name] is a 34 y.o. male who presents to the Emergency Department complaining of a watery, penile discharge that began last night. He states his partner told him she was recently diagnosed with trichomonas. He has not done anything to treat the symptoms. He denies modifying factors. He denies dysuria, urinary frequency, fever, chills, sore throat or abdominal pain.  Past Medical History  Diagnosis Date  . Depression   . Anxiety   . Gastroparesis   . Acid reflux    Past Surgical History  Procedure Laterality Date  . No past surgeries     Family History  Problem Relation Age of Onset  . Cancer Father   . Diabetes Father   . Heart attack Other    Social History  Substance Use Topics  . Smoking status: Former Smoker -- 0.00 packs/day    Types: Cigars    Quit date: 09/06/2014  . Smokeless tobacco: Never Used  . Alcohol Use: Yes    Review of Systems  Constitutional: Negative for fever and chills.  HENT: Negative for sore throat.   Eyes: Negative for discharge.  Gastrointestinal: Negative for abdominal pain and rectal pain.  Genitourinary: Positive for discharge. Negative for dysuria, frequency, genital sores, penile pain and testicular pain.  Musculoskeletal: Negative for arthralgias.  Skin: Negative for rash.  Hematological: Negative for adenopathy.    Allergies  Other; Red dye; and Yellow dyes (non-tartrazine)  Home Medications   Prior to Admission medications   Medication Sig Start Date End Date Taking? Authorizing Provider  aspirin EC 81 MG  tablet Take 81 mg by mouth every morning. 09/20/14 09/20/15  Historical Provider, MD  busPIRone (BUSPAR) 10 MG tablet Take 1 tablet (10 mg total) by mouth 2 (two) times daily. 04/02/15   Ambrose FinlandValerie A Keck, NP  cetirizine (ZYRTEC ALLERGY) 10 MG tablet Take 1 tablet (10 mg total) by mouth daily. 12/10/14   Everlene FarrierWilliam Dansie, PA-C  pantoprazole (PROTONIX) 40 MG tablet Take 1 tablet (40 mg total) by mouth daily. Patient taking differently: Take 40 mg by mouth 2 (two) times daily.  12/10/14   Everlene FarrierWilliam Dansie, PA-C  ranitidine (ZANTAC) 150 MG tablet Take 150 mg by mouth at bedtime.     Historical Provider, MD  traMADol (ULTRAM) 50 MG tablet Take 1 tablet (50 mg total) by mouth every 6 (six) hours as needed. 03/28/15   Dione Boozeavid Glick, MD   Triage Vitals: BP 140/97 mmHg  Pulse 86  Temp(Src) 98.7 F (37.1 C) (Oral)  SpO2 100% Physical Exam  Constitutional: He appears well-developed and well-nourished.  HENT:  Head: Normocephalic and atraumatic.  Eyes: Conjunctivae and EOM are normal.  Neck: Normal range of motion. Neck supple.  Cardiovascular: Normal rate.   Pulmonary/Chest: Effort normal. No respiratory distress.  Genitourinary: Penis normal. Right testis shows no mass and no tenderness. Left testis shows no mass and no tenderness. Circumcised. No penile tenderness. No discharge found.  Musculoskeletal: Normal range of motion.  Lymphadenopathy:       Right: No inguinal adenopathy present.       Left: No inguinal adenopathy  present.  Neurological: He is alert.  Skin: Skin is warm and dry.  Psychiatric: He has a normal mood and affect. His behavior is normal.  Nursing note and vitals reviewed.   ED Course  Procedures (including critical care time) DIAGNOSTIC STUDIES: Oxygen Saturation is 100% on RA, normal by my interpretation.   COORDINATION OF CARE: 12:52 PM- Will treat for trichomonas. Will check and treat for GC and chlamydia. Will also check for HIV and syphilis. Pt verbalizes understanding and  agrees to plan.  Medications - No data to display  Labs Review Labs Reviewed - No data to display  Imaging Review No results found. I have personally reviewed and evaluated these images and lab results as part of my medical decision-making.   EKG Interpretation None      1:25 PM Patient seen and examined. Work-up initiated. Medications ordered.   Vital signs reviewed and are as follows: BP 129/91 mmHg  Pulse 68  Temp(Src) 98.7 F (37.1 C) (Oral)  Resp 18  SpO2 96%  Patient counseled on safe sexual practices. Told them that they should not have sexual contact for next 7 days and that they need to inform sexual partners so that they can get tested and treated as well. Patient verbalizes understanding and agrees with plan.     MDM   Final diagnoses:  Penile discharge  Exposure to STD   Patient with STD exposure. Prescription for metronidazole given for trichomonas exposure. Patient tested and treated for gonorrhea and chlamydia. Testing sent for HIV and syphilis.  I personally performed the services described in this documentation, which was scribed in my presence. The recorded information has been reviewed and is accurate.     Renne Crigler, PA-C 04/15/15 1326  Donnetta Hutching, MD 04/16/15 [phone]

## 2015-04-18 LAB — GC/CHLAMYDIA PROBE AMP (~~LOC~~) NOT AT ARMC
CHLAMYDIA, DNA PROBE: NEGATIVE
NEISSERIA GONORRHEA: NEGATIVE

## 2015-05-01 MED FILL — ?BUSPIRONE HCL 10 MG TABLET: 10 | 30 days supply | Qty: 60 | Fill #1

## 2015-05-27 MED FILL — ?BUSPIRONE HCL 10 MG TABLET: 10 | 30 days supply | Qty: 60 | Fill #2

## 2015-06-23 MED FILL — ?BUSPIRONE HCL 10 MG TABLET: 10 | 30 days supply | Qty: 60 | Fill #3

## 2015-06-24 ENCOUNTER — Emergency Department (HOSPITAL_COMMUNITY)
Admission: EM | Admit: 2015-06-24 | Discharge: 2015-06-24 | Disposition: A | Discharge status: Home / Self Care | Payer: Self-pay | Attending: Emergency Medicine | Admitting: Emergency Medicine

## 2015-06-24 ENCOUNTER — Emergency Department (HOSPITAL_COMMUNITY): Payer: Self-pay

## 2015-06-24 ENCOUNTER — Encounter (HOSPITAL_COMMUNITY): Payer: Self-pay | Admitting: Emergency Medicine

## 2015-06-24 DIAGNOSIS — Y9389 Activity, other specified: Secondary | ICD-10-CM | POA: Insufficient documentation

## 2015-06-24 DIAGNOSIS — K219 Gastro-esophageal reflux disease without esophagitis: Secondary | ICD-10-CM | POA: Insufficient documentation

## 2015-06-24 DIAGNOSIS — Z79899 Other long term (current) drug therapy: Secondary | ICD-10-CM | POA: Insufficient documentation

## 2015-06-24 DIAGNOSIS — F419 Anxiety disorder, unspecified: Secondary | ICD-10-CM | POA: Insufficient documentation

## 2015-06-24 DIAGNOSIS — F329 Major depressive disorder, single episode, unspecified: Secondary | ICD-10-CM | POA: Insufficient documentation

## 2015-06-24 DIAGNOSIS — Z792 Long term (current) use of antibiotics: Secondary | ICD-10-CM | POA: Insufficient documentation

## 2015-06-24 DIAGNOSIS — Y990 Civilian activity done for income or pay: Secondary | ICD-10-CM | POA: Insufficient documentation

## 2015-06-24 DIAGNOSIS — W208XXA Other cause of strike by thrown, projected or falling object, initial encounter: Secondary | ICD-10-CM | POA: Insufficient documentation

## 2015-06-24 DIAGNOSIS — S9032XA Contusion of left foot, initial encounter: Secondary | ICD-10-CM | POA: Insufficient documentation

## 2015-06-24 DIAGNOSIS — Z87891 Personal history of nicotine dependence: Secondary | ICD-10-CM | POA: Insufficient documentation

## 2015-06-24 DIAGNOSIS — Y9289 Other specified places as the place of occurrence of the external cause: Secondary | ICD-10-CM | POA: Insufficient documentation

## 2015-06-24 MED ORDER — IBUPROFEN 400 MG PO TABS
800.0000 mg | ORAL_TABLET | Freq: Once | ORAL | Status: AC
  Administered 2015-06-24: 800 mg via ORAL
  Filled 2015-06-24: qty 2

## 2015-06-24 MED ORDER — IBUPROFEN 800 MG PO TABS: 800.0000 mg | ORAL_TABLET | Freq: Three times a day (TID) | ORAL | Status: DC

## 2015-06-24 MED ORDER — ACETAMINOPHEN 325 MG PO TABS
650.0000 mg | ORAL_TABLET | Freq: Once | ORAL | Status: AC
  Administered 2015-06-24: 650 mg via ORAL
  Filled 2015-06-24: qty 2

## 2015-06-24 NOTE — ED Provider Notes (Signed)
CSN: [id number]     Arrival date & time 06/24/15  1711 History  By signing my name below, I, Iona BeardChristian Pulliam, attest that this documentation has been prepared under the direction and in the presence of Cheri FowlerKayla Rachal Dvorsky, PA-C.  Electronically Signed: Iona Beardhristian Pulliam, ED Scribe 06/24/2015 at 6:26 PM.    Chief Complaint  Patient presents with  . Ankle Pain    The history is provided by the patient. No language interpreter was used.   HPI Comments: Preston [name] is a 35 y.o. male who presents to the Emergency Department complaining of sudden onset, constant pain, left ankle pain after dropping clorox bottles on his ankle at work one day ago. No worsening or alleviating factors noted. Pt denies color change, numbness in foot, weakness to the area, difficulty ambulating, or any other pertinent symptoms.    Past Medical History  Diagnosis Date  . Depression   . Anxiety   . Gastroparesis   . Acid reflux    Past Surgical History  Procedure Laterality Date  . No past surgeries     Family History  Problem Relation Age of Onset  . Cancer Father   . Diabetes Father   . Heart attack Other    Social History  Substance Use Topics  . Smoking status: Former Smoker -- 0.00 packs/day    Types: Cigars    Quit date: 09/06/2014  . Smokeless tobacco: Never Used  . Alcohol Use: Yes    Review of Systems  Musculoskeletal: Positive for arthralgias. Negative for gait problem.       Left ankle pain.  Neurological: Negative for weakness and numbness.  All other systems reviewed and are negative.     Allergies  Other; Red dye; and Yellow dyes (non-tartrazine)  Home Medications   Prior to Admission medications   Medication Sig Start Date End Date Taking? Authorizing Provider  aspirin EC 81 MG tablet Take 81 mg by mouth every morning. 09/20/14 09/20/15  Historical Provider, MD  busPIRone (BUSPAR) 10 MG tablet Take 1 tablet (10 mg total) by mouth 2 (two) times daily. 04/02/15   Ambrose FinlandValerie A Keck, NP   cetirizine (ZYRTEC ALLERGY) 10 MG tablet Take 1 tablet (10 mg total) by mouth daily. 12/10/14   Everlene FarrierWilliam Dansie, PA-C  metroNIDAZOLE (FLAGYL) 500 MG tablet Take 1 tablet (500 mg total) by mouth 2 (two) times daily. 04/15/15   Renne CriglerJoshua Geiple, PA-C  pantoprazole (PROTONIX) 40 MG tablet Take 1 tablet (40 mg total) by mouth daily. Patient taking differently: Take 40 mg by mouth 2 (two) times daily.  12/10/14   Everlene FarrierWilliam Dansie, PA-C  ranitidine (ZANTAC) 150 MG tablet Take 150 mg by mouth at bedtime.     Historical Provider, MD  traMADol (ULTRAM) 50 MG tablet Take 1 tablet (50 mg total) by mouth every 6 (six) hours as needed. 03/28/15   Dione Boozeavid Glick, MD   BP 121/82 mmHg  Pulse 77  Temp(Src) 98.1 F (36.7 C) (Oral)  Resp 18  SpO2 98% Physical Exam  Constitutional: He is oriented to person, place, and time. He appears well-developed and well-nourished.  Non-toxic appearance. He does not have a sickly appearance. He does not appear ill.  HENT:  Head: Normocephalic and atraumatic.  Mouth/Throat: Oropharynx is clear and moist.  Eyes: Conjunctivae are normal.  Neck: Normal range of motion. Neck supple.  Cardiovascular: Intact distal pulses.   No murmur heard. Pulses:      Dorsalis pedis pulses are 2+ on the right side, and 2+  on the left side.  Pulmonary/Chest: Effort normal. No accessory muscle usage or stridor. He has no rhonchi.  Abdominal: He exhibits no distension.  Musculoskeletal: Normal range of motion. He exhibits tenderness.  Left ankle minimally tender to palpation along medial aspect.  No swelling, ecchymosis, bruising, erythema, or abrasions.  Compartments soft and compressible. FAROM in dorsi/plantar flexion.  Able to move all toes without difficulty.  5/5 strength.  Sensation intact to light touch.   Neurological: He is alert and oriented to person, place, and time.  Speech clear without dysarthria.  Strength and sensation intact.  Gait normal.   Skin: Skin is warm and dry.   Psychiatric: He has a normal mood and affect. His behavior is normal.    ED Course  Procedures (including critical care time) DIAGNOSTIC STUDIES: Oxygen Saturation is 98% on RA, normal by my interpretation.    COORDINATION OF CARE: 5:47 PM-Discussed treatment plan which includes ibuprofen and DG ankle complete left with pt at bedside and pt agreed to plan.    Labs Review Labs Reviewed - No data to display  Imaging Review Dg Ankle Complete Left  06/24/2015  CLINICAL DATA:  Sudden onset medial left ankle pain after dropping Clorox bottle on it 1 day ago EXAM: LEFT ANKLE COMPLETE - 3+ VIEW COMPARISON:  None. FINDINGS: There is no evidence of fracture, dislocation, or joint effusion. Chronic fragment adjacent to the medial malleolus consistent with remote trauma. There is no evidence of arthropathy or other focal bone abnormality. Soft tissues are unremarkable. IMPRESSION: Negative. Electronically Signed   By: Kennith Center M.D.   On: 06/24/2015 18:23   I have personally reviewed and evaluated these images as part of my medical decision-making.   EKG Interpretation None      MDM   Final diagnoses:  Foot contusion, left, initial encounter   Patient with left foot contusion.  VSS, NAD.  Compartments soft and compressible.  NVI.  FAROM.  Ambulatory.  Plain films negative.  Patient given ibuprofen and tylenol in ED.  ACE bandage applied.  Recommend RICE and ibuprofen for pain.  Follow up PCP in 1 week.  Discussed return precautions including increased swelling with associated numbness, uncontrolled pain, loss of function of left ankle.  Patient agrees and acknowledges the above plan for discharge.   I personally performed the services described in this documentation, which was scribed in my presence. The recorded information has been reviewed and is accurate.    Cheri Fowler, PA-C 06/24/15 1610  Zadie Rhine, MD 06/25/15 [phone]

## 2015-06-24 NOTE — Discharge Instructions (Signed)

## 2015-06-24 NOTE — ED Notes (Signed)
Patient able to ambulate independently  

## 2015-06-24 NOTE — ED Notes (Signed)
Pt presents to ED after dropping a box of chlorox bottles on his left ankle yesterday at work.  Pt states pain worsened today and is having difficulty walking on it.  Patient able to ambulate independently to room.  NAD.  No swelling or deformity noted to ankle

## 2015-07-17 MED FILL — ?BUSPIRONE HCL 10 MG TABLET: 10 | 30 days supply | Qty: 60 | Fill #4

## 2015-07-20 MED FILL — raNITIdine HCL 150 MG TABS: 150 | 30 days supply | Qty: 30 | Fill #3

## 2015-08-14 ENCOUNTER — Encounter (HOSPITAL_COMMUNITY): Payer: Self-pay

## 2015-08-14 ENCOUNTER — Emergency Department (HOSPITAL_COMMUNITY)
Admission: EM | Admit: 2015-08-14 | Discharge: 2015-08-14 | Disposition: A | Discharge status: Home / Self Care | Payer: Self-pay | Attending: Emergency Medicine | Admitting: Emergency Medicine

## 2015-08-14 DIAGNOSIS — Z760 Encounter for issue of repeat prescription: Secondary | ICD-10-CM | POA: Insufficient documentation

## 2015-08-14 DIAGNOSIS — K219 Gastro-esophageal reflux disease without esophagitis: Secondary | ICD-10-CM | POA: Insufficient documentation

## 2015-08-14 DIAGNOSIS — Z79899 Other long term (current) drug therapy: Secondary | ICD-10-CM | POA: Insufficient documentation

## 2015-08-14 DIAGNOSIS — Z792 Long term (current) use of antibiotics: Secondary | ICD-10-CM | POA: Insufficient documentation

## 2015-08-14 DIAGNOSIS — F419 Anxiety disorder, unspecified: Secondary | ICD-10-CM | POA: Insufficient documentation

## 2015-08-14 DIAGNOSIS — Z87891 Personal history of nicotine dependence: Secondary | ICD-10-CM | POA: Insufficient documentation

## 2015-08-14 DIAGNOSIS — Z7982 Long term (current) use of aspirin: Secondary | ICD-10-CM | POA: Insufficient documentation

## 2015-08-14 MED ORDER — BUSPIRONE HCL 10 MG PO TABS: 10.0000 mg | ORAL_TABLET | Freq: Three times a day (TID) | ORAL | Status: DC

## 2015-08-14 MED FILL — ?BUSPIRONE HCL 10 MG TABLET: 10 | 30 days supply | Qty: 60 | Fill #5

## 2015-08-14 NOTE — Discharge Instructions (Signed)
Use your Buspar as directed, follow up with your regular doctor in 1 week for ongoing medical care and future refills.    Medicine Refill at the Emergency Department We have refilled your medicine today, but it is best for you to get refills through your primary health care provider's office. In the future, please plan ahead so you do not need to get refills from the emergency department. If the medicine we refilled was a maintenance medicine, you may have received only enough to get you by until you are able to see your regular health care provider.   This information is not intended to replace advice given to you by your health care provider. Make sure you discuss any questions you have with your health care provider.   Document Released: 07/22/2003 Document Revised: 04/25/2014 Document Reviewed: 07/12/2013 Elsevier Interactive Patient Education [employer]2016 Elsevier Inc.

## 2015-08-14 NOTE — ED Notes (Signed)
Pt here for medication refill of Buspar, last dose today at 1830. Denies anxiety or SI.

## 2015-08-14 NOTE — ED Provider Notes (Signed)
CSN: [id number]     Arrival date & time 08/14/15  2004 History  By signing my name below, I, Tanda RockersMargaux Venter, attest that this documentation has been prepared under the direction and in the presence of [id number]Chuck Caban, [employer]PA-C. Electronically Signed: Tanda RockersMargaux Venter, ED Scribe. 08/14/2015. 8:30 PM.   Chief Complaint  Patient presents with  . Medication Refill   The history is provided by the patient. No language interpreter was used.     HPI Comments: Preston [name] is a 35 y.o. male with PMHx Depression and Anxiety, who presents to the Emergency Department for medication refill of 10 mg Buspar. Pt does have a PCP who prescribes the Buspar. He has a prescription ready at the pharmacy but was not able to get to the pharmacy prior to them closing and they will not be open until Monday (3 days from now). His last dose was at 6:15 PM today (approximately 2 hours ago). Pt is prescribed 10 mg Buspar BID but takes it TID to allow him to sleep, he was told by his PCP that he could increase it to TID for this reason. He is requesting enough medication to make it through until he can get his prescription filled, including the dose he would need at midnight and the doses for Sat/Sun (7 tabs total). Denies SI, HI, auditory or visual hallucinations, fever, chills, chest pain, shortness of breath, abdominal pain, nausea, vomiting, diarrhea, constipation, dysuria, hematuria, body aches, weakness, numbness, tingling, or any other associated symptoms. No EtOH or illicit drug use. Pt is former smoker, quit in May 2016.   Past Medical History  Diagnosis Date  . Depression   . Anxiety   . Gastroparesis   . Acid reflux    Past Surgical History  Procedure Laterality Date  . No past surgeries     Family History  Problem Relation Age of Onset  . Cancer Father   . Diabetes Father   . Heart attack Other    Social History  Substance Use Topics  . Smoking status: Former Smoker -- 0.00 packs/day    Types: Cigars    Quit date: 09/06/2014  . Smokeless tobacco: Never Used  . Alcohol Use: Yes    Review of Systems  Constitutional: Negative for fever and chills.  Respiratory: Negative for shortness of breath.   Cardiovascular: Negative for chest pain.  Gastrointestinal: Negative for nausea, vomiting, abdominal pain, diarrhea and constipation.  Genitourinary: Negative for dysuria and hematuria.  Musculoskeletal: Negative for myalgias and arthralgias.  Skin: Negative for rash.  Allergic/Immunologic: Negative for immunocompromised state.  Neurological: Negative for weakness and numbness.  Psychiatric/Behavioral: Negative for suicidal ideas, hallucinations and self-injury. The patient is not nervous/anxious.    A complete 10 system review of systems was obtained and all systems are negative except as noted in the HPI and PMH.    Allergies  Other; Red dye; and Yellow dyes (non-tartrazine)  Home Medications   Prior to Admission medications   Medication Sig Start Date End Date Taking? Authorizing Provider  aspirin EC 81 MG tablet Take 81 mg by mouth every morning. 09/20/14 09/20/15  Historical Provider, MD  busPIRone (BUSPAR) 10 MG tablet Take 1 tablet (10 mg total) by mouth 2 (two) times daily. 04/02/15   Ambrose FinlandValerie A Keck, NP  cetirizine (ZYRTEC ALLERGY) 10 MG tablet Take 1 tablet (10 mg total) by mouth daily. 12/10/14   Everlene FarrierWilliam Dansie, PA-C  ibuprofen (ADVIL,MOTRIN) 800 MG tablet Take 1 tablet (800 mg total) by mouth 3 (  three) times daily. 06/24/15   Cheri Fowler, PA-C  metroNIDAZOLE (FLAGYL) 500 MG tablet Take 1 tablet (500 mg total) by mouth 2 (two) times daily. 04/15/15   Renne Crigler, PA-C  pantoprazole (PROTONIX) 40 MG tablet Take 1 tablet (40 mg total) by mouth daily. Patient taking differently: Take 40 mg by mouth 2 (two) times daily.  12/10/14   Everlene Farrier, PA-C  ranitidine (ZANTAC) 150 MG tablet Take 150 mg by mouth at bedtime.     Historical Provider, MD  traMADol (ULTRAM) 50 MG tablet Take 1 tablet  (50 mg total) by mouth every 6 (six) hours as needed. 03/28/15   Dione Booze, MD   BP 126/89 mmHg  Pulse 75  Temp(Src) 98 F (36.7 C) (Oral)  Resp 16  SpO2 94%   Physical Exam  Constitutional: He is oriented to person, place, and time. Vital signs are normal. He appears well-developed and well-nourished.  Non-toxic appearance. No distress.  Afebrile, nontoxic, NAD  HENT:  Head: Normocephalic and atraumatic.  Mouth/Throat: Mucous membranes are normal.  Eyes: Conjunctivae and EOM are normal. Right eye exhibits no discharge. Left eye exhibits no discharge.  Neck: Normal range of motion. Neck supple.  Cardiovascular: Normal rate.   Pulmonary/Chest: Effort normal. No respiratory distress.  Abdominal: Normal appearance. He exhibits no distension.  Musculoskeletal: Normal range of motion.  Neurological: He is alert and oriented to person, place, and time. He has normal strength. No sensory deficit.  Skin: Skin is warm, dry and intact. No rash noted.  Psychiatric: He has a normal mood and affect.  Nursing note and vitals reviewed.   ED Course  Procedures (including critical care time)  DIAGNOSTIC STUDIES: Oxygen Saturation is 94% on RA, adequate by my interpretation.    COORDINATION OF CARE: 8:30 PM-Discussed treatment plan which includes prescription for 7 tablets Buspar with pt at bedside and pt agreed to plan.   Labs Review Labs Reviewed - No data to display  Imaging Review No results found.   EKG Interpretation None      MDM   Final diagnoses:  Encounter for medication refill    35 y.o. male here for med refill to get him to Monday when he can get his refill from the pharmacy, only needs enough to get him to Monday. No SI/HI/AVH, no complaints at this time. Will give buspar  #7 tabs which would be for tonight's dose and Sat/Sun doses. F/up with PCP in 1wk for ongoing medical care and refills. I explained the diagnosis and have given explicit precautions to return  to the ER including for any other new or worsening symptoms. The patient understands and accepts the medical plan as it's been dictated and I have answered their questions. Discharge instructions concerning home care and prescriptions have been given. The patient is STABLE and is discharged to home in good condition.   I personally performed the services described in this documentation, which was scribed in my presence. The recorded information has been reviewed and is accurate.  BP 126/89 mmHg  Pulse 75  Temp(Src) 98 F (36.7 C) (Oral)  Resp 16  SpO2 94%  Meds ordered this encounter  Medications  . busPIRone (BUSPAR) 10 MG tablet    Sig: Take 1 tablet (10 mg total) by mouth 3 (three) times daily.    Dispense:  7 tablet    Refill:  0    Order Specific Question:  Supervising Provider    Answer:  Eber Hong [[id number]]  Hadleigh Felber Camprubi-Soms, PA-C 08/14/15 2033  Blane Ohara, MD 08/15/15 [phone]

## 2015-08-22 ENCOUNTER — Emergency Department (HOSPITAL_COMMUNITY)
Admission: EM | Admit: 2015-08-22 | Discharge: 2015-08-22 | Disposition: A | Discharge status: Home / Self Care | Payer: Self-pay | Attending: Emergency Medicine | Admitting: Emergency Medicine

## 2015-08-22 ENCOUNTER — Emergency Department (HOSPITAL_COMMUNITY): Payer: Self-pay

## 2015-08-22 ENCOUNTER — Encounter (HOSPITAL_COMMUNITY): Payer: Self-pay | Admitting: Emergency Medicine

## 2015-08-22 DIAGNOSIS — Z87891 Personal history of nicotine dependence: Secondary | ICD-10-CM | POA: Insufficient documentation

## 2015-08-22 DIAGNOSIS — R079 Chest pain, unspecified: Secondary | ICD-10-CM | POA: Insufficient documentation

## 2015-08-22 DIAGNOSIS — Z791 Long term (current) use of non-steroidal anti-inflammatories (NSAID): Secondary | ICD-10-CM | POA: Insufficient documentation

## 2015-08-22 DIAGNOSIS — K219 Gastro-esophageal reflux disease without esophagitis: Secondary | ICD-10-CM | POA: Insufficient documentation

## 2015-08-22 DIAGNOSIS — Z7982 Long term (current) use of aspirin: Secondary | ICD-10-CM | POA: Insufficient documentation

## 2015-08-22 DIAGNOSIS — Z79899 Other long term (current) drug therapy: Secondary | ICD-10-CM | POA: Insufficient documentation

## 2015-08-22 DIAGNOSIS — Z792 Long term (current) use of antibiotics: Secondary | ICD-10-CM | POA: Insufficient documentation

## 2015-08-22 DIAGNOSIS — F419 Anxiety disorder, unspecified: Secondary | ICD-10-CM | POA: Insufficient documentation

## 2015-08-22 DIAGNOSIS — F329 Major depressive disorder, single episode, unspecified: Secondary | ICD-10-CM | POA: Insufficient documentation

## 2015-08-22 LAB — I-STAT TROPONIN, ED: Troponin i, poc: 0 ng/mL (ref 0.00–0.08)

## 2015-08-22 MED ORDER — SUCRALFATE 1 GM/10ML PO SUSP
1.0000 g | ORAL | Status: AC
  Administered 2015-08-22: 1 g via ORAL
  Filled 2015-08-22: qty 10

## 2015-08-22 MED ORDER — PANTOPRAZOLE SODIUM 40 MG PO TBEC: 40.0000 mg | DELAYED_RELEASE_TABLET | Freq: Two times a day (BID) | ORAL | Status: DC

## 2015-08-22 MED ORDER — ALUM & MAG HYDROXIDE-SIMETH 200-200-20 MG/5ML PO SUSP
30.0000 mL | Freq: Once | ORAL | Status: AC
  Administered 2015-08-22: 30 mL via ORAL
  Filled 2015-08-22: qty 30

## 2015-08-22 MED ORDER — GI COCKTAIL ~~LOC~~
30.0000 mL | Freq: Once | ORAL | Status: DC
  Filled 2015-08-22: qty 30

## 2015-08-22 NOTE — ED Notes (Signed)
Pt c/o L sided chest pain, tight in nature, non-radiating. Per pt, pain began while out at a party tonight. Pt was smoking black & milds and drinking champagne, walking outside when pain started. Pt has hx of GERD and HTN. Denies any rec drug use, and reports having 2 beers, 2 glasses of champagne and 4 black and milds last night.

## 2015-08-22 NOTE — Discharge Instructions (Signed)

## 2015-08-22 NOTE — ED Provider Notes (Signed)
CSN: [id number]     Arrival date & time 08/22/15  0301 History  By signing my name below, I, Arianna Nassar, attest that this documentation has been prepared under the direction and in the presence of Azalia BilisKevin Rogerick Baldwin, MD. Electronically Signed: Octavia HeirArianna Nassar, ED Scribe. 08/22/2015. 3:31 AM.    Chief Complaint  Patient presents with  . Chest Pain  . Anxiety      The history is provided by the patient. No language interpreter was used.   HPI Comments: Preston [name] is a 35 y.o. male who has a PMHx of depression, anxiety, gastroparesis an GERD presents to the Emergency Department complaining of constant, sudden onset, gradual worsening, moderate, non-radiating central chest tightness onset this evening. Pt reports he was going out this evening when he drank a couple of beer, champagne and smoked two black and milds. He states he drank some sprite to try and alleviate his symptoms with no relief. He has a paternal medical hx of CAD.  Past Medical History  Diagnosis Date  . Depression   . Anxiety   . Gastroparesis   . Acid reflux    Past Surgical History  Procedure Laterality Date  . No past surgeries     Family History  Problem Relation Age of Onset  . Cancer Father   . Diabetes Father   . Heart attack Other    Social History  Substance Use Topics  . Smoking status: Former Smoker -- 0.00 packs/day    Types: Cigars    Quit date: 09/06/2014  . Smokeless tobacco: Never Used  . Alcohol Use: Yes    Review of Systems  A complete 10 system review of systems was obtained and all systems are negative except as noted in the HPI and PMH.    Allergies  Other; Red dye; and Yellow dyes (non-tartrazine)  Home Medications   Prior to Admission medications   Medication Sig Start Date End Date Taking? Authorizing Provider  aspirin EC 81 MG tablet Take 81 mg by mouth every morning. 09/20/14 09/20/15  Historical Provider, MD  busPIRone (BUSPAR) 10 MG tablet Take 1 tablet (10 mg total) by mouth  2 (two) times daily. 04/02/15   Ambrose FinlandValerie A Keck, NP  busPIRone (BUSPAR) 10 MG tablet Take 1 tablet (10 mg total) by mouth 3 (three) times daily. 08/14/15   Mercedes Camprubi-Soms, PA-C  cetirizine (ZYRTEC ALLERGY) 10 MG tablet Take 1 tablet (10 mg total) by mouth daily. 12/10/14   Everlene FarrierWilliam Dansie, PA-C  ibuprofen (ADVIL,MOTRIN) 800 MG tablet Take 1 tablet (800 mg total) by mouth 3 (three) times daily. 06/24/15   Cheri FowlerKayla Rose, PA-C  metroNIDAZOLE (FLAGYL) 500 MG tablet Take 1 tablet (500 mg total) by mouth 2 (two) times daily. 04/15/15   Renne CriglerJoshua Geiple, PA-C  pantoprazole (PROTONIX) 40 MG tablet Take 1 tablet (40 mg total) by mouth daily. Patient taking differently: Take 40 mg by mouth 2 (two) times daily.  12/10/14   Everlene FarrierWilliam Dansie, PA-C  ranitidine (ZANTAC) 150 MG tablet Take 150 mg by mouth at bedtime.     Historical Provider, MD  traMADol (ULTRAM) 50 MG tablet Take 1 tablet (50 mg total) by mouth every 6 (six) hours as needed. 03/28/15   Dione Boozeavid Glick, MD   Triage vitals: BP 131/90 mmHg  Pulse 86  Temp(Src) 98.1 F (36.7 C) (Oral)  Resp 21  SpO2 97% Physical Exam  Constitutional: He is oriented to person, place, and time. He appears well-developed and well-nourished.  HENT:  Head: Normocephalic  and atraumatic.  Eyes: EOM are normal.  Neck: Normal range of motion.  Cardiovascular: Normal rate, regular rhythm, normal heart sounds and intact distal pulses.   Pulmonary/Chest: Effort normal and breath sounds normal. No respiratory distress.  Abdominal: Soft. He exhibits no distension. There is no tenderness.  Musculoskeletal: Normal range of motion.  Neurological: He is alert and oriented to person, place, and time.  Skin: Skin is warm and dry.  Psychiatric: He has a normal mood and affect. Judgment normal.  Nursing note and vitals reviewed.   ED Course  Procedures  DIAGNOSTIC STUDIES: Oxygen Saturation is 97% on RA, normal by my interpretation.  COORDINATION OF CARE:  3:29 AM Will order CXR.  Discussed treatment plan which includes lab work and pain medication with pt at bedside and pt agreed to plan.  Labs Review Labs Reviewed  I-STAT CHEM 8, ED  Rosezena Sensor, ED    Imaging Review Dg Chest 2 View  08/22/2015  CLINICAL DATA:  Chest pain and shortness of breath today.  Smoker. EXAM: CHEST  2 VIEW COMPARISON:  03/27/2015 FINDINGS: The heart size and mediastinal contours are within normal limits. Both lungs are clear. The visualized skeletal structures are unremarkable. IMPRESSION: No active cardiopulmonary disease. Electronically Signed   By: Burman Nieves M.D.   On: 08/22/2015 04:24   I have personally reviewed and evaluated these images and lab results as part of my medical decision-making.  ECG interpretation   Date: 08/24/2015  Rate: 87  Rhythm: normal sinus rhythm  QRS Axis: normal  Intervals: normal  ST/T Wave abnormalities: normal  Conduction Disutrbances: none  Narrative Interpretation:   Old EKG Reviewed: No significant changes noted     MDM   Final diagnoses:  None    Overall patient is well-appearing.  Feels much better after GI cocktail.  Likely gastroesophageal reflux disease.  Troponin negative.  Doubt ACS.  Doubt PE.  Well-appearing.  Chest x-ray clear.  Primary care follow-up.  He understands to return to the ER for new or worsening symptoms   I personally performed the services described in this documentation, which was scribed in my presence. The recorded information has been reviewed and is accurate.      Azalia Bilis, MD 08/24/15 [phone]

## 2015-08-22 NOTE — ED Notes (Signed)
Taken to X-Ray.

## 2015-09-09 ENCOUNTER — Telehealth: Payer: Self-pay | Admitting: Internal Medicine

## 2015-09-09 DIAGNOSIS — F419 Anxiety disorder, unspecified: Secondary | ICD-10-CM

## 2015-09-09 MED ORDER — BUSPIRONE HCL 10 MG PO TABS: 10.0000 mg | ORAL_TABLET | Freq: Two times a day (BID) | ORAL | Status: DC

## 2015-09-09 MED FILL — ?BUSPIRONE HCL 10 MG TABLET: 10 | 30 days supply | Qty: 60 | Fill #0

## 2015-09-09 NOTE — Telephone Encounter (Signed)
Refilled x 30 days - patient must have office visit for further refills. 

## 2015-09-09 NOTE — Telephone Encounter (Signed)
Patient called requesting buspirone. Please follow up.

## 2015-09-22 MED FILL — raNITIdine HCL 150 MG TABS: 150 | 30 days supply | Qty: 30 | Fill #4

## 2015-09-30 ENCOUNTER — Encounter: Payer: Self-pay | Admitting: Family Medicine

## 2015-09-30 ENCOUNTER — Ambulatory Visit: Payer: Self-pay | Attending: Family Medicine | Admitting: Family Medicine

## 2015-09-30 VITALS — BP 131/87 | HR 78 | Temp 97.7°F | Resp 14 | Ht 77.0 in | Wt 314.6 lb

## 2015-09-30 DIAGNOSIS — Z6837 Body mass index (BMI) 37.0-37.9, adult: Secondary | ICD-10-CM | POA: Insufficient documentation

## 2015-09-30 DIAGNOSIS — E669 Obesity, unspecified: Secondary | ICD-10-CM | POA: Insufficient documentation

## 2015-09-30 DIAGNOSIS — Z79899 Other long term (current) drug therapy: Secondary | ICD-10-CM | POA: Insufficient documentation

## 2015-09-30 DIAGNOSIS — F419 Anxiety disorder, unspecified: Secondary | ICD-10-CM | POA: Insufficient documentation

## 2015-09-30 DIAGNOSIS — Z13228 Encounter for screening for other metabolic disorders: Secondary | ICD-10-CM

## 2015-09-30 MED ORDER — BUSPIRONE HCL 10 MG PO TABS: 10.0000 mg | ORAL_TABLET | Freq: Two times a day (BID) | ORAL | Status: DC

## 2015-09-30 MED FILL — busPIRone HCL 10 MG TABS: 10 | 30 days supply | Qty: 60 | Fill #0

## 2015-09-30 NOTE — Progress Notes (Signed)
   Subjective:  Patient ID: Preston [name], male    DOB: [birth year]  Age: 35 y.o. MRN: [id number]  CC: Anxiety   HPI Preston [name] is 35 year old male with a history of anxiety who comes into the clinic for follow-up visit and to establish care with me as he was previously followed by the nurse practitioner who is no longer with the practice.  He states his anxiety is controlled on BuSpar and denies depression or suicidal ideations or intents. He has no other complaints today.  Outpatient Prescriptions Prior to Visit  Medication Sig Dispense Refill  . cetirizine (ZYRTEC ALLERGY) 10 MG tablet Take 1 tablet (10 mg total) by mouth daily. 30 tablet 1  . pantoprazole (PROTONIX) 40 MG tablet Take 1 tablet (40 mg total) by mouth 2 (two) times daily. 30 tablet 1  . busPIRone (BUSPAR) 10 MG tablet Take 1 tablet (10 mg total) by mouth 2 (two) times daily. Must have OV for refills 60 tablet 0   No facility-administered medications prior to visit.    ROS Review of Systems  Constitutional: Negative for activity change and appetite change.  HENT: Negative for sinus pressure and sore throat.   Respiratory: Negative for chest tightness, shortness of breath and wheezing.   Cardiovascular: Negative for chest pain and palpitations.  Gastrointestinal: Negative for abdominal pain, constipation and abdominal distention.  Genitourinary: Negative.   Musculoskeletal: Negative.   Psychiatric/Behavioral: Negative for behavioral problems and dysphoric mood.    Objective:  BP 131/87 mmHg  Pulse 78  Temp(Src) 97.7 F (36.5 C) (Oral)  Resp 14  Ht 6\' 5"  (1.956 m)  Wt 314 lb 9.6 oz (142.702 kg)  BMI 37.30 kg/m2  SpO2 97%  BP/Weight 09/30/2015 08/22/2015 08/14/2015  Systolic BP 131 113 126  Diastolic BP 87 84 89  Wt. (Lbs) 314.6 - -  BMI 37.3 - -      Physical Exam  Constitutional: He is oriented to person, place, and time. He appears well-developed and well-nourished.  obese  Cardiovascular:  Normal rate, normal heart sounds and intact distal pulses.   No murmur heard. Pulmonary/Chest: Effort normal and breath sounds normal. He has no wheezes. He has no rales. He exhibits no tenderness.  Abdominal: Soft. Bowel sounds are normal. He exhibits no distension and no mass. There is no tenderness.  Musculoskeletal: Normal range of motion.  Neurological: He is alert and oriented to person, place, and time.     Assessment & Plan:   1. Anxiety Controlled - busPIRone (BUSPAR) 10 MG tablet; Take 1 tablet (10 mg total) by mouth 2 (two) times daily. Must have OV for refills  Dispense: 60 tablet; Refill: 5  2. Screening for metabolic disorder - COMPLETE METABOLIC PANEL WITH GFR; Future - Lipid panel; Future  3. Obesity BMI is 37.4 Discussed reducing portion sizes, increasing physical activity   Meds ordered this encounter  Medications  . busPIRone (BUSPAR) 10 MG tablet    Sig: Take 1 tablet (10 mg total) by mouth 2 (two) times daily. Must have OV for refills    Dispense:  60 tablet    Refill:  5    Follow-up: Return in about 6 months (around 03/31/2016) for follow up of anxiety.   Jaclyn ShaggyEnobong Amao MD

## 2015-09-30 NOTE — Progress Notes (Signed)
Pt here for anxiety. Pt denies pain today. Pt needs refills on buspirone.

## 2015-09-30 NOTE — Patient Instructions (Signed)
Generalized Anxiety Disorder Generalized anxiety disorder (GAD) is a mental disorder. It interferes with life functions, including relationships, work, and school. GAD is different from normal anxiety, which everyone experiences at some point in their lives in response to specific life events and activities. Normal anxiety actually helps us prepare for and get through these life events and activities. Normal anxiety goes away after the event or activity is over.  GAD causes anxiety that is not necessarily related to specific events or activities. It also causes excess anxiety in proportion to specific events or activities. The anxiety associated with GAD is also difficult to control. GAD can vary from mild to severe. People with severe GAD can have intense waves of anxiety with physical symptoms (panic attacks).  SYMPTOMS The anxiety and worry associated with GAD are difficult to control. This anxiety and worry are related to many life events and activities and also occur more days than not for 6 months or longer. People with GAD also have three or more of the following symptoms (one or more in children):  Restlessness.   Fatigue.  Difficulty concentrating.   Irritability.  Muscle tension.  Difficulty sleeping or unsatisfying sleep. DIAGNOSIS GAD is diagnosed through an assessment by your health care provider. Your health care provider will ask you questions aboutyour mood,physical symptoms, and events in your life. Your health care provider may ask you about your medical history and use of alcohol or drugs, including prescription medicines. Your health care provider may also do a physical exam and blood tests. Certain medical conditions and the use of certain substances can cause symptoms similar to those associated with GAD. Your health care provider may refer you to a mental health specialist for further evaluation. TREATMENT The following therapies are usually used to treat GAD:    Medication. Antidepressant medication usually is prescribed for long-term daily control. Antianxiety medicines may be added in severe cases, especially when panic attacks occur.   Talk therapy (psychotherapy). Certain types of talk therapy can be helpful in treating GAD by providing support, education, and guidance. A form of talk therapy called cognitive behavioral therapy can teach you healthy ways to think about and react to daily life events and activities.  Stress managementtechniques. These include yoga, meditation, and exercise and can be very helpful when they are practiced regularly. A mental health specialist can help determine which treatment is best for you. Some people see improvement with one therapy. However, other people require a combination of therapies.   This information is not intended to replace advice given to you by your health care provider. Make sure you discuss any questions you have with your health care provider.   Document Released: 07/30/2012 Document Revised: 04/25/2014 Document Reviewed: 07/30/2012 Elsevier Interactive Patient Education 2016 Elsevier Inc.  

## 2015-10-02 ENCOUNTER — Ambulatory Visit: Payer: Medicaid Other | Attending: Family Medicine

## 2015-10-02 DIAGNOSIS — Z13228 Encounter for screening for other metabolic disorders: Secondary | ICD-10-CM | POA: Insufficient documentation

## 2015-10-02 LAB — COMPLETE METABOLIC PANEL WITH GFR
ALBUMIN: 4.3 g/dL (ref 3.6–5.1)
ALT: 15 U/L (ref 9–46)
AST: 16 U/L (ref 10–40)
Alkaline Phosphatase: 67 U/L (ref 40–115)
BUN: 14 mg/dL (ref 7–25)
CHLORIDE: 102 mmol/L (ref 98–110)
CO2: 25 mmol/L (ref 20–31)
Calcium: 9.4 mg/dL (ref 8.6–10.3)
Creat: 1.18 mg/dL (ref 0.60–1.35)
GFR, EST NON AFRICAN AMERICAN: 80 mL/min (ref >=60)
GFR, Est African American: >89 mL/min (ref >=60)
GLUCOSE: 96 mg/dL (ref 65–99)
POTASSIUM: 4 mmol/L (ref 3.5–5.3)
SODIUM: 137 mmol/L (ref 135–146)
Total Bilirubin: 0.5 mg/dL (ref 0.2–1.2)
Total Protein: 7.1 g/dL (ref 6.1–8.1)

## 2015-10-02 LAB — LIPID PANEL
CHOL/HDL RATIO: 5 ratio (ref <=5.0)
Cholesterol: 233 mg/dL — ABNORMAL HIGH (ref 125–200)
HDL: 47 mg/dL (ref >=40)
LDL CALC: 165 mg/dL — AB (ref <130)
Triglycerides: 104 mg/dL (ref <150)
VLDL: 21 mg/dL (ref <30)

## 2015-10-23 ENCOUNTER — Encounter (HOSPITAL_COMMUNITY): Payer: Self-pay | Admitting: Emergency Medicine

## 2015-10-23 ENCOUNTER — Emergency Department (HOSPITAL_COMMUNITY)
Admission: EM | Admit: 2015-10-23 | Discharge: 2015-10-23 | Disposition: A | Discharge status: Home / Self Care | Payer: Medicaid Other | Attending: Emergency Medicine | Admitting: Emergency Medicine

## 2015-10-23 DIAGNOSIS — Z87891 Personal history of nicotine dependence: Secondary | ICD-10-CM | POA: Insufficient documentation

## 2015-10-23 DIAGNOSIS — Z79899 Other long term (current) drug therapy: Secondary | ICD-10-CM | POA: Insufficient documentation

## 2015-10-23 DIAGNOSIS — F419 Anxiety disorder, unspecified: Secondary | ICD-10-CM

## 2015-10-23 DIAGNOSIS — Z760 Encounter for issue of repeat prescription: Secondary | ICD-10-CM | POA: Insufficient documentation

## 2015-10-23 MED ORDER — BUSPIRONE HCL 10 MG PO TABS: 10.0000 mg | ORAL_TABLET | Freq: Two times a day (BID) | ORAL | Status: DC

## 2015-10-23 NOTE — ED Notes (Signed)
The pt is here to get a rx for buspar  His rx ran olut

## 2015-10-23 NOTE — ED Provider Notes (Signed)
CSN: [id number]     Arrival date & time 10/23/15  1926 History  By signing my name below, I, Preston [name], attest that this documentation has been prepared under the direction and in the presence of Will Mazel Villela, PA-C Electronically Signed: Soijett [name], ED Scribe. 10/23/2015. 8:57 PM.   Chief Complaint  Patient presents with  . Medication Refill      The history is provided by the patient. No language interpreter was used.    HPI Comments: Preston [name] is a 35 y.o. male with a medical hx of depression, anxiety, who presents to the Emergency Department complaining of medication refill onset yesterday. Pt reports that he forgot to go to his PCP for the refill of his medication today. He notes that he has ran out of his TID 10 mg busbar yesterday. He states that he has not tried any medications for the relief for his symptoms. He denies CP, SOB, SI, and any other symptoms.   Past Medical History  Diagnosis Date  . Depression   . Anxiety   . Gastroparesis   . Acid reflux    Past Surgical History  Procedure Laterality Date  . No past surgeries     Family History  Problem Relation Age of Onset  . Cancer Father   . Diabetes Father   . Heart attack Other    Social History  Substance Use Topics  . Smoking status: Former Smoker -- 0.00 packs/day    Types: Cigars    Quit date: 09/06/2014  . Smokeless tobacco: Never Used  . Alcohol Use: Yes     Comment: occasional use    Review of Systems  Constitutional: Negative for fever.  Respiratory: Negative for shortness of breath.   Cardiovascular: Negative for chest pain.  Skin: Negative for rash.  Psychiatric/Behavioral: Negative for suicidal ideas. The patient is nervous/anxious.       Allergies  Other; Red dye; and Yellow dyes (non-tartrazine)  Home Medications   Prior to Admission medications   Medication Sig Start Date End Date Taking? Authorizing Provider  busPIRone (BUSPAR) 10 MG tablet Take 1 tablet (10 mg total) by  mouth 2 (two) times daily. Must have OV for refills 10/23/15   [name], PA-C  cetirizine (ZYRTEC ALLERGY) 10 MG tablet Take 1 tablet (10 mg total) by mouth daily. 12/10/14   [name], PA-C  pantoprazole (PROTONIX) 40 MG tablet Take 1 tablet (40 mg total) by mouth 2 (two) times daily. 08/22/15   [name], MD   BP 130/86 mmHg  Pulse 65  Temp(Src) 98.1 F (36.7 C)  Resp 18  SpO2 100% Physical Exam  Constitutional: He appears well-developed and well-nourished. No distress.  Non-toxic appearing.  HENT:  Head: Normocephalic and atraumatic.  Eyes: Conjunctivae are normal. Pupils are equal, round, and reactive to light. Right eye exhibits no discharge. Left eye exhibits no discharge.  Neck: Neck supple.  Cardiovascular: Normal rate, regular rhythm, normal heart sounds and intact distal pulses.   Pulmonary/Chest: Effort normal and breath sounds normal. No respiratory distress.  Abdominal: Soft. There is no tenderness.  Lymphadenopathy:    He has no cervical adenopathy.  Neurological: He is alert. Coordination normal.  Skin: Skin is warm and dry. No rash noted. He is not diaphoretic. No erythema. No pallor.  Psychiatric: He has a normal mood and affect. His behavior is normal. He expresses no homicidal and no suicidal ideation.  Nursing note and vitals reviewed.   ED Course  Procedures (including critical  care time) DIAGNOSTIC STUDIES: Oxygen Saturation is 100% on RA, nl by my interpretation.    COORDINATION OF CARE: 8:56 PM Discussed treatment plan with pt at bedside which includes buspar refill and pt agreed to plan.   MDM   Meds given in ED:  Medications - No data to display  New Prescriptions   No medications on file    Final diagnoses:  Medication refill    Pt here for refill of medication of Buspar. He has not had his medication since yesterday. Medication is not a controlled substance. Will refill medication here. Discussed need to follow up with PCP in 2-3  days.  Pt is safe for discharge at this time. I advised the patient to follow-up with their primary care provider this week. I advised the patient to return to the emergency department with new or worsening symptoms or new concerns. The patient verbalized understanding and agreement with plan.    I personally performed the services described in this documentation, which was scribed in my presence. The recorded information has been reviewed and is accurate.         Everlene FarrierWilliam [name], PA-C 10/23/15 2059  Laurence Spatesachel Morgan Little, MD 10/24/15 [phone]

## 2015-10-23 NOTE — Discharge Instructions (Signed)
Medicine Refill at the Emergency Department We have refilled your medicine today, but it is best for you to get refills through your primary health care provider's office. In the future, please plan ahead so you do not need to get refills from the emergency department. If the medicine we refilled was a maintenance medicine, you may have received only enough to get you by until you are able to see your regular health care provider.   This information is not intended to replace advice given to you by your health care provider. Make sure you discuss any questions you have with your health care provider.   Document Released: 07/22/2003 Document Revised: 04/25/2014 Document Reviewed: 07/12/2013 Elsevier Interactive Patient Education 2016 Elsevier Inc. Generalized Anxiety Disorder Generalized anxiety disorder (GAD) is a mental disorder. It interferes with life functions, including relationships, work, and school. GAD is different from normal anxiety, which everyone experiences at some point in their lives in response to specific life events and activities. Normal anxiety actually helps us prepare for and get through these life events and activities. Normal anxiety goes away after the event or activity is over.  GAD causes anxiety that is not necessarily related to specific events or activities. It also causes excess anxiety in proportion to specific events or activities. The anxiety associated with GAD is also difficult to control. GAD can vary from mild to severe. People with severe GAD can have intense waves of anxiety with physical symptoms (panic attacks).  SYMPTOMS The anxiety and worry associated with GAD are difficult to control. This anxiety and worry are related to many life events and activities and also occur more days than not for 6 months or longer. People with GAD also have three or more of the following symptoms (one or more in children):  Restlessness.   Fatigue.  Difficulty  concentrating.   Irritability.  Muscle tension.  Difficulty sleeping or unsatisfying sleep. DIAGNOSIS GAD is diagnosed through an assessment by your health care provider. Your health care provider will ask you questions aboutyour mood,physical symptoms, and events in your life. Your health care provider may ask you about your medical history and use of alcohol or drugs, including prescription medicines. Your health care provider may also do a physical exam and blood tests. Certain medical conditions and the use of certain substances can cause symptoms similar to those associated with GAD. Your health care provider may refer you to a mental health specialist for further evaluation. TREATMENT The following therapies are usually used to treat GAD:   Medication. Antidepressant medication usually is prescribed for long-term daily control. Antianxiety medicines may be added in severe cases, especially when panic attacks occur.   Talk therapy (psychotherapy). Certain types of talk therapy can be helpful in treating GAD by providing support, education, and guidance. A form of talk therapy called cognitive behavioral therapy can teach you healthy ways to think about and react to daily life events and activities.  Stress managementtechniques. These include yoga, meditation, and exercise and can be very helpful when they are practiced regularly. A mental health specialist can help determine which treatment is best for you. Some people see improvement with one therapy. However, other people require a combination of therapies.   This information is not intended to replace advice given to you by your health care provider. Make sure you discuss any questions you have with your health care provider.   Document Released: 07/30/2012 Document Revised: 04/25/2014 Document Reviewed: 07/30/2012 Elsevier Interactive Patient Education [employer] Elsevier Inc.

## 2015-10-23 NOTE — ED Notes (Signed)
Pt states "i forgot to get to my doctors office to get my anxiety medicine." Buspar is the medication. Pt denies feeling anxious at the moment, just would like a prescription for his meds. Pt in NAD. Denies SI/HI.

## 2015-11-10 MED FILL — raNITIdine HCL 150 MG TABS: 150 | 30 days supply | Qty: 30 | Fill #0

## 2015-11-16 MED FILL — busPIRone HCL 10 MG TABS: 10 | 30 days supply | Qty: 60 | Fill #1

## 2015-11-19 IMAGING — DX DG CHEST 2V
2 series · 2 of 2 positions shown · non-contrast
Comparison: None.

CLINICAL DATA: Chest pain started [REDACTED]. Midsternal pain
radiating to left side of the chest.

EXAM:
CHEST  2 VIEW

[chest pa]
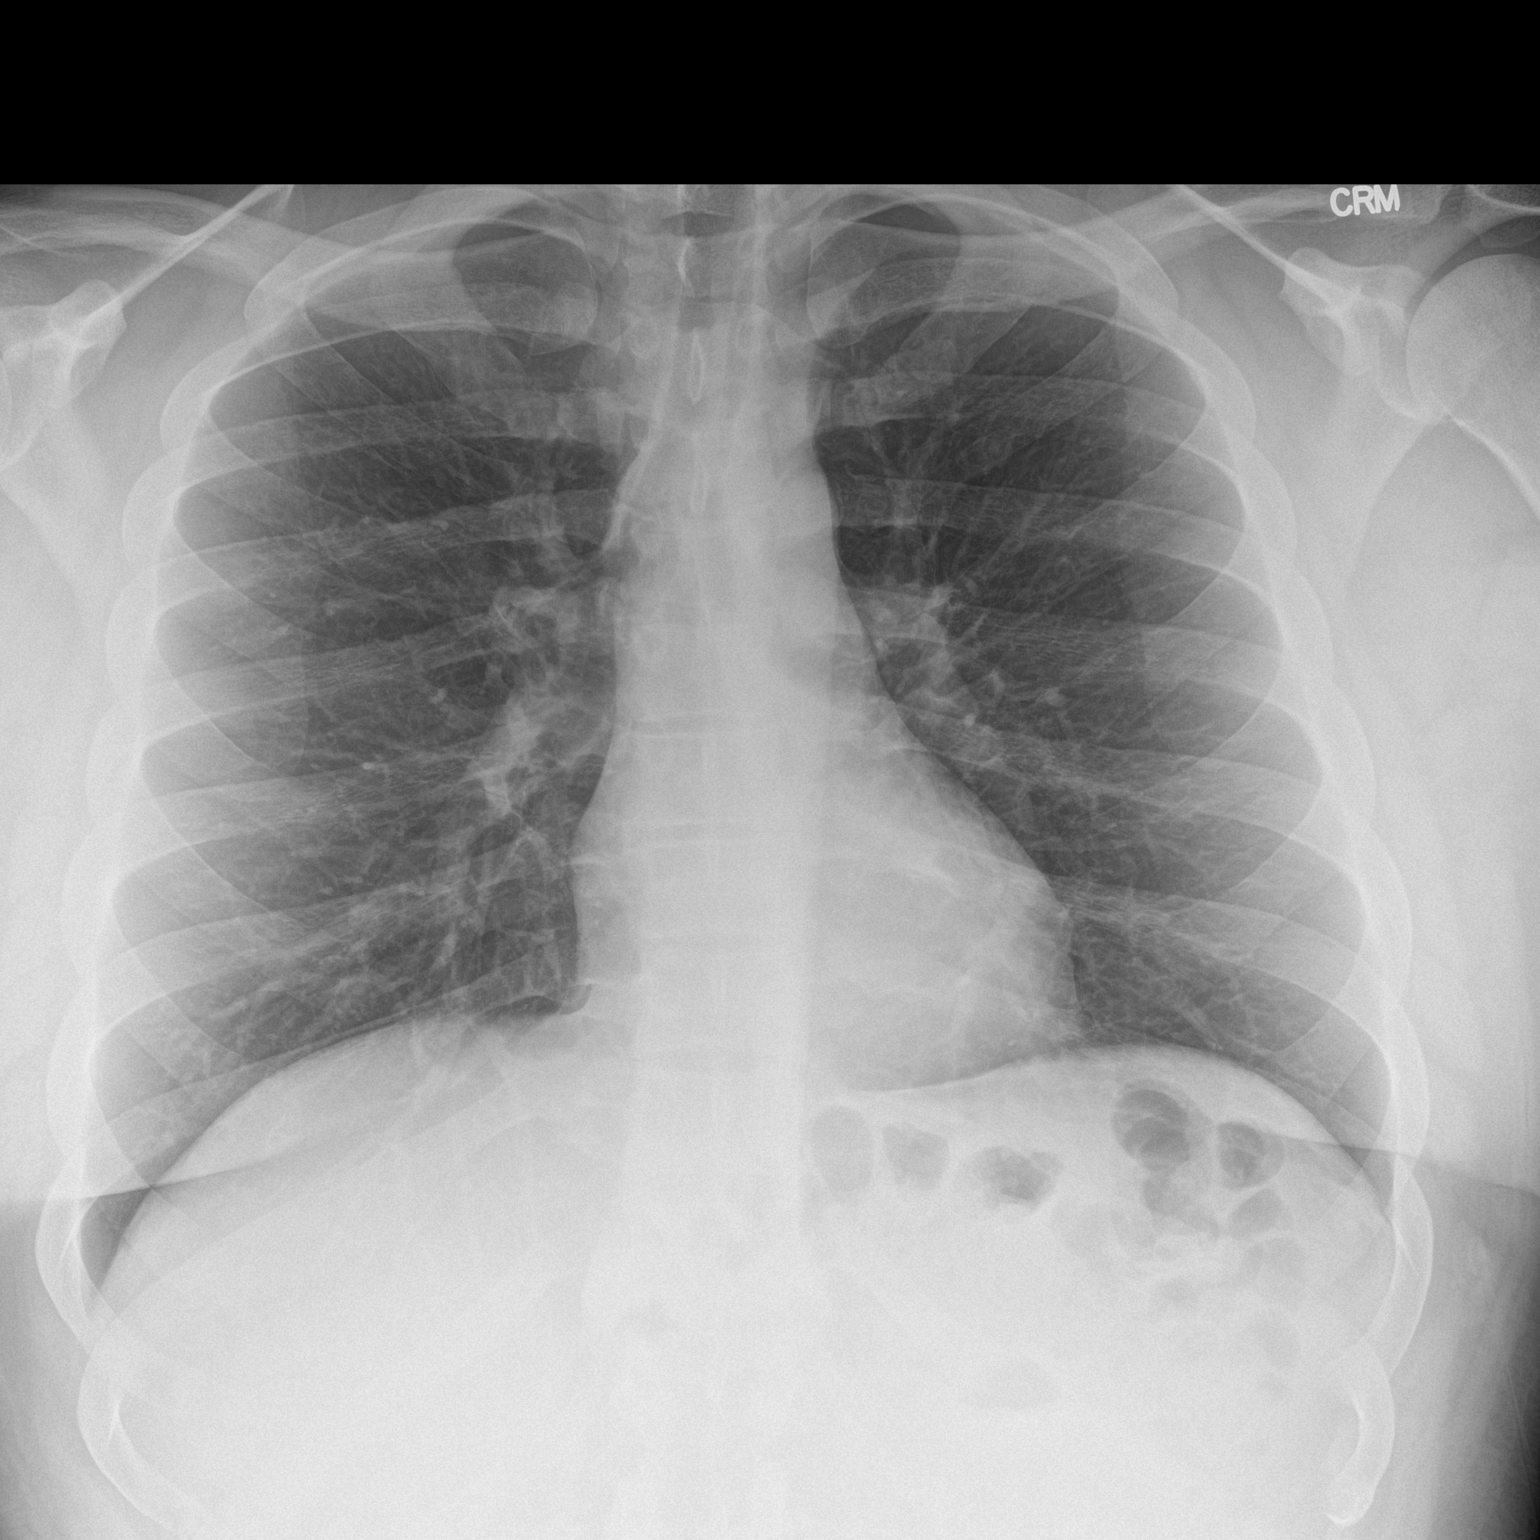

[chest lat]
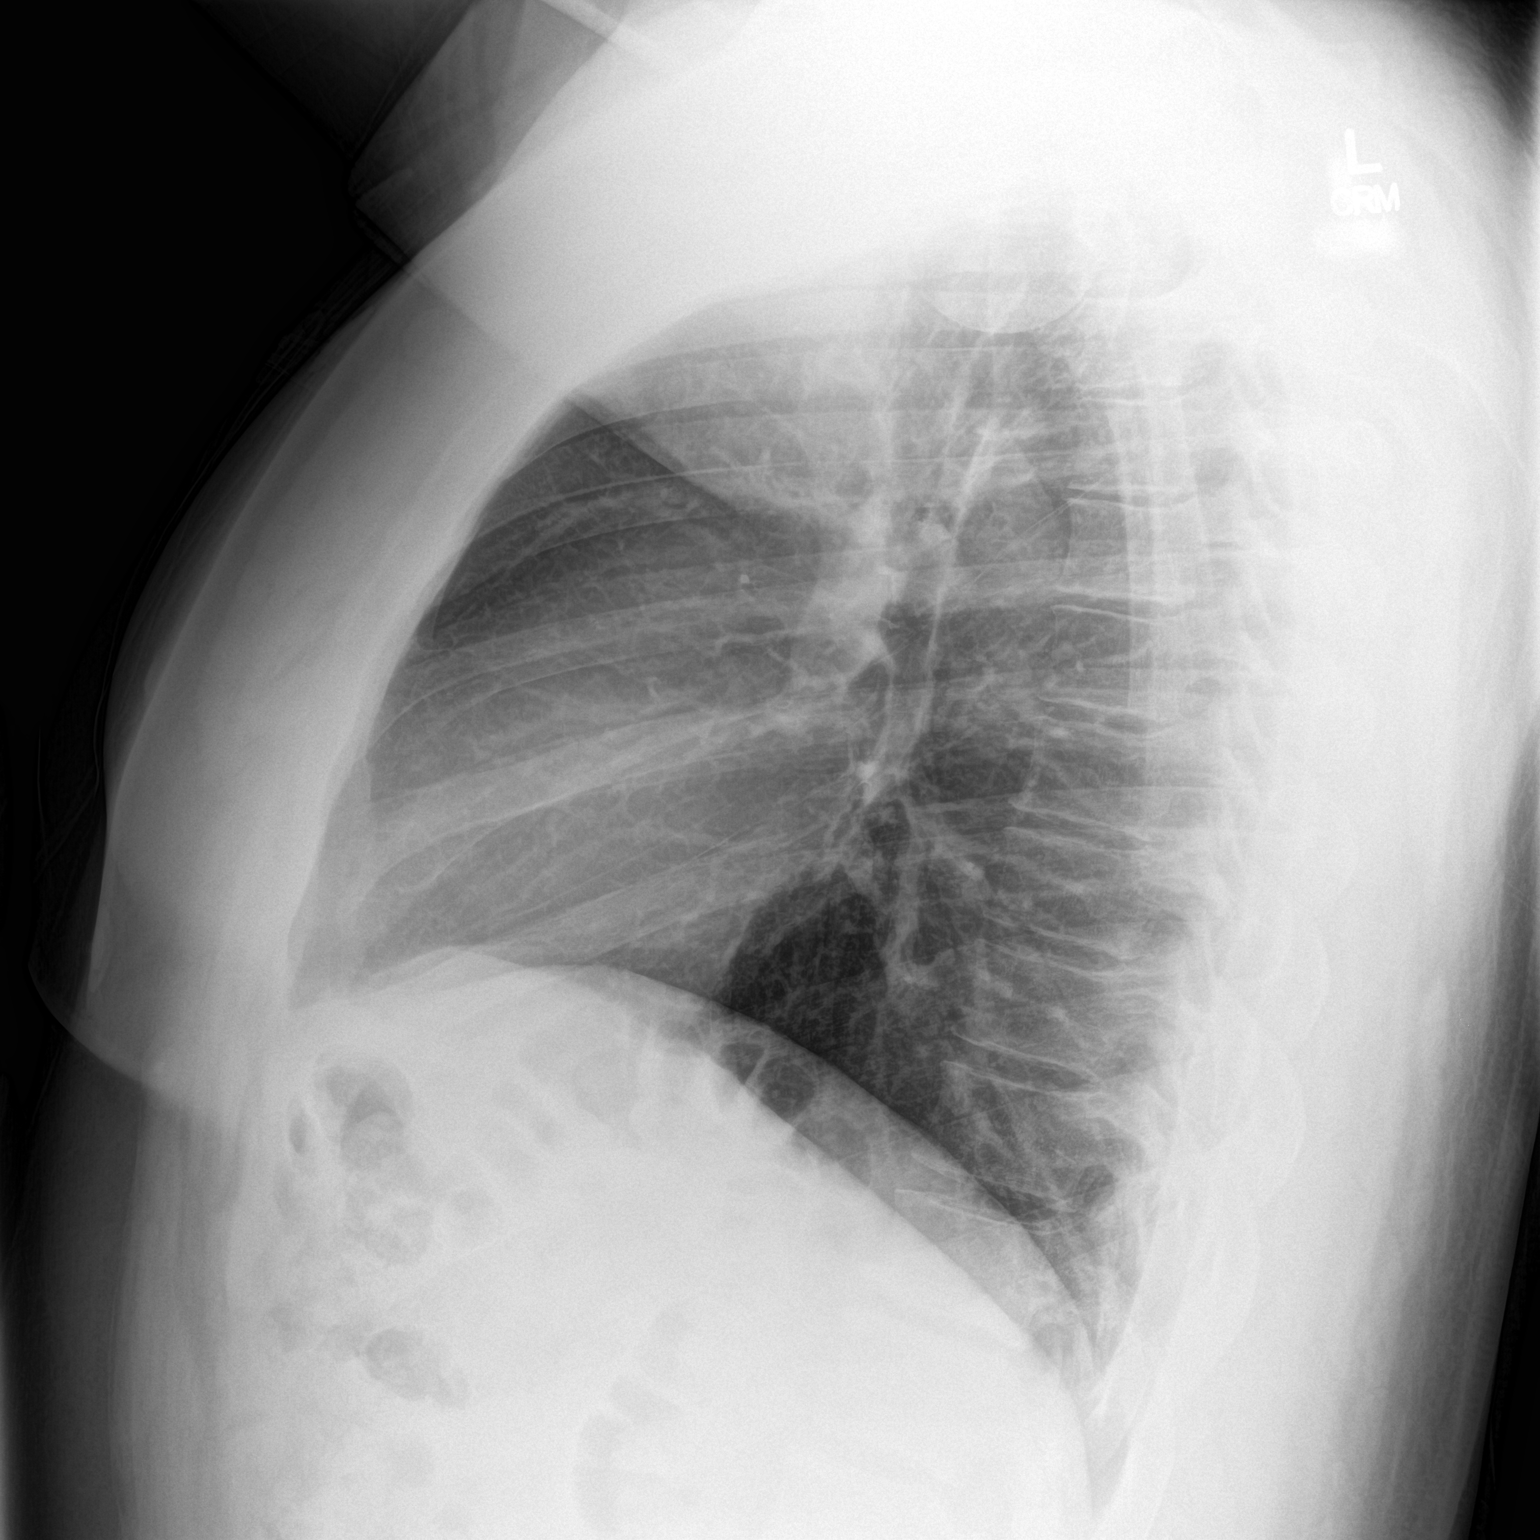

[2 of 2 positions shown; findings below may reference images not displayed]

FINDINGS: The heart size and mediastinal contours are within normal limits.
Both lungs are clear. The visualized skeletal structures are
unremarkable.
IMPRESSION: No active cardiopulmonary disease.

## 2015-11-20 ENCOUNTER — Ambulatory Visit: Payer: Self-pay | Attending: Internal Medicine | Admitting: Physician Assistant

## 2015-11-20 ENCOUNTER — Encounter: Payer: Self-pay | Admitting: Physician Assistant

## 2015-11-20 ENCOUNTER — Other Ambulatory Visit: Payer: Self-pay | Admitting: Pharmacist

## 2015-11-20 VITALS — BP 132/83 | HR 79 | Temp 98.1°F | Ht 77.0 in | Wt 317.5 lb

## 2015-11-20 DIAGNOSIS — F419 Anxiety disorder, unspecified: Secondary | ICD-10-CM

## 2015-11-20 DIAGNOSIS — M25562 Pain in left knee: Secondary | ICD-10-CM | POA: Insufficient documentation

## 2015-11-20 DIAGNOSIS — Z79899 Other long term (current) drug therapy: Secondary | ICD-10-CM | POA: Insufficient documentation

## 2015-11-20 MED ORDER — MELOXICAM 15 MG PO TABS
15.0000 mg | ORAL_TABLET | Freq: Every day | ORAL | 1 refills | Status: DC
Start: 2015-11-20 — End: 2016-11-02

## 2015-11-20 MED ORDER — BUSPIRONE HCL 10 MG PO TABS
10.0000 mg | ORAL_TABLET | Freq: Three times a day (TID) | ORAL | 1 refills | Status: DC
Start: 2015-11-20 — End: 2015-11-25

## 2015-11-20 MED ORDER — BUSPIRONE HCL 10 MG PO TABS: 10.0000 mg | ORAL_TABLET | Freq: Two times a day (BID) | ORAL | 3 refills | Status: DC

## 2015-11-20 MED FILL — MELOXICAM 15 MG TABLET: 15 | 30 days supply | Qty: 30 | Fill #0
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
## 2015-11-20 NOTE — Progress Notes (Signed)
BP high last week-

## 2015-11-25 ENCOUNTER — Other Ambulatory Visit: Payer: Self-pay | Admitting: Pharmacist

## 2015-11-25 DIAGNOSIS — F419 Anxiety disorder, unspecified: Secondary | ICD-10-CM

## 2015-11-25 MED ORDER — BUSPIRONE HCL 10 MG PO TABS: 10.0000 mg | ORAL_TABLET | Freq: Three times a day (TID) | ORAL | 2 refills | Status: DC

## 2015-11-26 ENCOUNTER — Ambulatory Visit: Payer: Medicaid Other

## 2015-11-29 IMAGING — DX DG CHEST 2V
2 series · 2 of 2 positions shown · non-contrast
Comparison: Prior radiograph from 11/04/2014

CLINICAL DATA: Initial evaluation for acute chest pain.

EXAM:
CHEST  2 VIEW

[chest pa]
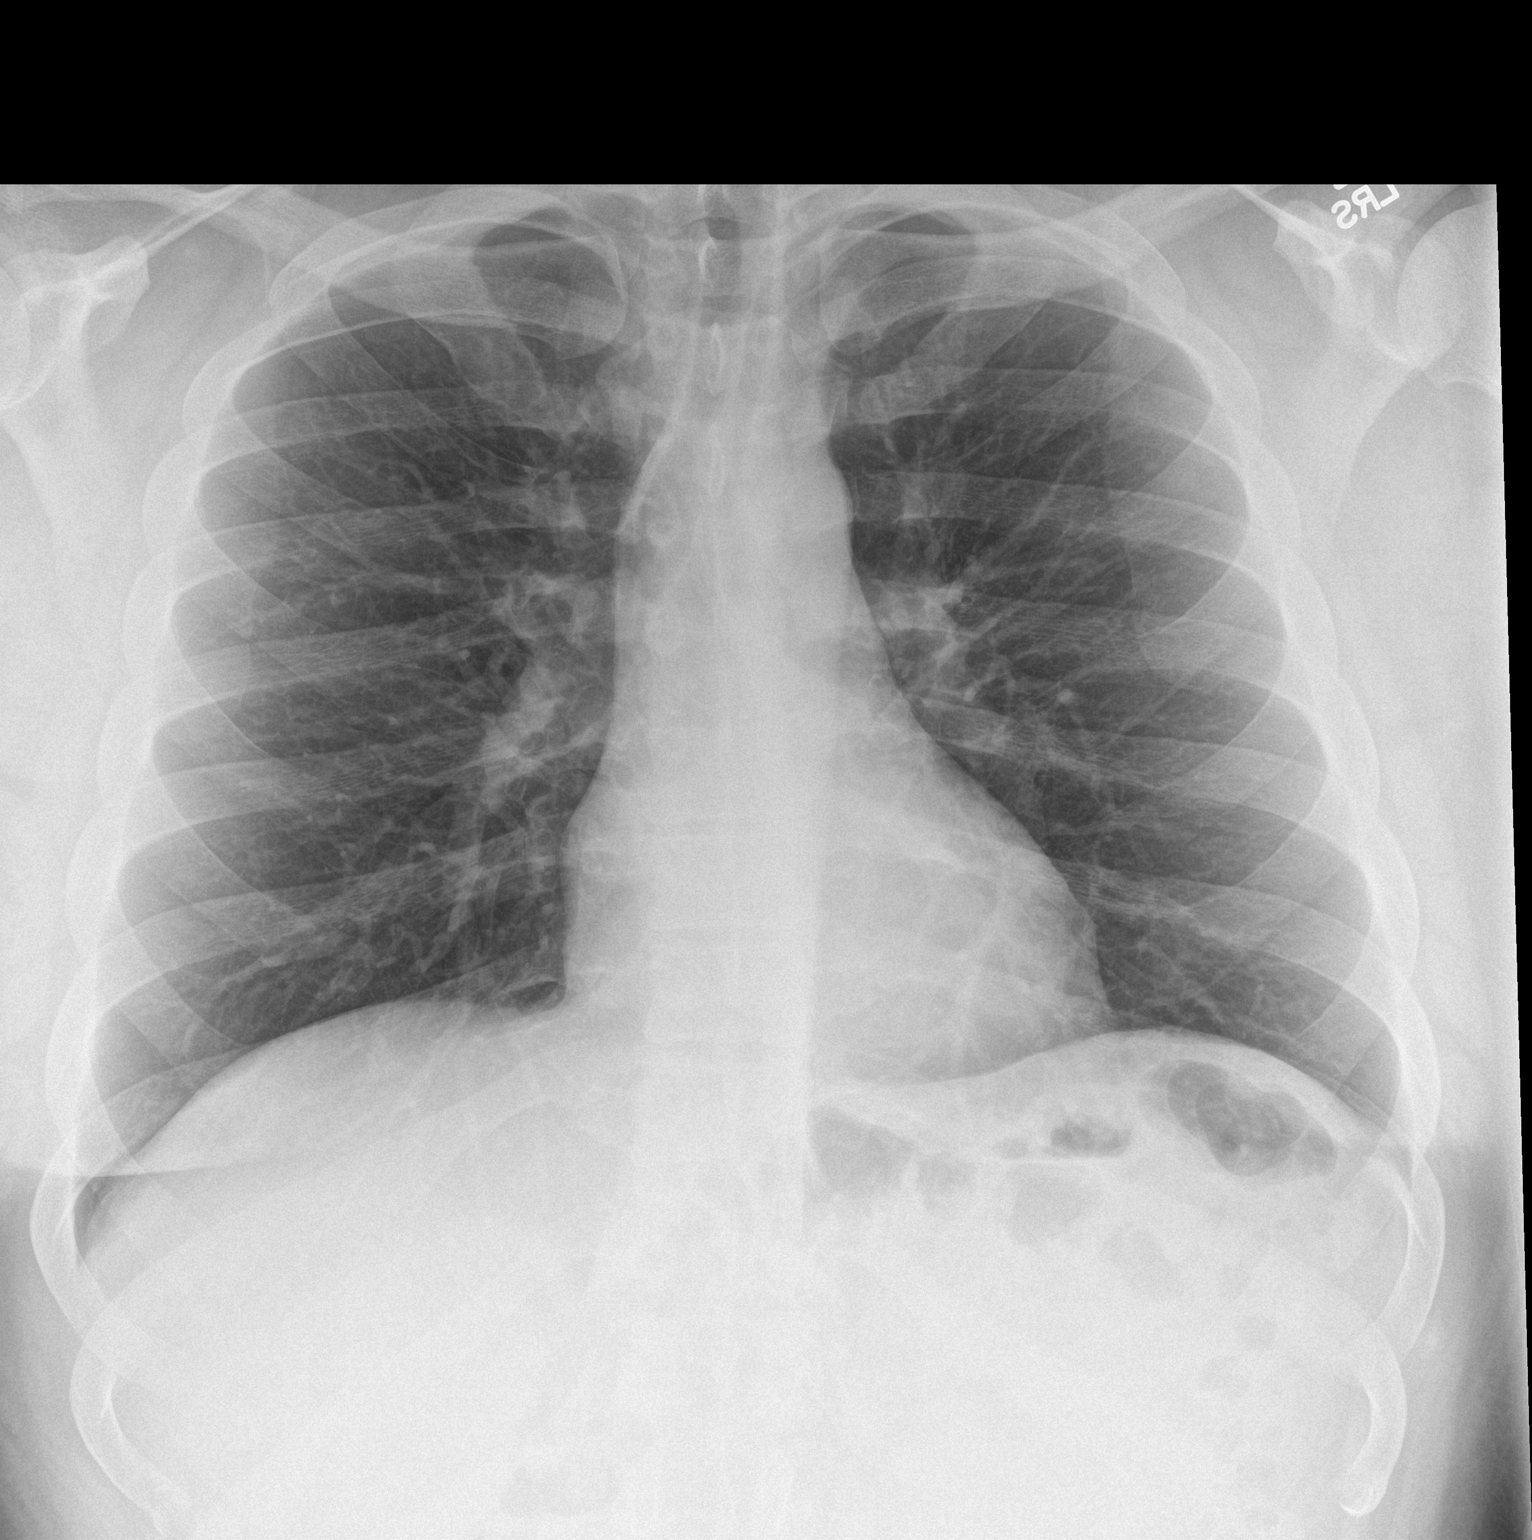

[chest lat]
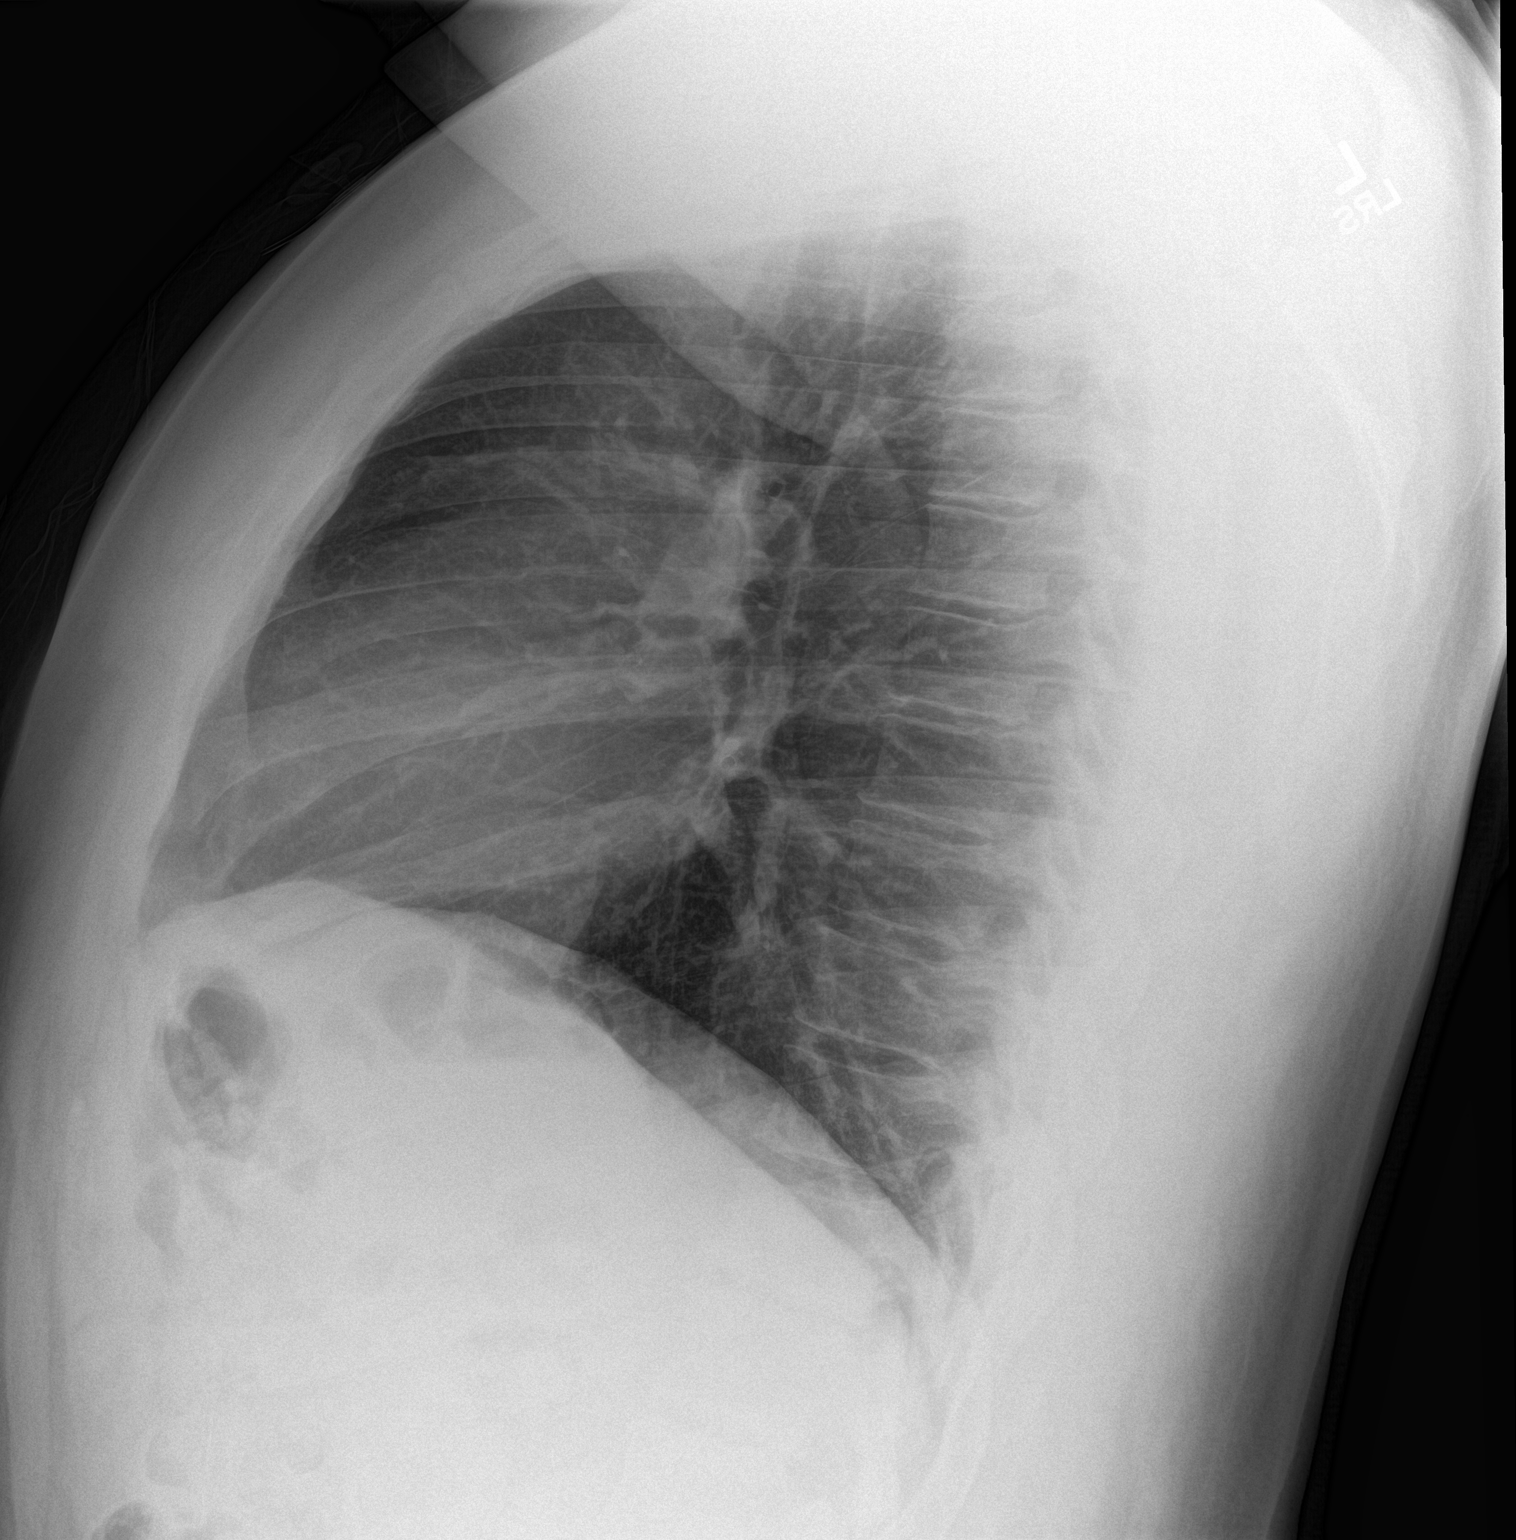

[2 of 2 positions shown; findings below may reference images not displayed]

FINDINGS: The cardiac and mediastinal silhouettes are stable in size and
contour, and remain within normal limits.

The lungs are normally inflated. No airspace consolidation, pleural
effusion, or pulmonary edema is identified. There is no
pneumothorax.

No acute osseous abnormality identified.
IMPRESSION: No active cardiopulmonary disease.

## 2015-12-02 ENCOUNTER — Ambulatory Visit: Payer: Medicaid Other

## 2015-12-09 MED FILL — busPIRone HCL 10 MG TABS: 10 | 30 days supply | Qty: 60 | Fill #2

## 2015-12-10 ENCOUNTER — Ambulatory Visit: Payer: Medicaid Other | Attending: Family Medicine

## 2015-12-13 ENCOUNTER — Emergency Department (HOSPITAL_COMMUNITY): Payer: Self-pay

## 2015-12-13 ENCOUNTER — Emergency Department (HOSPITAL_COMMUNITY)
Admission: EM | Admit: 2015-12-13 | Discharge: 2015-12-13 | Disposition: A | Discharge status: Home / Self Care | Payer: Self-pay | Attending: Emergency Medicine | Admitting: Emergency Medicine

## 2015-12-13 ENCOUNTER — Encounter (HOSPITAL_COMMUNITY): Payer: Self-pay | Admitting: Emergency Medicine

## 2015-12-13 DIAGNOSIS — Y939 Activity, unspecified: Secondary | ICD-10-CM | POA: Insufficient documentation

## 2015-12-13 DIAGNOSIS — Y929 Unspecified place or not applicable: Secondary | ICD-10-CM | POA: Insufficient documentation

## 2015-12-13 DIAGNOSIS — Y990 Civilian activity done for income or pay: Secondary | ICD-10-CM | POA: Insufficient documentation

## 2015-12-13 DIAGNOSIS — Z87891 Personal history of nicotine dependence: Secondary | ICD-10-CM | POA: Insufficient documentation

## 2015-12-13 DIAGNOSIS — S8392XA Sprain of unspecified site of left knee, initial encounter: Secondary | ICD-10-CM | POA: Insufficient documentation
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
## 2015-12-13 NOTE — ED Notes (Addendum)
States took Ibuprofen at 1200 today. Male w/pt.

## 2015-12-13 NOTE — Discharge Instructions (Signed)
Please follow-up with orthopedic specialist for further evaluation if symptoms persist. Please use ice, ibuprofen, rest.
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
## 2015-12-31 MED FILL — busPIRone HCL 10 MG TABS: 10 | 30 days supply | Qty: 60 | Fill #3

## 2016-01-23 IMAGING — DX DG CHEST 2V
2 series · 2 of 2 positions shown · non-contrast
Comparison: Radiograph dated 12/04/2014

CLINICAL DATA: 34-year-old male with chest pain

EXAM:
CHEST  2 VIEW

[chest pa]
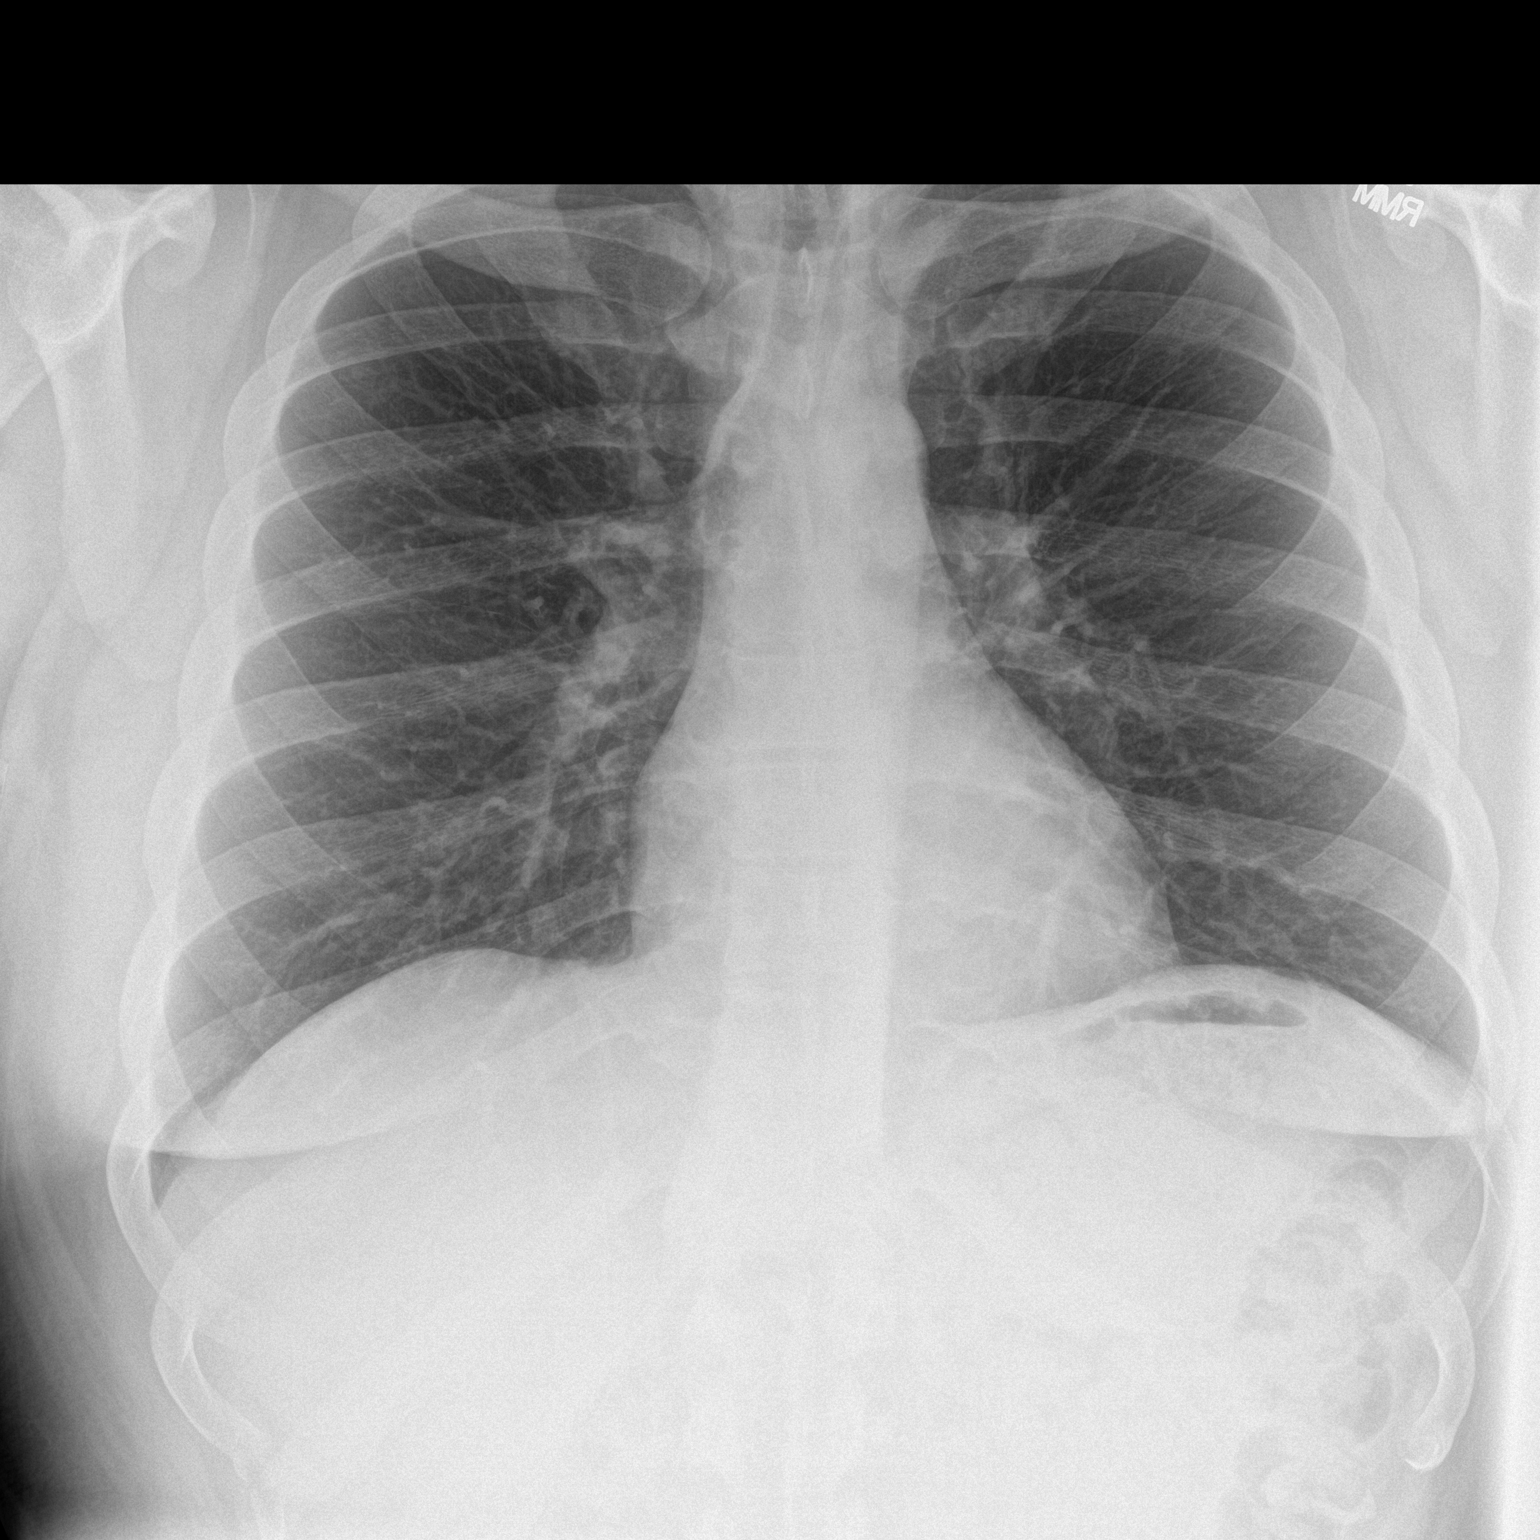

[chest lat]
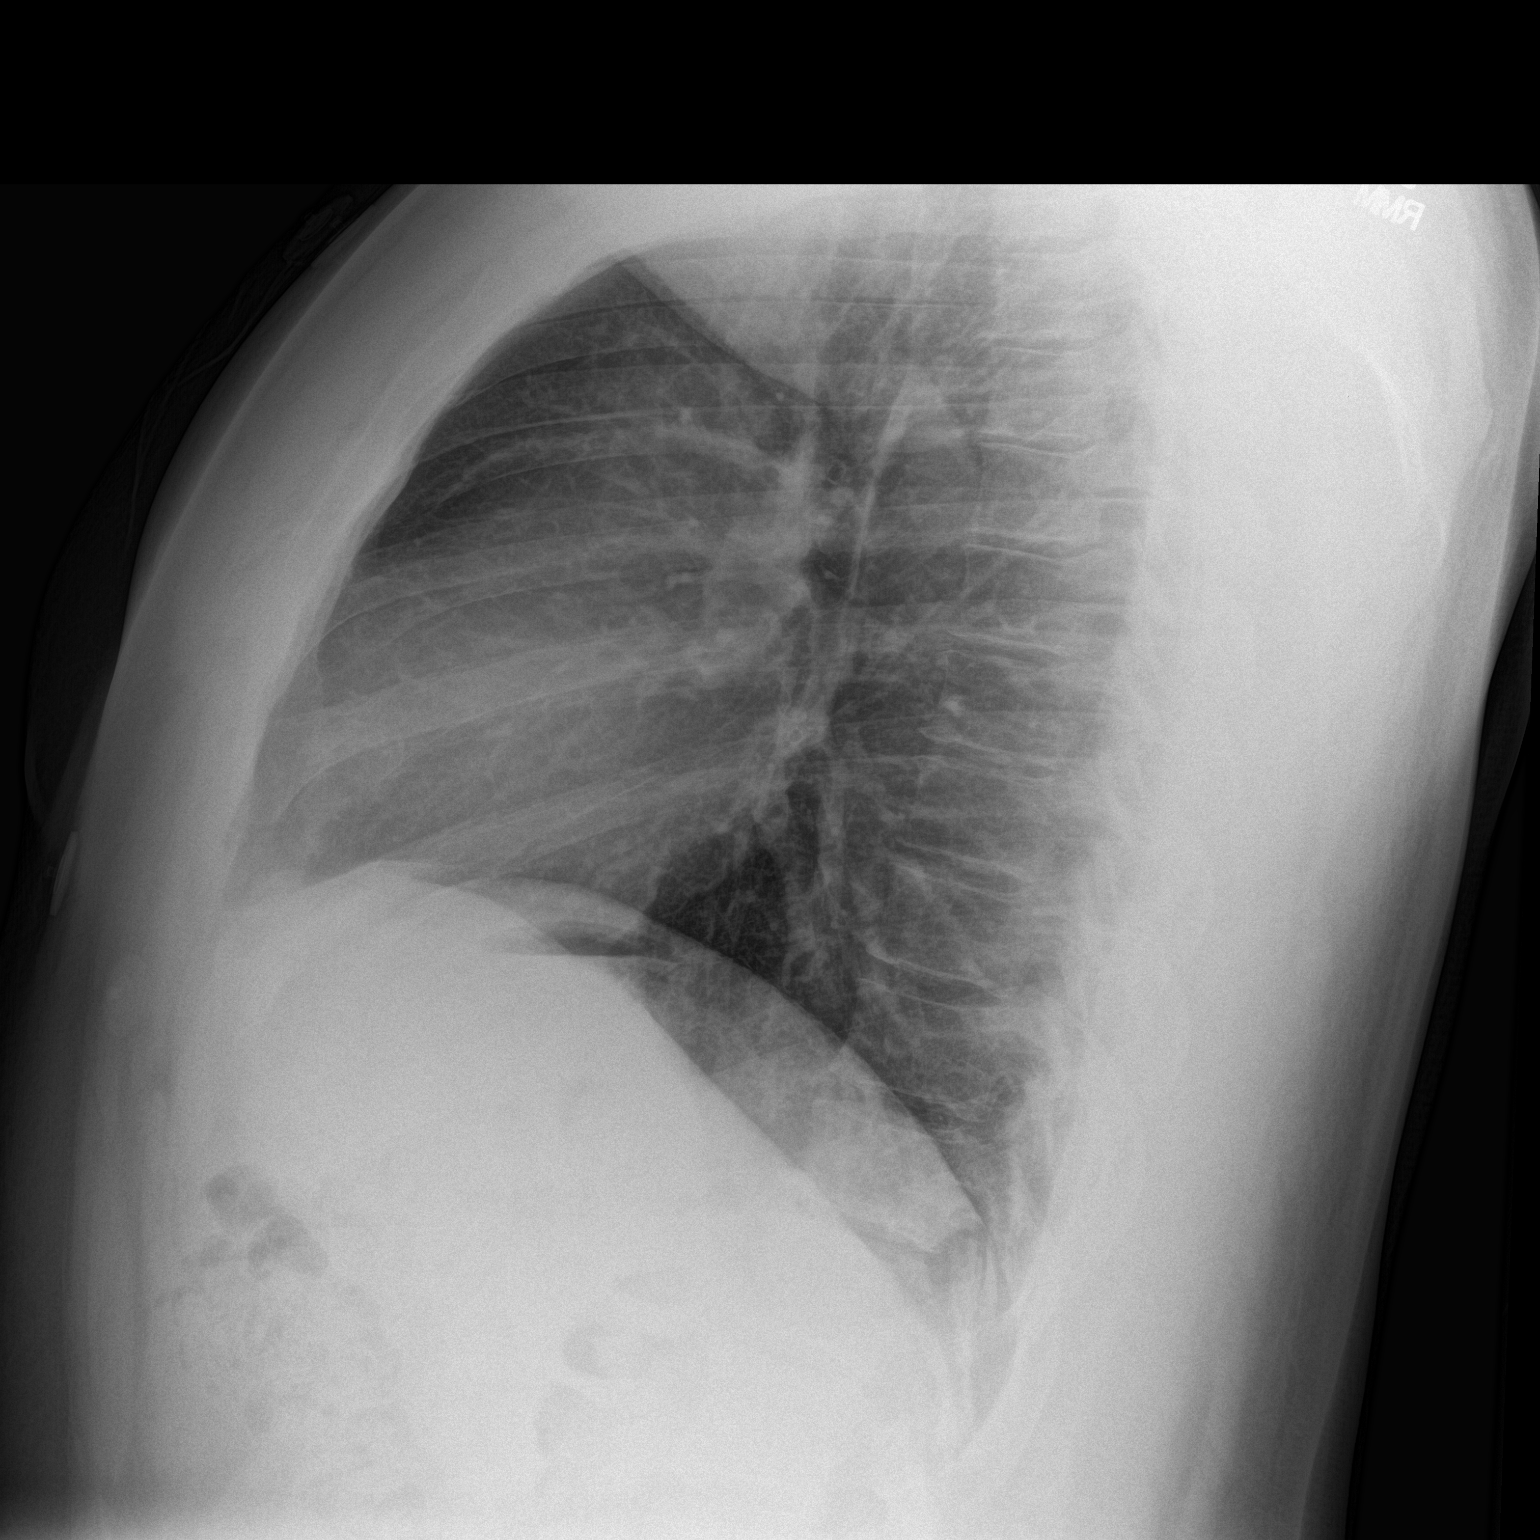

[2 of 2 positions shown; findings below may reference images not displayed]

FINDINGS: The heart size and mediastinal contours are within normal limits.
Both lungs are clear. The visualized skeletal structures are
unremarkable.
IMPRESSION: No active cardiopulmonary disease.

## 2016-01-28 MED FILL — busPIRone HCL 10 MG TABS: 10 | 30 days supply | Qty: 60 | Fill #4

## 2016-02-24 MED FILL — ?BUSPIRONE HCL 10 MG TABLET: 10 | 30 days supply | Qty: 60 | Fill #5

## 2016-03-25 ENCOUNTER — Other Ambulatory Visit: Payer: Self-pay | Admitting: Family Medicine

## 2016-03-25 DIAGNOSIS — F419 Anxiety disorder, unspecified: Secondary | ICD-10-CM

## 2016-03-25 NOTE — Telephone Encounter (Signed)
Pts mother [facility] Mother states pt is out of his Protonix and Buspirone and that a prior authorization was faxed yesterday 12/7. Mother states pt needs his medication as soon as possible  Mother was informed the Protonix was [facility] and would likely not be refilled by our office

## 2016-03-28 MED ORDER — BUSPIRONE HCL 10 MG PO TABS: 10.0000 mg | ORAL_TABLET | Freq: Three times a day (TID) | ORAL | 2 refills | Status: DC

## 2016-03-28 MED ORDER — PANTOPRAZOLE SODIUM 40 MG PO TBEC: 40.0000 mg | DELAYED_RELEASE_TABLET | Freq: Every day | ORAL | 3 refills | Status: DC

## 2016-03-28 NOTE — Telephone Encounter (Signed)
Writer LVM for patient's mother verifying that MD sent prescriptions over to patient's pharmacy per mothers request.

## 2016-03-28 NOTE — Telephone Encounter (Signed)
Done

## 2016-07-18 ENCOUNTER — Other Ambulatory Visit: Payer: Self-pay | Admitting: Family Medicine

## 2016-07-18 DIAGNOSIS — F419 Anxiety disorder, unspecified: Secondary | ICD-10-CM

## 2016-10-03 ENCOUNTER — Other Ambulatory Visit: Payer: Self-pay | Admitting: Family Medicine

## 2016-10-03 ENCOUNTER — Telehealth: Payer: Self-pay | Admitting: Family Medicine

## 2016-10-03 DIAGNOSIS — F419 Anxiety disorder, unspecified: Secondary | ICD-10-CM

## 2016-10-03 MED ORDER — BUSPIRONE HCL 10 MG PO TABS: ORAL_TABLET | ORAL | 0 refills | Status: DC

## 2016-10-03 MED FILL — ?BUSPIRONE HCL 10 MG TABLET: 10 | 30 days supply | Qty: 90 | Fill #0

## 2016-10-03 NOTE — Telephone Encounter (Signed)
Pt made appt for 10/21/16 to re-est care: would like refill of busperone to last umtil his appt. Please f/u. Thank you.

## 2016-10-03 NOTE — Telephone Encounter (Signed)
Refilled x 30 days - must have office visit for any further refills. 

## 2016-10-04 ENCOUNTER — Other Ambulatory Visit: Payer: Self-pay | Admitting: Pharmacist

## 2016-10-04 DIAGNOSIS — F419 Anxiety disorder, unspecified: Secondary | ICD-10-CM

## 2016-10-04 MED ORDER — BUSPIRONE HCL 10 MG PO TABS: ORAL_TABLET | ORAL | 0 refills | Status: DC

## 2016-10-21 ENCOUNTER — Ambulatory Visit: Payer: Medicaid Other | Admitting: Family Medicine

## 2016-11-02 ENCOUNTER — Encounter: Payer: Self-pay | Admitting: Family Medicine

## 2016-11-02 ENCOUNTER — Ambulatory Visit: Payer: Self-pay | Attending: Family Medicine | Admitting: Family Medicine

## 2016-11-02 ENCOUNTER — Ambulatory Visit: Payer: Self-pay | Admitting: Licensed Clinical Social Worker

## 2016-11-02 VITALS — BP 106/73 | HR 77 | Temp 98.2°F | Resp 18 | Ht 77.0 in | Wt 331.6 lb

## 2016-11-02 DIAGNOSIS — F419 Anxiety disorder, unspecified: Secondary | ICD-10-CM

## 2016-11-02 DIAGNOSIS — Z0000 Encounter for general adult medical examination without abnormal findings: Secondary | ICD-10-CM

## 2016-11-02 DIAGNOSIS — Z79899 Other long term (current) drug therapy: Secondary | ICD-10-CM | POA: Insufficient documentation

## 2016-11-02 DIAGNOSIS — Z23 Encounter for immunization: Secondary | ICD-10-CM

## 2016-11-02 DIAGNOSIS — F32A Depression, unspecified: Secondary | ICD-10-CM

## 2016-11-02 DIAGNOSIS — F329 Major depressive disorder, single episode, unspecified: Secondary | ICD-10-CM | POA: Insufficient documentation

## 2016-11-02 DIAGNOSIS — K219 Gastro-esophageal reflux disease without esophagitis: Secondary | ICD-10-CM

## 2016-11-02 MED ORDER — BUSPIRONE HCL 10 MG PO TABS: ORAL_TABLET | ORAL | 3 refills | Status: DC

## 2016-11-02 MED FILL — busPIRone HCL 10 MG TABS: 10 | 30 days supply | Qty: 30 | Fill #0

## 2016-11-02 NOTE — Patient Instructions (Signed)
Living With Anxiety After being diagnosed with an anxiety disorder, you may be relieved to know why you have felt or behaved a certain way. It is natural to also feel overwhelmed about the treatment ahead and what it will mean for your life. With care and support, you can manage this condition and recover from it. How to cope with anxiety Dealing with stress Stress is your body's reaction to life changes and events, both good and bad. Stress can last just a few hours or it can be ongoing. Stress can play a major role in anxiety, so it is important to learn both how to cope with stress and how to think about it differently. Talk with your health care provider or a counselor to learn more about stress reduction. He or she may suggest some stress reduction techniques, such as:  Music therapy. This can include creating or listening to music that you enjoy and that inspires you.  Mindfulness-based meditation. This involves being aware of your normal breaths, rather than trying to control your breathing. It can be done while sitting or walking.  Centering prayer. This is a kind of meditation that involves focusing on a word, phrase, or sacred image that is meaningful to you and that brings you peace.  Deep breathing. To do this, expand your stomach and inhale slowly through your nose. Hold your breath for 3-5 seconds. Then exhale slowly, allowing your stomach muscles to relax.  Self-talk. This is a skill where you identify thought patterns that lead to anxiety reactions and correct those thoughts.  Muscle relaxation. This involves tensing muscles then relaxing them.  Choose a stress reduction technique that fits your lifestyle and personality. Stress reduction techniques take time and practice. Set aside 5-15 minutes a day to do them. Therapists can offer training in these techniques. The training may be covered by some insurance plans. Other things you can do to manage stress include:  Keeping a  stress diary. This can help you learn what triggers your stress and ways to control your response.  Thinking about how you respond to certain situations. You may not be able to control everything, but you can control your reaction.  Making time for activities that help you relax, and not feeling guilty about spending your time in this way.  Therapy combined with coping and stress-reduction skills provides the best chance for successful treatment. Medicines Medicines can help ease symptoms. Medicines for anxiety include:  Anti-anxiety drugs.  Antidepressants.  Beta-blockers.  Medicines may be used as the main treatment for anxiety disorder, along with therapy, or if other treatments are not working. Medicines should be prescribed by a health care provider. Relationships Relationships can play a big part in helping you recover. Try to spend more time connecting with trusted friends and family members. Consider going to couples counseling, taking family education classes, or going to family therapy. Therapy can help you and others better understand the condition. How to recognize changes in your condition Everyone has a different response to treatment for anxiety. Recovery from anxiety happens when symptoms decrease and stop interfering with your daily activities at home or work. This may mean that you will start to:  Have better concentration and focus.  Sleep better.  Be less irritable.  Have more energy.  Have improved memory.  It is important to recognize when your condition is getting worse. Contact your health care provider if your symptoms interfere with home or work and you do not feel like your condition   is improving. Where to find help and support: You can get help and support from these sources:  Self-help groups.  Online and [employer].  A trusted spiritual leader.  Couples counseling.  Family education classes.  Family therapy.  Follow these  instructions at home:  Eat a healthy diet that includes plenty of vegetables, fruits, whole grains, low-fat dairy products, and lean protein. Do not eat a lot of foods that are high in solid fats, added sugars, or salt.  Exercise. Most adults should do the following: ? Exercise for at least 150 minutes each week. The exercise should increase your heart rate and make you sweat (moderate-intensity exercise). ? Strengthening exercises at least twice a week.  Cut down on caffeine, tobacco, alcohol, and other potentially harmful substances.  Get the right amount and quality of sleep. Most adults need 7-9 hours of sleep each night.  Make choices that simplify your life.  Take over-the-counter and prescription medicines only as told by your health care provider.  Avoid caffeine, alcohol, and certain over-the-counter cold medicines. These may make you feel worse. Ask your pharmacist which medicines to avoid.  Keep all follow-up visits as told by your health care provider. This is important. Questions to ask your health care provider  Would I benefit from therapy?  How often should I follow up with a health care provider?  How long do I need to take medicine?  Are there any long-term side effects of my medicine?  Are there any alternatives to taking medicine? Contact a health care provider if:  You have a hard time staying focused or finishing daily tasks.  You spend many hours a day feeling worried about everyday life.  You become exhausted by worry.  You start to have headaches, feel tense, or have nausea.  You urinate more than normal.  You have diarrhea. Get help right away if:  You have a racing heart and shortness of breath.  You have thoughts of hurting yourself or others. If you ever feel like you may hurt yourself or others, or have thoughts about taking your own life, get help right away. You can go to your nearest emergency department or call:  Your local emergency  services (911 in the [place]  A suicide crisis helpline, such as the National Suicide Prevention Lifeline at 612 157 9033. This is open 24-hours a day.  Summary  Taking steps to deal with stress can help calm you.  Medicines cannot cure anxiety disorders, but they can help ease symptoms.  Family, friends, and partners can play a big part in helping you recover from an anxiety disorder. This information is not intended to replace advice given to you by your health care provider. Make sure you discuss any questions you have with your health care provider. Document Released: 03/29/2016 Document Revised: 03/29/2016 Document Reviewed: 03/29/2016 Elsevier Interactive Patient Education  2018 [employer].   Food Choices for Gastroesophageal Reflux Disease, Adult When you have gastroesophageal reflux disease (GERD), the foods you eat and your eating habits are very important. Choosing the right foods can help ease your discomfort. What guidelines do I need to follow?  Choose fruits, vegetables, whole grains, and low-fat dairy products.  Choose low-fat meat, fish, and poultry.  Limit fats such as oils, salad dressings, butter, nuts, and avocado.  Keep a food diary. This helps you identify foods that cause symptoms.  Avoid foods that cause symptoms. These may be different for everyone.  Eat small meals often instead of 3 large  meals a day.  Eat your meals slowly, in a place where you are relaxed.  Limit fried foods.  Cook foods using methods other than frying.  Avoid drinking alcohol.  Avoid drinking large amounts of liquids with your meals.  Avoid bending over or lying down until 2-3 hours after eating. What foods are not recommended? These are some foods and drinks that may make your symptoms worse: Vegetables Tomatoes. Tomato juice. Tomato and spaghetti sauce. Chili peppers. Onion and garlic. Horseradish. Fruits Oranges, grapefruit, and lemon (fruit and  juice). Meats High-fat meats, fish, and poultry. This includes hot dogs, ribs, ham, sausage, salami, and bacon. Dairy Whole milk and chocolate milk. Sour cream. Cream. Butter. Ice cream. Cream cheese. Drinks Coffee and tea. Bubbly (carbonated) drinks or energy drinks. Condiments Hot sauce. Barbecue sauce. Sweets/Desserts Chocolate and cocoa. Donuts. Peppermint and spearmint. Fats and Oils High-fat foods. This includes [place] fries and potato chips. Other Vinegar. Strong spices. This includes black pepper, white pepper, red pepper, cayenne, curry powder, cloves, ginger, and chili powder. The items listed above may not be a complete list of foods and drinks to avoid. Contact your dietitian for more information. This information is not intended to replace advice given to you by your health care provider. Make sure you discuss any questions you have with your health care provider. Document Released: 10/04/2011 Document Revised: 09/10/2015 Document Reviewed: 02/06/2013 Elsevier Interactive Patient Education  2017 [employer]

## 2016-11-02 NOTE — Progress Notes (Signed)
Subjective:  Patient ID: Preston [name], male    DOB: March 19, 1981  Age: [age] y.o. MRN: [id number]  CC: Follow-up   HPI Preston [name] presents for follow up for anxiety, depression, and GERD. History of anxiety and depression.  He has the following symptoms: chest pain, psychomotor agitation, racing thoughts, diaphoresis. Onset of symptoms was approximately 1 1/2 year ago, gradually improving since that time. He denies current suicidal and homicidal ideation. Family history significant for depressed mood- father.Possible organic causes contributing are: none. Risk factors: previous episode of depression Previous treatment includes BuSpar. He complains of the following side effects from the treatment: none. He is agreeable to speaking with the LCSW at this time. History of GERD. Currently seeing gastroenterologist specialist. This has been associated with belching, bilious reflux and heartburn. Symptoms have been present for several years. He denies dysphagia.  He has not lost weight. He denies melena, hematochezia, hematemesis, and coffee ground emesis. Medical therapy in the past has included proton pump inhibitors.   Outpatient Medications Prior to Visit  Medication Sig Dispense Refill  . pantoprazole (PROTONIX) 40 MG tablet Take 1 tablet (40 mg total) by mouth daily. 30 tablet 3  . busPIRone (BUSPAR) 10 MG tablet TAKE 1 TABLET(10 MG) BY MOUTH THREE TIMES DAILY 90 tablet 0  . cetirizine (ZYRTEC ALLERGY) 10 MG tablet Take 1 tablet (10 mg total) by mouth daily. 30 tablet 1  . meloxicam (MOBIC) 15 MG tablet Take 1 tablet (15 mg total) by mouth daily. (Patient not taking: Reported on 11/02/2016) 30 tablet 1   No facility-administered medications prior to visit.     ROS Review of Systems  Constitutional: Negative.   Respiratory: Negative.   Cardiovascular: Negative.   Gastrointestinal: Negative.   Psychiatric/Behavioral: Negative.  Negative for suicidal ideas.    Objective:  BP 106/73 (BP  Location: Left Arm, Patient Position: Sitting, Cuff Size: Normal)   Pulse 77   Temp 98.2 F (36.8 C) (Oral)   Resp 18   Ht 6\' 5"  (1.956 [name])   Wt (!) 331 lb 9.6 oz (150.4 kg)   SpO2 95%   BMI 39.32 kg/[name]   BP/Weight 11/02/2016 12/13/2015 11/20/2015  Systolic BP 106 124 132  Diastolic BP 73 79 83  Wt. (Lbs) 331.6 310 317.5  BMI 39.32 36.76 37.65   Physical Exam  Constitutional: He appears well-developed and well-nourished.  HENT:  Head: Normocephalic and atraumatic.  Right Ear: External ear normal.  Left Ear: External ear normal.  Nose: Nose normal.  Mouth/Throat: Oropharynx is clear and moist.  Eyes: Pupils are equal, round, and reactive to light. Conjunctivae are normal.  Neck: No JVD present.  Cardiovascular: Normal rate, regular rhythm, normal heart sounds and intact distal pulses.   Pulmonary/Chest: Effort normal and breath sounds normal.  Abdominal: Soft. Bowel sounds are normal.  Skin: Skin is warm and dry.  Psychiatric: He has a normal mood and affect. He expresses no homicidal and no suicidal ideation. He expresses no suicidal plans and no homicidal plans.  Nursing note and vitals reviewed.  Assessment & Plan:   Problem List Items Addressed This Visit      Other   Anxiety - Primary   LCSW spoke with patient and provided resources.   Relevant Medications   busPIRone (BUSPAR) 10 MG tablet    Other Visit Diagnoses    Chronic GERD       Relevant Orders   H. pylori breath test   Depression, unspecified depression type  LCSW spoke with patient and provided resources.   Relevant Medications   busPIRone (BUSPAR) 10 MG tablet   Healthcare maintenance       Relevant Orders   Tdap vaccine greater than or equal to 7yo IM (Completed)      Meds ordered this encounter  Medications  . busPIRone (BUSPAR) 10 MG tablet    Sig: TAKE 1 TABLET(10 MG) BY MOUTH THREE TIMES DAILY    Dispense:  90 tablet    Refill:  3    Must have office visit for refills    Order  Specific Question:   Supervising Provider    Answer:   Quentin Angst [[id number]]    Follow-up: Return in about 3 months (around 02/02/2017) for Anxiety/Depression.   Lizbeth Bark FNP

## 2016-11-02 NOTE — BH Specialist Note (Signed)
Integrated Behavioral Health Initial Visit  MRN: [id number] Name: Preston DagoBrandon M Kratzke   Session Start time: 3:35 PM Session End time: 4:05 PM Total time: 30 minutes  Type of Service: Integrated Behavioral Health- Individual/Family Interpretor:Yes.   Interpretor Name and Language: NA   Warm Hand Off Completed.       SUBJECTIVE: Preston [name] is a 36 y.o. male accompanied by patient. Patient was referred by FNP Hairston for anxiety and depression. Patient reports the following symptoms/concerns: difficulty sleeping, feelings of worry, irritability, and panic attacks Duration of problem: 2016; Severity of problem: moderate  OBJECTIVE: Mood: Anxious and Affect: Appropriate Risk of harm to self or others: No plan to harm self or others   LIFE CONTEXT: Family and Social: Pt receives support from family and friends. 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 Life Changes: None reported  GOALS ADDRESSED: Patient will reduce symptoms of: anxiety and depression and increase knowledge and/or ability of: coping skills and also: Increase adequate support systems for patient/family   INTERVENTIONS: Mindfulness or Relaxation Training, Supportive Counseling, Psychoeducation and/or Health Education and Link to [facility]  Standardized Assessments completed: GAD-7 and PHQ 2&9  ASSESSMENT: Patient currently experiencing depression and anxiety. He reports difficulty sleeping, feelings of worry, irritability, and panic attacks. Patient receives strong support from family and friends. Patient may benefit from psychotherapy. He currently participates in medication management through PCP. LCSWA discussed benefits of applying healthy  coping skills to decrease symptoms. Pt was successful in identifying healthy strategies to implement daily, including compliance with medication management. LCSWA provided patient with emotional well-being applications for pt's smart phone to assist in managing and monitoring mental health and community resources for crisis intervention and psychotherapy.   PLAN: 1. Follow up with behavioral health clinician on : Pt was encouraged to contact LCSWA if symptoms worsen or fail to improve to [facility] 2. Behavioral recommendations: LCSWA recommends that pt apply healthy coping skills discussed and utilize provided resources. Pt is encouraged to schedule follow up appointment with LCSWA 3. Referral(s): Community Mental Health Services (LME/Outside Clinic) 4. "From scale of 1-10, how likely are you to follow plan?": 8/10  Bridgett LarssonJasmine D Lewis, LCSW 11/04/16 3:48 PM

## 2016-11-02 NOTE — Progress Notes (Signed)
Patient is here for depression anxiety acid reflux

## 2016-11-08 LAB — PLEASE NOTE

## 2016-11-08 LAB — H. PYLORI BREATH TEST: H PYLORI BREATH TEST: NEGATIVE

## 2016-11-10 ENCOUNTER — Telehealth: Payer: Self-pay

## 2016-11-10 NOTE — Telephone Encounter (Signed)
-----   Message from Lizbeth BarkMandesia R Hairston, FNP sent at 11/08/2016  2:08 PM EDT ----- -H.pylori is negative. H.pylori is a bacteria that can infect the stomach and cause stomach ulcers.

## 2016-11-10 NOTE — Telephone Encounter (Signed)
CMA call regarding lab results   Patient verify DOB  Patient was aware and understood  

## 2016-11-25 MED FILL — busPIRone HCL 10 MG TABS: 10 | 30 days supply | Qty: 30 | Fill #1

## 2017-01-11 MED FILL — busPIRone HCL 10 MG TABS: 10 | 30 days supply | Qty: 90 | Fill #2

## 2017-02-02 ENCOUNTER — Ambulatory Visit: Payer: Medicaid Other | Admitting: Family Medicine

## 2017-04-03 ENCOUNTER — Emergency Department (HOSPITAL_COMMUNITY)
Admission: EM | Admit: 2017-04-03 | Discharge: 2017-04-04 | Disposition: A | Discharge status: Home / Self Care | Payer: Self-pay | Attending: Emergency Medicine | Admitting: Emergency Medicine

## 2017-04-03 ENCOUNTER — Encounter (HOSPITAL_COMMUNITY): Payer: Self-pay

## 2017-04-03 DIAGNOSIS — Z202 Contact with and (suspected) exposure to infections with a predominantly sexual mode of transmission: Secondary | ICD-10-CM | POA: Insufficient documentation

## 2017-04-03 DIAGNOSIS — Z5321 Procedure and treatment not carried out due to patient leaving prior to being seen by health care provider: Secondary | ICD-10-CM | POA: Insufficient documentation

## 2017-04-03 LAB — URINALYSIS, ROUTINE W REFLEX MICROSCOPIC
Bilirubin Urine: NEGATIVE
Glucose, UA: NEGATIVE mg/dL
Hgb urine dipstick: NEGATIVE
KETONES UR: NEGATIVE mg/dL
LEUKOCYTES UA: NEGATIVE
NITRITE: NEGATIVE
PH: 5 (ref 5.0–8.0)
Protein, ur: NEGATIVE mg/dL
SPECIFIC GRAVITY, URINE: 1.012 (ref 1.005–1.030)

## 2017-04-03 NOTE — ED Triage Notes (Signed)
Pt states that GF has Tric and he wants to be tested as well. Denies dysuria or discharge

## 2017-04-04 NOTE — ED Notes (Signed)
Pt does not respond in waiting area x 3

## 2017-10-25 MED FILL — PANTOPRAZOLE SOD DR 40 MG T: 40 | 30 days supply | Qty: 30 | Fill #0

## 2017-11-21 ENCOUNTER — Other Ambulatory Visit: Payer: Self-pay

## 2017-11-21 ENCOUNTER — Encounter (HOSPITAL_COMMUNITY): Payer: Self-pay

## 2017-11-21 ENCOUNTER — Emergency Department (HOSPITAL_COMMUNITY)
Admission: EM | Admit: 2017-11-21 | Discharge: 2017-11-21 | Disposition: A | Discharge status: Home / Self Care | Payer: Self-pay | Attending: Emergency Medicine | Admitting: Emergency Medicine

## 2017-11-21 DIAGNOSIS — Z87891 Personal history of nicotine dependence: Secondary | ICD-10-CM | POA: Insufficient documentation

## 2017-11-21 DIAGNOSIS — Z79899 Other long term (current) drug therapy: Secondary | ICD-10-CM | POA: Insufficient documentation

## 2017-11-21 DIAGNOSIS — N3000 Acute cystitis without hematuria: Secondary | ICD-10-CM | POA: Insufficient documentation

## 2017-11-21 LAB — RAPID HIV SCREEN (HIV 1/2 AB+AG)
HIV 1/2 Antibodies: NONREACTIVE
HIV-1 P24 ANTIGEN - HIV24: NONREACTIVE

## 2017-11-21 LAB — URINALYSIS, ROUTINE W REFLEX MICROSCOPIC
BACTERIA UA: NONE SEEN
Bilirubin Urine: NEGATIVE
Glucose, UA: NEGATIVE mg/dL
Hgb urine dipstick: NEGATIVE
Ketones, ur: NEGATIVE mg/dL
NITRITE: NEGATIVE
PROTEIN: NEGATIVE mg/dL
SPECIFIC GRAVITY, URINE: 1.019 (ref 1.005–1.030)
pH: 5 (ref 5.0–8.0)

## 2017-11-21 MED ORDER — CEFTRIAXONE SODIUM 250 MG IJ SOLR
250.0000 mg | Freq: Once | INTRAMUSCULAR | Status: AC
  Administered 2017-11-21: 250 mg via INTRAMUSCULAR
  Filled 2017-11-21: qty 250

## 2017-11-21 MED ORDER — AZITHROMYCIN 1 G PO PACK
1.0000 g | PACK | Freq: Once | ORAL | Status: AC
  Administered 2017-11-21: 1 g via ORAL
  Filled 2017-11-21: qty 1

## 2017-11-21 NOTE — Discharge Instructions (Signed)
You have a urinary tract infection.  This is likely due to chlamydia or gonorrhea.  You were treated for both chlamydia and gonorrhea in the emergency department today.  You have testing which was sent to the lab for chlamydia, gonorrhea, HIV and syphilis.  You will receive a phone call in the next 2 to 3 days if any of these results are positive.  If positive you will need to inform all sexual partners so that they can be treated.  Information to the health department as listed below.  Return to the emergency department if you have any new or concerning symptoms like fever, testicular pain or swelling, abdominal pain, vomiting.

## 2017-11-21 NOTE — ED Notes (Signed)
Escorted provider with exam.

## 2017-11-21 NOTE — ED Triage Notes (Signed)
Patient c/o penile pain and burning x 6 days. Patient denies any penile drainage.

## 2017-11-21 NOTE — ED Provider Notes (Signed)
[facility]-EMERGENCY DEPT Provider Note   CSN: [id number] Arrival date & time: 11/21/17  1326     History   Chief Complaint Chief Complaint  Patient presents with  . Penis Pain    HPI Preston [name] is a 37 y.o. male.  HPI  Preston [name] is a 37 year old male with a history of anxiety, depression and gastroparesis who presents to the emergency department for evaluation of dysuria.  Patient reports that he is sexually active with 4 male partners, denies regular condom use.  States that over the past 4 days he has had burning sensation with urination at the tip of the penis.  He denies fever, chills, penile discharge, flank pain, urinary frequency, hematuria, abdominal pain, nausea/vomiting, testicular pain/swelling, penile or testicular sore, painful bowel movements.  He reports having an STD in the past with similar symptoms.  Is unsure if one of his male partners was exposed to an STD.  Past Medical History:  Diagnosis Date  . Acid reflux   . Anxiety   . Depression   . Gastroparesis     Patient Active Problem List   Diagnosis Date Noted  . Obesity 09/30/2015  . Pain in the chest 11/17/2014  . Anxiety 11/17/2014  . GERD (gastroesophageal reflux disease) 11/17/2014  . Adjustment disorder with mixed anxiety and depressed mood 10/07/2014    Past Surgical History:  Procedure Laterality Date  . ESOPHAGOGASTRODUODENOSCOPY    . no past surgeries          Home Medications    Prior to Admission medications   Medication Sig Start Date End Date Taking? Authorizing Provider  busPIRone (BUSPAR) 10 MG tablet TAKE 1 TABLET(10 MG) BY MOUTH THREE TIMES DAILY 11/02/16   Lizbeth Bark, FNP  pantoprazole (PROTONIX) 40 MG tablet Take 1 tablet (40 mg total) by mouth daily. 03/28/16   Hoy Register, MD    Family History Family History  Problem Relation Age of Onset  . Cancer Father   . Diabetes Father   . Heart attack Other     Social  History Social History   Tobacco Use  . Smoking status: Former Smoker    Packs/day: 0.00    Types: Cigars    Last attempt to quit: 09/06/2014    Years since quitting: 3.2  . Smokeless tobacco: Never Used  Substance Use Topics  . Alcohol use: Yes    Alcohol/week: 0.6 - 1.2 oz    Types: 1 - 2 Cans of beer per week    Comment: occasional use  . Drug use: Yes    Frequency: 3.5 times per week    Types: Marijuana    Comment: occasional     Allergies   Other; Red dye; and Yellow dyes (non-tartrazine)   Review of Systems Review of Systems Constitutional: Negative for chills and fever.  Gastrointestinal: Negative for abdominal pain, diarrhea, nausea and vomiting.  Genitourinary: Positive for dysuria. Negative for discharge, flank pain, frequency, hematuria, penile pain, scrotal swelling and testicular pain.  Musculoskeletal: Negative for back pain.  Skin: Negative for wound.  Psychiatric/Behavioral: Negative for agitation.    Physical Exam Updated Vital Signs BP 139/87 (BP Location: Left Arm)   Pulse 93   Temp 97.9 F (36.6 C) (Oral)   Resp 16   Ht 6\' 5"  (1.956 m)   Wt 131.5 kg (290 lb)   SpO2 93%   BMI 34.39 kg/m   Physical Exam Constitutional: He is oriented to person, place, and time. He  appears well-developed and well-nourished. No distress.  NAD, non-toxic  HENT:  Head: Normocephalic and atraumatic.  Eyes: Right eye exhibits no discharge. Left eye exhibits no discharge.  Pulmonary/Chest: Effort normal. No respiratory distress.  Abdominal: Soft. Bowel sounds are normal. There is no tenderness.  No CVA tenderness. Genitourinary:  Genitourinary Comments: Chaperone present for exam. No discharge from penis. No lesion, sore or erythema on the penis or testicles. The penis and testicles are nontender. No testicular masses or swelling.  Musculoskeletal: Normal range of motion.  Neurological: He is alert and oriented to person, place, and time. Coordination normal.   Skin: Skin is warm and dry. He is not diaphoretic.  Psychiatric: He has a normal mood and affect. His behavior is normal.  Nursing note and vitals reviewed.   ED Treatments / Results  Labs (all labs ordered are listed, but only abnormal results are displayed) Labs Reviewed  URINALYSIS, ROUTINE W REFLEX MICROSCOPIC - Abnormal; Notable for the following components:      Result Value   Leukocytes, UA SMALL (*)    WBC, UA >50 (*)    All other components within normal limits  URINE CULTURE  RAPID HIV SCREEN (HIV 1/2 AB+AG)  RPR  GC/CHLAMYDIA PROBE AMP ([place]) NOT AT [facility]    EKG None  Radiology No results found.  Procedures Procedures (including critical care time)  Medications Ordered in ED Medications  azithromycin (ZITHROMAX) powder 1 g (has no administration in time range)  cefTRIAXone (ROCEPHIN) injection 250 mg (has no administration in time range)     Initial Impression / Assessment and Plan / ED Course  I have reviewed the triage vital signs and the nursing notes.  Pertinent labs & imaging results that were available during my care of the patient were reviewed by me and considered in my medical decision making (see chart for details).  Patient presents with dysuria.  Sexually active with several male partners, denies regular condom use.  UA with >50WBCs, no bacteria.  Symptoms likely d/t GC/chlamydia infection.  Patient treated prophylactically with Rocephin and azithromycin in the emergency department.  No testicular pain on exam to suggest orchitis or epididymitis.  No painful bowel movements to suggest prostatitis.  No CVA tenderness or fever and I do not suspect pyelonephritis.  Patient understands that he has GC/chlamydia, syphilis and HIV testing which is pending.  If positive he will receive a phone call and need to inform all sexual partners so that they can be treated.  I counseled him on strict return precautions and he agrees and voiced understanding  to the above plan and appears reliable for follow-up.  Final Clinical Impressions(s) / ED Diagnoses   Final diagnoses:  Acute cystitis without hematuria    ED Discharge Orders    None       Lawrence MarseillesShrosbree, Shandria Clinch J, PA-C 11/21/17 1739    Donnetta Hutchingook, Brian, MD 11/22/17 [phone]

## 2017-11-22 LAB — URINE CULTURE: Culture: NO GROWTH

## 2017-11-22 LAB — GC/CHLAMYDIA PROBE AMP (~~LOC~~) NOT AT ARMC
Chlamydia: POSITIVE — AB
NEISSERIA GONORRHEA: NEGATIVE

## 2017-11-22 LAB — RPR: RPR Ser Ql: NONREACTIVE

## 2017-11-24 MED FILL — PANTOPRAZOLE SOD DR 40 MG T: 40 | 30 days supply | Qty: 30 | Fill #1

## 2017-12-28 MED FILL — PANTOPRAZOLE SOD DR 40 MG T: 40 | 30 days supply | Qty: 30 | Fill #2

## 2018-02-13 MED FILL — PANTOPRAZOLE SOD DR 40 MG T: 40 | 30 days supply | Qty: 30 | Fill #3

## 2018-03-23 MED FILL — PANTOPRAZOLE SOD DR 40 MG T: 40 | 30 days supply | Qty: 30 | Fill #4

## 2018-04-25 MED FILL — PANTOPRAZOLE SOD DR 40 MG T: 40 | 30 days supply | Qty: 30 | Fill #5

## 2018-05-31 MED FILL — PANTOPRAZOLE SOD DR 40 MG T: 40 | 30 days supply | Qty: 30 | Fill #6

## 2018-07-03 MED FILL — PANTOPRAZOLE SOD DR 40 MG T: 40 | 90 days supply | Qty: 90 | Fill #0

## 2018-10-22 MED FILL — PANTOPRAZOLE SOD DR 20 MG T: 20 | 30 days supply | Qty: 30 | Fill #0

## 2018-11-23 MED FILL — PANTOPRAZOLE SOD DR 20 MG T: 20 | 30 days supply | Qty: 30 | Fill #1

## 2018-12-25 MED FILL — PANTOPRAZOLE SOD DR 20 MG T: 20 | 30 days supply | Qty: 30 | Fill #2

## 2019-01-30 MED FILL — PANTOPRAZOLE SOD DR 20 MG T: 20 | 30 days supply | Qty: 30 | Fill #3

## 2019-03-01 MED FILL — PANTOPRAZOLE SOD DR 20 MG T: 20 | 30 days supply | Qty: 30 | Fill #4

## 2019-04-09 MED FILL — PANTOPRAZOLE SOD DR 20 MG T: 20 | 30 days supply | Qty: 30 | Fill #5

## 2019-05-13 MED FILL — PANTOPRAZOLE SOD DR 20 MG T: 20 | 30 days supply | Qty: 30 | Fill #6

## 2019-06-14 MED FILL — PANTOPRAZOLE SOD DR 20 MG T: 20 | 30 days supply | Qty: 30 | Fill #7

## 2019-07-22 MED FILL — PANTOPRAZOLE SOD DR 20 MG T: 20 | 30 days supply | Qty: 30 | Fill #8

## 2019-10-31 MED FILL — PANTOPRAZOLE SOD DR 20 MG T: 20 | 30 days supply | Qty: 30 | Fill #1

## 2019-11-11 ENCOUNTER — Ambulatory Visit: Payer: Medicaid Other | Attending: Internal Medicine

## 2019-11-11 DIAGNOSIS — Z20822 Contact with and (suspected) exposure to covid-19: Secondary | ICD-10-CM | POA: Insufficient documentation

## 2019-11-12 LAB — SARS-COV-2, NAA 2 DAY TAT

## 2019-11-12 LAB — NOVEL CORONAVIRUS, NAA: SARS-CoV-2, NAA: NOT DETECTED

## 2019-11-29 ENCOUNTER — Other Ambulatory Visit: Payer: Self-pay | Admitting: Physician Assistant

## 2019-11-29 MED FILL — PANTOPRAZOLE SOD DR 20 MG T: 20 | 30 days supply | Qty: 30 | Fill #2

## 2019-11-29 NOTE — Telephone Encounter (Signed)
Medication: pantoprazole (PROTONIX) 40 MG tablet [[id number]]   Has the patient contacted their pharmacy? YES (Agent: If no, request that the patient contact the pharmacy for the refill.) (Agent: If yes, when and what did the pharmacy advise?)  Preferred Pharmacy (with phone number or street name): [facility] - [place], [place] - [place] [address] [place] [address] Phone: [phone] Fax: [phone] Hours: Not open 24 hours    Agent: Please be advised that RX refills may take up to 3 business days. We ask that you follow-up with your pharmacy.

## 2019-11-29 NOTE — Telephone Encounter (Signed)
Requested medication (s) are due for refill today: no  Requested medication (s) are on the active medication list: yes  Last refill:  03/25/2016  Future visit scheduled: no  Notes to clinic:  patient overdue for follow up  Requested Prescriptions  Pending Prescriptions Disp Refills   pantoprazole (PROTONIX) 40 MG tablet 30 tablet 3    Sig: Take 1 tablet (40 mg total) by mouth daily.      Gastroenterology: Proton Pump Inhibitors Failed - 11/29/2019 12:50 PM      Failed - Valid encounter within last 12 months    Recent Outpatient Visits           3 years ago Anxiety   [facility] [place], [place], [place]   4 years ago Knee pain, acute, left   [facility] [place], [place], [place]   4 years ago Anxiety   [facility] [place], Odette Horns, MD   4 years ago Anxiety   [facility] [name], NP   4 years ago Anxiety   [facility] [name], NP

## 2019-12-20 ENCOUNTER — Other Ambulatory Visit: Payer: Self-pay

## 2019-12-20 ENCOUNTER — Ambulatory Visit (INDEPENDENT_AMBULATORY_CARE_PROVIDER_SITE_OTHER): Payer: Self-pay | Admitting: Primary Care

## 2019-12-20 ENCOUNTER — Encounter (INDEPENDENT_AMBULATORY_CARE_PROVIDER_SITE_OTHER): Payer: Self-pay | Admitting: Primary Care

## 2019-12-20 VITALS — BP 135/96 | HR 84 | Temp 98.2°F | Resp 16 | Ht 77.0 in | Wt 355.0 lb

## 2019-12-20 DIAGNOSIS — R030 Elevated blood-pressure reading, without diagnosis of hypertension: Secondary | ICD-10-CM

## 2019-12-20 DIAGNOSIS — Z0001 Encounter for general adult medical examination with abnormal findings: Secondary | ICD-10-CM

## 2019-12-20 DIAGNOSIS — Z7689 Persons encountering health services in other specified circumstances: Secondary | ICD-10-CM

## 2019-12-20 DIAGNOSIS — Z1159 Encounter for screening for other viral diseases: Secondary | ICD-10-CM

## 2019-12-20 DIAGNOSIS — Z113 Encounter for screening for infections with a predominantly sexual mode of transmission: Secondary | ICD-10-CM

## 2019-12-20 NOTE — Patient Instructions (Signed)

## 2019-12-20 NOTE — Progress Notes (Signed)
Here for establish care  

## 2019-12-20 NOTE — Progress Notes (Signed)
New Patient Office Visit  Subjective:  Patient ID: Preston [name], male    DOB: 1980-07-05  Age: 39 y.o. MRN: [id number]  CC: No chief complaint on file.   HPI Mr. Preston [name] presents for annual visit and he has concerns with wanting STD testing.  Past Medical History:  Diagnosis Date  . Acid reflux   . Anxiety   . Depression   . Gastroparesis     Past Surgical History:  Procedure Laterality Date  . ESOPHAGOGASTRODUODENOSCOPY    . no past surgeries      Family History  Problem Relation Age of Onset  . Cancer Father   . Diabetes Father   . Heart attack Other     Social History   Socioeconomic History  . Marital status: Single    Spouse name: Not on file  . Number of children: Not on file  . Years of education: Not on file  . Highest education level: Not on file  Occupational History  . Not on file  Tobacco Use  . Smoking status: Former Smoker    Packs/day: 0.00    Types: Cigars    Quit date: 09/06/2014    Years since quitting: 5.2  . Smokeless tobacco: Never Used  Vaping Use  . Vaping Use: Never used  Substance and Sexual Activity  . Alcohol use: Yes    Alcohol/week: 1.0 - 2.0 standard drink    Types: 1 - 2 Cans of beer per week    Comment: occasional use  . Drug use: Yes    Frequency: 3.5 times per week    Types: Marijuana    Comment: occasional  . Sexual activity: Yes    Birth control/protection: Condom  Other Topics Concern  . Not on file  Social History Narrative  . Not on file   Social Determinants of Health   Financial Resource Strain:   . Difficulty of Paying Living Expenses: Not on file  Food Insecurity:   . Worried About Programme researcher, broadcasting/film/video in the Last Year: Not on file  . Ran Out of Food in the Last Year: Not on file  Transportation Needs:   . Lack of Transportation (Medical): Not on file  . Lack of Transportation (Non-Medical): Not on file  Physical Activity:   . Days of Exercise per Week: Not on file  . Minutes of  Exercise per Session: Not on file  Stress:   . Feeling of Stress : Not on file  Social Connections:   . Frequency of Communication with Friends and Family: Not on file  . Frequency of Social Gatherings with Friends and Family: Not on file  . Attends Religious Services: Not on file  . Active Member of Clubs or Organizations: Not on file  . Attends Banker Meetings: Not on file  . Marital Status: Not on file  Intimate Partner Violence:   . Fear of Current or Ex-Partner: Not on file  . Emotionally Abused: Not on file  . Physically Abused: Not on file  . Sexually Abused: Not on file    ROS Review of Systems  All other systems reviewed and are negative.   Objective:   Today's Vitals: BP (!) 135/96   Pulse 84   Temp 98.2 F (36.8 C)   Resp 16   Ht 6\' 5"  (1.956 m)   Wt (!) 355 lb (161 kg)   SpO2 100%   BMI 42.10 kg/m   Physical Exam Vitals:   12/20/19  1051  BP: (!) 135/96  Pulse: 84  Resp: 16  Temp: 98.2 F (36.8 C)  SpO2: 100%  Weight: (!) 355 lb (161 kg)  Height: 6\' 5"  (1.956 m)   General: Vital signs reviewed.  Patient is well-developed and well-nourished, morbid obesity Body mass index is 42.1 kg/m.in no acute distress and cooperative with exam.  Head: Normocephalic and atraumatic. Eyes: EOMI, conjunctivae normal, no scleral icterus.  Neck: Supple, trachea midline, normal ROM, no JVD, masses, thyromegaly, or carotid bruit present.  Cardiovascular: RRR, S1 normal, S2 normal, no murmurs, gallops, or rubs. Pulmonary/Chest: Clear to auscultation bilaterally, no wheezes, rales, or rhonchi. Abdominal: Soft, non-tender, non-distended, BS +, no masses, organomegaly, or guarding present.  Musculoskeletal: No joint deformities, erythema, or stiffness, ROM full and nontender. Extremities: No lower extremity edema bilaterally,  pulses symmetric and intact bilaterally. No cyanosis or clubbing. Neurological: A&O x3, Strength is normal and symmetric bilaterally,  cranial nerve II-XII are grossly intact, no focal motor deficit, sensory intact to light touch bilaterally.  Skin: Warm, dry and intact. No rashes or erythema. Psychiatric: Normal mood and affect. speech and behavior is normal. Cognition and memory are normal.  Assessment & Plan:  Diagnoses and all orders for this visit:  Annual visit for general adult medical examination with abnormal findings  Encounter to establish care , NP-C will be your  (PCP) she is mastered prepared . She is skilled to diagnosed and treat illness. Also able to answer health concern as well as continuing care of varied medical conditions, not limited by cause, organ system, or diagnosis.   Screening for STD (sexually transmitted disease) -     RPR -     Cancel: Urine cytology ancillary only -     Chlamydia/Gonococcus/Trichomonas, NAA  Need for hepatitis C screening test -     Hepatitis C Antibody  Elevated blood pressure reading without diagnosis of hypertension Counseled on blood pressure goal of less than 130/80, low-sodium, DASH diet, medication compliance, 150 minutes of moderate intensity exercise per week.  Outpatient Encounter Medications as of 12/20/2019  Medication Sig  . busPIRone (BUSPAR) 10 MG tablet TAKE 1 TABLET(10 MG) BY MOUTH THREE TIMES DAILY  . pantoprazole (PROTONIX) 40 MG tablet Take 1 tablet (40 mg total) by mouth daily.   No facility-administered encounter medications on file as of 12/20/2019.    Follow-up: Return if symptoms worsen or fail to improve.   02/19/2020, NP

## 2019-12-21 LAB — RPR: RPR Ser Ql: NONREACTIVE

## 2019-12-21 LAB — HEPATITIS C ANTIBODY: Hep C Virus Ab: <0.1 s/co ratio (ref 0.0–0.9)

## 2019-12-22 LAB — CHLAMYDIA/GONOCOCCUS/TRICHOMONAS, NAA
Chlamydia by NAA: NEGATIVE
Gonococcus by NAA: NEGATIVE
Trich vag by NAA: NEGATIVE

## 2019-12-27 ENCOUNTER — Telehealth: Payer: Self-pay

## 2019-12-27 NOTE — Telephone Encounter (Signed)
Pt. Given results, verbalizes understanding. 

## 2019-12-31 ENCOUNTER — Other Ambulatory Visit (INDEPENDENT_AMBULATORY_CARE_PROVIDER_SITE_OTHER): Payer: Self-pay | Admitting: Primary Care

## 2019-12-31 NOTE — Telephone Encounter (Signed)
Medication Refill - Medication: pantoprazole (PROTONIX) 40 MG tablet    Has the patient contacted their pharmacy? Yes.   (Agent: If no, request that the patient contact the pharmacy for the refill.) (Agent: If yes, when and what did the pharmacy advise?)  Preferred Pharmacy (with phone number or street name):  [facility] - [place], [place] - [place] [address]  [address] [place] [address]  Phone: [phone] Fax: [phone]    Agent: Please be advised that RX refills may take up to 3 business days. We ask that you follow-up with your pharmacy.

## 2019-12-31 NOTE — Telephone Encounter (Signed)
Requested medication (s) are due for refill today: yes  Requested medication (s) are on the active medication list: yes  Last refill:  03/28/2016  Future visit scheduled: no  Notes to clinic:  pt goes to [facility] and had a CPE 12/20/19 Med last ordered at [facility]   Requested Prescriptions  Pending Prescriptions Disp Refills   pantoprazole (PROTONIX) 40 MG tablet 30 tablet 3    Sig: Take 1 tablet (40 mg total) by mouth daily.      Gastroenterology: Proton Pump Inhibitors Failed - 12/31/2019  4:34 PM      Failed - Valid encounter within last 12 months    Recent Outpatient Visits           1 week ago Annual visit for general adult medical examination with abnormal findings   [facility] Grayce Sessions, NP   3 years ago Anxiety   [facility] [place], [place] R, FNP   4 years ago Knee pain, acute, left   [facility] [place], [place], [place]   4 years ago Anxiety   [facility] Hoy Register, MD   4 years ago Anxiety   [facility] [name], NP

## 2020-01-01 ENCOUNTER — Other Ambulatory Visit (INDEPENDENT_AMBULATORY_CARE_PROVIDER_SITE_OTHER): Payer: Self-pay | Admitting: Family Medicine

## 2020-01-01 MED ORDER — PANTOPRAZOLE SODIUM 40 MG PO TBEC: 40.0000 mg | DELAYED_RELEASE_TABLET | Freq: Every day | ORAL | 3 refills | Status: DC

## 2020-01-01 MED FILL — PANTOPRAZOLE SOD DR 40 MG T: 40 | 30 days supply | Qty: 30 | Fill #0

## 2020-01-31 MED FILL — PANTOPRAZOLE SOD DR 40 MG T: 40 | 30 days supply | Qty: 30 | Fill #1

## 2020-03-11 MED FILL — PANTOPRAZOLE SOD DR 40 MG T: 40 | 30 days supply | Qty: 30 | Fill #2

## 2020-04-14 MED FILL — PANTOPRAZOLE SOD DR 40 MG T: 40 | 30 days supply | Qty: 30 | Fill #3

## 2020-05-18 ENCOUNTER — Other Ambulatory Visit (INDEPENDENT_AMBULATORY_CARE_PROVIDER_SITE_OTHER): Payer: Self-pay | Admitting: Family Medicine

## 2020-05-18 MED FILL — PANTOPRAZOLE SOD DR 40 MG T: 40 | 30 days supply | Qty: 30 | Fill #0

## 2020-06-05 ENCOUNTER — Emergency Department (HOSPITAL_COMMUNITY)
Admission: EM | Admit: 2020-06-05 | Discharge: 2020-06-05 | Disposition: A | Discharge status: Home / Self Care | Payer: Medicaid Other | Attending: Emergency Medicine | Admitting: Emergency Medicine

## 2020-06-05 ENCOUNTER — Encounter (HOSPITAL_COMMUNITY): Payer: Self-pay | Admitting: Emergency Medicine

## 2020-06-05 ENCOUNTER — Other Ambulatory Visit (HOSPITAL_COMMUNITY): Payer: Self-pay | Admitting: Physician Assistant

## 2020-06-05 ENCOUNTER — Other Ambulatory Visit: Payer: Self-pay

## 2020-06-05 DIAGNOSIS — Z87891 Personal history of nicotine dependence: Secondary | ICD-10-CM | POA: Insufficient documentation

## 2020-06-05 DIAGNOSIS — Z202 Contact with and (suspected) exposure to infections with a predominantly sexual mode of transmission: Secondary | ICD-10-CM | POA: Insufficient documentation

## 2020-06-05 LAB — URINALYSIS, COMPLETE (UACMP) WITH MICROSCOPIC
Bacteria, UA: NONE SEEN
Bilirubin Urine: NEGATIVE
Glucose, UA: NEGATIVE mg/dL
Hgb urine dipstick: NEGATIVE
Ketones, ur: NEGATIVE mg/dL
Nitrite: NEGATIVE
Protein, ur: NEGATIVE mg/dL
Specific Gravity, Urine: 1.023 (ref 1.005–1.030)
pH: 5 (ref 5.0–8.0)

## 2020-06-05 LAB — RAPID HIV SCREEN (HIV 1/2 AB+AG)
HIV 1/2 Antibodies: NONREACTIVE
HIV-1 P24 Antigen - HIV24: NONREACTIVE

## 2020-06-05 LAB — RPR: RPR Ser Ql: NONREACTIVE

## 2020-06-05 MED ORDER — METRONIDAZOLE 500 MG PO TABS: 500.0000 mg | ORAL_TABLET | Freq: Two times a day (BID) | ORAL | 0 refills | Status: DC

## 2020-06-05 MED FILL — metroNIDAZOLE 500 MG TABS: 500 | 7 days supply | Qty: 14 | Fill #0

## 2020-06-05 NOTE — ED Provider Notes (Signed)
[facility]-EMERGENCY DEPT Provider Note   CSN: [id number] Arrival date & time: 06/05/20  0149     History Chief Complaint  Patient presents with  . Exposure to STD    Preston [name] is a 40 y.o. male with no relevant past medical history presents the ED with complaints of STI exposure.  Patient reportedly was contacted by an ex who reports that she was positive for trichomoniasis.  Patient states that he has had unprotected sexual intercourse with 3 separate male partners in the past 6 months.  Patient denies any abdominal discomfort, penile rash, penile discharge, dysuria, pain with defecation, fevers or chills, testicular pain or swelling, or any other symptoms.    Patient is followed by [facility].  He states that he would also like to be tested for HIV and RPR.  HPI     Past Medical History:  Diagnosis Date  . Acid reflux   . Anxiety   . Depression   . Gastroparesis     Patient Active Problem List   Diagnosis Date Noted  . Obesity 09/30/2015  . Pain in the chest 11/17/2014  . Anxiety 11/17/2014  . GERD (gastroesophageal reflux disease) 11/17/2014  . Adjustment disorder with mixed anxiety and depressed mood 10/07/2014    Past Surgical History:  Procedure Laterality Date  . ESOPHAGOGASTRODUODENOSCOPY    . no past surgeries         Family History  Problem Relation Age of Onset  . Cancer Father   . Diabetes Father   . Heart attack Other     Social History   Tobacco Use  . Smoking status: Former Smoker    Packs/day: 0.00    Types: Cigars    Quit date: 09/06/2014    Years since quitting: 5.7  . Smokeless tobacco: Never Used  Vaping Use  . Vaping Use: Never used  Substance Use Topics  . Alcohol use: Yes    Alcohol/week: 1.0 - 2.0 standard drink    Types: 1 - 2 Cans of beer per week    Comment: occasional use  . Drug use: Yes    Frequency: 3.5 times per week    Types: Marijuana    Comment:  occasional    Home Medications Prior to Admission medications   Medication Sig Start Date End Date Taking? Authorizing Provider  metroNIDAZOLE (FLAGYL) 500 MG tablet Take 1 tablet (500 mg total) by mouth 2 (two) times daily. 06/05/20  Yes Lorelee New, PA-C  busPIRone (BUSPAR) 10 MG tablet TAKE 1 TABLET(10 MG) BY MOUTH THREE TIMES DAILY 11/02/16   Arrie Senate R, FNP  pantoprazole (PROTONIX) 40 MG tablet TAKE 1 TABLET (40 MG TOTAL) BY MOUTH DAILY. 05/18/20   Hoy Register, MD    Allergies    Other, Red dye, and Yellow dyes (non-tartrazine)  Review of Systems   Review of Systems  Constitutional: Negative for fever.  Gastrointestinal: Negative for abdominal pain and rectal pain.  Genitourinary: Negative for genital sores, penile discharge, penile pain, penile swelling, scrotal swelling and testicular pain.    Physical Exam Updated Vital Signs BP (!) 164/106 (BP Location: Right Arm)   Pulse 76   Temp 98 F (36.7 C) (Oral)   Resp 18   SpO2 100%   Physical Exam Vitals and nursing note reviewed. Exam conducted with a chaperone present.  Constitutional:      General: He is not in acute distress.    Appearance: Normal appearance. He  is not ill-appearing.  HENT:     Head: Normocephalic and atraumatic.  Eyes:     General: No scleral icterus.    Conjunctiva/sclera: Conjunctivae normal.  Cardiovascular:     Rate and Rhythm: Normal rate.  Pulmonary:     Effort: Pulmonary effort is normal. No respiratory distress.  Abdominal:     General: Abdomen is flat. There is no distension.     Palpations: Abdomen is soft.     Tenderness: There is no abdominal tenderness. There is no guarding.  Genitourinary:    Comments: Patient declined. Skin:    General: Skin is dry.  Neurological:     Mental Status: He is alert.     GCS: GCS eye subscore is 4. GCS verbal subscore is 5. GCS motor subscore is 6.  Psychiatric:        Mood and Affect: Mood normal.        Behavior: Behavior  normal.        Thought Content: Thought content normal.     ED Results / Procedures / Treatments   Labs (all labs ordered are listed, but only abnormal results are displayed) Labs Reviewed  URINALYSIS, COMPLETE (UACMP) WITH MICROSCOPIC - Abnormal; Notable for the following components:      Result Value   Leukocytes,Ua TRACE (*)    All other components within normal limits  RAPID HIV SCREEN (HIV 1/2 AB+AG)  RPR  GC/CHLAMYDIA PROBE AMP ([place]) NOT AT [facility]    EKG None  Radiology No results found.  Procedures Procedures   Medications Ordered in ED Medications - No data to display  ED Course  I have reviewed the triage vital signs and the nursing notes.  Pertinent labs & imaging results that were available during my care of the patient were reviewed by me and considered in my medical decision making (see chart for details).    MDM Rules/Calculators/A&P                          Preston [name] was evaluated in Emergency Department on 06/05/2020 for the symptoms described in the history of present illness. He was evaluated in the context of the global COVID-19 pandemic, which necessitated consideration that the patient might be at risk for infection with the SARS-CoV-2 virus that causes COVID-19. Institutional protocols and algorithms that pertain to the evaluation of patients at risk for COVID-19 are in a state of rapid change based on information released by regulatory bodies including the CDC and federal and state organizations. These policies and algorithms were followed during the patient's care in the ED.  I personally reviewed patient's medical chart and all notes from triage and staff during today's encounter. I have also ordered and reviewed all labs and imaging that I felt to be medically necessary in the evaluation of this patient's complaints and with consideration of their physical exam. If needed, translation services were available and utilized.   Patient is  afebrile without abdominal tenderness, abdominal pain or painful bowel movements to indicate prostatitis.  Patient declined genitourinary exam, but denied any GU symptoms.  STD cultures obtained including HIV, syphilis, gonorrhea and chlamydia. Patient to be discharged with instructions to follow up with PCP. Discussed importance of using protection when sexually active. Pt understands that they have GC/Chlamydia cultures pending and that they will need to inform all sexual partners if results return positive.   We will discharge patient home with Flagyl 500 mg twice daily  x7 days.  Final Clinical Impression(s) / ED Diagnoses Final diagnoses:  STD exposure    Rx / DC Orders ED Discharge Orders         Ordered    metroNIDAZOLE (FLAGYL) 500 MG tablet  2 times daily        06/05/20 0305           Lorelee New, PA-C 06/05/20 0305    Sabas Sous, MD 06/05/20 [phone]

## 2020-06-05 NOTE — ED Triage Notes (Signed)
Pt states that she was just called and informed that a previous partner has trich. Denies discharge, dysuria, or rashes. Last unprotected sex was 2 weeks ago.

## 2020-06-05 NOTE — Discharge Instructions (Addendum)
You are being treated for trichomoniasis exposure.  Please take your antibiotic, as prescribed.  Take with food.  Do not drink alcohol while taking the metronidazole antibiotic.  You have been tested today for gonorrhea and chlamydia. These results will be available in approximately 3 days. You may check your MyChart account for results. Please inform all sexual partners of positive results and that they should be tested and treated as well.  Please wait 2 weeks and be sure that you and your partners are symptom free before returning to sexual activity. Please use protection with every sexual encounter.  Follow Up: Please followup with your primary doctor in 3 days for discussion of your diagnoses and further evaluation after today's visit; if you do not have a primary care doctor use the resource guide provided to find one; Please return to the ER for worsening symptoms, high fevers or persistent vomiting.

## 2020-06-05 NOTE — ED Notes (Signed)
Pt declined recheck of vitals.

## 2020-06-08 ENCOUNTER — Telehealth: Payer: Self-pay | Admitting: *Deleted

## 2020-06-08 NOTE — Telephone Encounter (Signed)
Transition Care Management Unsuccessful Follow-up Telephone Call  Date of discharge and from where:  06/05/2020 - [facility]  Attempts:  1st Attempt  Reason for unsuccessful TCM follow-up call:  Voice mail full

## 2020-06-09 NOTE — Telephone Encounter (Signed)
Transition Care Management Follow-up Telephone Call  Date of discharge and from where: 06/05/2020 - [facility]  How have you been since you were released from the hospital? "Fine"  Any questions or concerns? No  Items Reviewed:  Did the pt receive and understand the discharge instructions provided? Yes   Medications obtained and verified? Yes   Other? N/A  Any new allergies since your discharge? No   Dietary orders reviewed? Yes  Do you have support at home? Yes   Home Care and Equipment/Supplies: Were home health services ordered? not applicable If so, what is the name of the agency? N/A  Has the agency set up a time to come to the patient's home? not applicable Were any new equipment or medical supplies ordered?  No What is the name of the medical supply agency? N/A Were you able to get the supplies/equipment? not applicable Do you have any questions related to the use of the equipment or supplies? No  Functional Questionnaire: (I = Independent and D = Dependent) ADLs: I  Bathing/Dressing- I  Meal Prep- I  Eating- I  Maintaining continence- I  Transferring/Ambulation- I  Managing Meds- I  Follow up appointments reviewed:   PCP Hospital f/u appt confirmed? No  - Advised pt that if issues like these arise again, he should really contact his PCP vs going to the ED  Specialist Hospital f/u appt confirmed? No    Are transportation arrangements needed? N/A  If their condition worsens, is the pt aware to call PCP or go to the Emergency Dept.? Yes  Was the patient provided with contact information for the PCP's office or ED? Yes  Was to pt encouraged to call back with questions or concerns? Yes

## 2020-06-23 MED FILL — PANTOPRAZOLE SOD DR 40 MG T: 40 | 30 days supply | Qty: 30 | Fill #1

## 2020-07-24 ENCOUNTER — Other Ambulatory Visit: Payer: Self-pay

## 2020-07-24 MED FILL — Pantoprazole Sodium EC Tab 40 MG (Base Equiv): ORAL | 30 days supply | Qty: 30 | Fill #0 | Status: AC

## 2020-08-28 ENCOUNTER — Other Ambulatory Visit: Payer: Self-pay

## 2020-08-28 MED FILL — Pantoprazole Sodium EC Tab 40 MG (Base Equiv): ORAL | 30 days supply | Qty: 30 | Fill #1 | Status: AC

## 2020-10-06 ENCOUNTER — Other Ambulatory Visit: Payer: Self-pay

## 2020-10-06 MED FILL — Pantoprazole Sodium EC Tab 40 MG (Base Equiv): ORAL | 30 days supply | Qty: 30 | Fill #2 | Status: AC

## 2020-11-09 ENCOUNTER — Other Ambulatory Visit: Payer: Self-pay

## 2020-11-09 MED FILL — Pantoprazole Sodium EC Tab 40 MG (Base Equiv): ORAL | 30 days supply | Qty: 30 | Fill #3 | Status: AC

## 2020-12-15 ENCOUNTER — Other Ambulatory Visit (INDEPENDENT_AMBULATORY_CARE_PROVIDER_SITE_OTHER): Payer: Self-pay | Admitting: Family Medicine

## 2020-12-15 ENCOUNTER — Other Ambulatory Visit: Payer: Self-pay

## 2020-12-15 NOTE — Telephone Encounter (Signed)
Requested medications are due for refill today.  yes  Requested medications are on the active medications list.  yes  Last refill. 05/18/2020  Future visit scheduled.   no  Notes to clinic.  Prescription was last signed by Dr. Alvis Lemmings.

## 2020-12-16 ENCOUNTER — Other Ambulatory Visit: Payer: Self-pay

## 2020-12-18 ENCOUNTER — Other Ambulatory Visit: Payer: Self-pay

## 2020-12-18 ENCOUNTER — Other Ambulatory Visit (INDEPENDENT_AMBULATORY_CARE_PROVIDER_SITE_OTHER): Payer: Self-pay | Admitting: Primary Care

## 2020-12-18 NOTE — Telephone Encounter (Signed)
[facility] is out of the med. Request short supply /Medication Refill - Medication: pantoprazole (PROTONIX) 40 MG tablet   Has the patient contacted their pharmacy? yes (Agent: If no, request that the patient contact the pharmacy for the refill.) (Agent: If yes, when and what did the pharmacy advise?)contact pcp  Preferred Pharmacy (with phone number or street name):  [facility] Phone:  [phone]  Fax:  [phone]      Agent: Please be advised that RX refills may take up to 3 business days. We ask that you follow-up with your pharmacy.

## 2020-12-19 MED ORDER — PANTOPRAZOLE SODIUM 40 MG PO TBEC
DELAYED_RELEASE_TABLET | Freq: Every day | ORAL | 0 refills | Status: DC
  Filled 2020-12-19: qty 30, 30d supply, fill #0

## 2020-12-22 ENCOUNTER — Other Ambulatory Visit: Payer: Self-pay

## 2020-12-25 ENCOUNTER — Other Ambulatory Visit: Payer: Self-pay

## 2020-12-25 ENCOUNTER — Encounter (INDEPENDENT_AMBULATORY_CARE_PROVIDER_SITE_OTHER): Payer: Self-pay | Admitting: Primary Care

## 2020-12-25 ENCOUNTER — Ambulatory Visit (INDEPENDENT_AMBULATORY_CARE_PROVIDER_SITE_OTHER): Payer: Self-pay | Admitting: Primary Care

## 2020-12-25 VITALS — BP 129/86 | HR 79 | Temp 97.3°F | Resp 16 | Ht 77.0 in | Wt 319.0 lb

## 2020-12-25 DIAGNOSIS — E6609 Other obesity due to excess calories: Secondary | ICD-10-CM

## 2020-12-25 DIAGNOSIS — Z6838 Body mass index (BMI) 38.0-38.9, adult: Secondary | ICD-10-CM

## 2020-12-25 DIAGNOSIS — K219 Gastro-esophageal reflux disease without esophagitis: Secondary | ICD-10-CM

## 2020-12-25 DIAGNOSIS — E782 Mixed hyperlipidemia: Secondary | ICD-10-CM

## 2020-12-25 MED ORDER — PANTOPRAZOLE SODIUM 40 MG PO TBEC
DELAYED_RELEASE_TABLET | Freq: Every day | ORAL | 0 refills | Status: DC
  Filled 2021-02-04: qty 30, 30d supply, fill #0

## 2020-12-25 NOTE — Progress Notes (Signed)
Medication RF- protonix. Off x 1 week. Has indigestion

## 2020-12-25 NOTE — Patient Instructions (Signed)
Why should you not take PPIs long-term? Long-term PPI (omeprazole, Protonix, Nexium ) use has been associated with an increased risk of several adverse outcomes in observational studies, including bone fractures, vitamin B12, magnesium or iron deficiency, Clostridium difficile infection and community-acquired pneumonia.  Discussed eating small frequent meal, reduction in acidic foods, fried foods ,spicy foods, alcohol caffeine and tobacco and certain medications. Avoid laying down after eating 63mins-1hour, elevated head of the bed.

## 2020-12-25 NOTE — Progress Notes (Signed)
   [facility]   Subjective:     Preston [name] is an 40 y.o. male who presents for evaluation of heartburn. This has been associated with heartburn. He denies chest pain, choking on food, cough, difficulty swallowing, dysphagia, early satiety, fullness after meals, laryngitis, and midespigastric pain. Symptoms have been present for 1 week. He denies dysphagia. He has not lost weight. He denies melena, hematochezia, hematemesis, and coffee ground emesis. Medical therapy in the past has included: proton pump inhibitors.  The following portions of the patient's history were reviewed and updated as appropriate: allergies, current medications, past family history, past medical history, past social history, and past surgical history.  Review of Systems Pertinent items noted in HPI and remainder of comprehensive ROS otherwise negative.   Objective:     BP 129/86   Pulse 79   Temp (!) 97.3 F (36.3 C)   Resp 16   Ht $R'6\' 5"'Fa$  (1.956 m)   Wt (!) 319 lb (144.7 kg)   SpO2 98%   BMI 37.83 kg/m     Assessment:  Preston [name] was seen today for medication refill.  Diagnoses and all orders for this visit:  Gastroesophageal reflux disease, unspecified whether esophagitis present Discussed eating small frequent meal, reduction in acidic foods, fried foods ,spicy foods, alcohol caffeine and tobacco and certain medications. Avoid laying down after eating 33mins-1hour, elevated head of the bed.  -     pantoprazole (PROTONIX) 40 MG tablet; TAKE 1 TABLET (40 MG TOTAL) BY MOUTH DAILY.  Class 2 obesity due to excess calories without serious comorbidity with body mass index (BMI) of 38.0 to 38.9 in adult Obesity is 30-39 indicating an excess in caloric intake or underlining conditions. This may lead to other co-morbidities. Lifestyle modifications of diet and exercise may reduce obesity.    Mixed hyperlipidemia  Healthy lifestyle diet of fruits vegetables fish nuts whole grains and low  saturated fat . Foods high in cholesterol or liver, fatty meats,cheese, butter avocados, nuts and seeds, chocolate and fried foods. -     Lipid Panel -     CMP14+EGFR   This note has been created with Surveyor, quantity. Any transcriptional errors are unintentional.  Kerin Perna

## 2020-12-26 LAB — CMP14+EGFR
ALT: 18 IU/L (ref 0–44)
AST: 14 IU/L (ref 0–40)
Albumin/Globulin Ratio: 1.6 (ref 1.2–2.2)
Albumin: 4.5 g/dL (ref 4.0–5.0)
Alkaline Phosphatase: 86 IU/L (ref 44–121)
BUN/Creatinine Ratio: 8 — ABNORMAL LOW (ref 9–20)
BUN: 9 mg/dL (ref 6–20)
Bilirubin Total: 0.3 mg/dL (ref 0.0–1.2)
CO2: 22 mmol/L (ref 20–29)
Calcium: 10 mg/dL (ref 8.7–10.2)
Chloride: 101 mmol/L (ref 96–106)
Creatinine, Ser: 1.17 mg/dL (ref 0.76–1.27)
Globulin, Total: 2.8 g/dL (ref 1.5–4.5)
Glucose: 103 mg/dL — ABNORMAL HIGH (ref 65–99)
Potassium: 4.6 mmol/L (ref 3.5–5.2)
Sodium: 137 mmol/L (ref 134–144)
Total Protein: 7.3 g/dL (ref 6.0–8.5)
eGFR: 81 mL/min/{1.73_m2} (ref >59)

## 2020-12-26 LAB — LIPID PANEL
Chol/HDL Ratio: 5 ratio (ref 0.0–5.0)
Cholesterol, Total: 224 mg/dL — ABNORMAL HIGH (ref 100–199)
HDL: 45 mg/dL (ref >39)
LDL Chol Calc (NIH): 159 mg/dL — ABNORMAL HIGH (ref 0–99)
Triglycerides: 110 mg/dL (ref 0–149)
VLDL Cholesterol Cal: 20 mg/dL (ref 5–40)

## 2020-12-27 ENCOUNTER — Other Ambulatory Visit (INDEPENDENT_AMBULATORY_CARE_PROVIDER_SITE_OTHER): Payer: Self-pay | Admitting: Primary Care

## 2020-12-27 DIAGNOSIS — E782 Mixed hyperlipidemia: Secondary | ICD-10-CM

## 2020-12-27 MED ORDER — ATORVASTATIN CALCIUM 40 MG PO TABS
40.0000 mg | ORAL_TABLET | Freq: Every day | ORAL | 3 refills | Status: DC
  Filled 2020-12-27: qty 30, 30d supply, fill #0

## 2020-12-28 ENCOUNTER — Other Ambulatory Visit: Payer: Self-pay

## 2021-01-04 ENCOUNTER — Other Ambulatory Visit: Payer: Self-pay

## 2021-02-04 ENCOUNTER — Other Ambulatory Visit: Payer: Self-pay

## 2021-03-02 ENCOUNTER — Encounter (HOSPITAL_COMMUNITY): Payer: Self-pay

## 2021-03-02 ENCOUNTER — Encounter (HOSPITAL_COMMUNITY): Payer: Self-pay | Admitting: Licensed Clinical Social Worker

## 2021-03-02 ENCOUNTER — Other Ambulatory Visit: Payer: Self-pay

## 2021-03-02 ENCOUNTER — Ambulatory Visit (HOSPITAL_COMMUNITY)
Admission: EM | Admit: 2021-03-02 | Discharge: 2021-03-02 | Disposition: A | Discharge status: Home / Self Care | Payer: Self-pay | Attending: Family Medicine | Admitting: Family Medicine

## 2021-03-02 ENCOUNTER — Ambulatory Visit (INDEPENDENT_AMBULATORY_CARE_PROVIDER_SITE_OTHER): Payer: No Payment, Other | Admitting: Licensed Clinical Social Worker

## 2021-03-02 DIAGNOSIS — F6381 Intermittent explosive disorder: Secondary | ICD-10-CM | POA: Diagnosis not present

## 2021-03-02 DIAGNOSIS — Z202 Contact with and (suspected) exposure to infections with a predominantly sexual mode of transmission: Secondary | ICD-10-CM | POA: Insufficient documentation

## 2021-03-02 DIAGNOSIS — F320 Major depressive disorder, single episode, mild: Secondary | ICD-10-CM | POA: Insufficient documentation

## 2021-03-02 DIAGNOSIS — F411 Generalized anxiety disorder: Secondary | ICD-10-CM | POA: Insufficient documentation

## 2021-03-02 DIAGNOSIS — Z711 Person with feared health complaint in whom no diagnosis is made: Secondary | ICD-10-CM | POA: Insufficient documentation

## 2021-03-02 MED ORDER — DOXYCYCLINE HYCLATE 100 MG PO TABS
100.0000 mg | ORAL_TABLET | Freq: Two times a day (BID) | ORAL | 0 refills | Status: DC
  Filled 2021-03-02: qty 14, 7d supply, fill #0
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
## 2021-03-02 NOTE — Discharge Instructions (Signed)
You have been given the following today for treatment of possible chlamydia:  Please pick up your prescription for doxycycline 100 mg and begin taking twice daily for the next seven (7) days.  Even though we have treated you today, we have sent testing for sexually transmitted infections. We will notify you of any positive results once they are received. If required, we will prescribe any medications you might need.  Please refrain from all sexual activity for at least the next seven days.  

## 2021-03-02 NOTE — Plan of Care (Signed)
Pt agreeable to plan  ?

## 2021-03-02 NOTE — ED Triage Notes (Signed)
Pt presents for STD's test.Reports girlfriend tested positive for chlamydia today.
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
## 2021-03-03 LAB — CYTOLOGY, (ORAL, ANAL, URETHRAL) ANCILLARY ONLY
Chlamydia: POSITIVE — AB
Comment: NEGATIVE
Comment: NEGATIVE
Comment: NORMAL
Neisseria Gonorrhea: NEGATIVE
Trichomonas: POSITIVE — AB

## 2021-03-04 ENCOUNTER — Telehealth (HOSPITAL_COMMUNITY): Payer: Self-pay | Admitting: Emergency Medicine

## 2021-03-04 MED ORDER — METRONIDAZOLE 500 MG PO TABS
2000.0000 mg | ORAL_TABLET | Freq: Once | ORAL | 0 refills | Status: AC
  Filled 2021-03-08: qty 4, 1d supply, fill #0

## 2021-03-08 ENCOUNTER — Other Ambulatory Visit (INDEPENDENT_AMBULATORY_CARE_PROVIDER_SITE_OTHER): Payer: Self-pay | Admitting: Family Medicine

## 2021-03-08 ENCOUNTER — Other Ambulatory Visit (INDEPENDENT_AMBULATORY_CARE_PROVIDER_SITE_OTHER): Payer: Self-pay | Admitting: Primary Care

## 2021-03-08 ENCOUNTER — Other Ambulatory Visit: Payer: Self-pay

## 2021-03-08 DIAGNOSIS — K219 Gastro-esophageal reflux disease without esophagitis: Secondary | ICD-10-CM

## 2021-03-09 ENCOUNTER — Other Ambulatory Visit (INDEPENDENT_AMBULATORY_CARE_PROVIDER_SITE_OTHER): Payer: Self-pay | Admitting: Family Medicine

## 2021-03-09 ENCOUNTER — Other Ambulatory Visit: Payer: Self-pay

## 2021-03-09 MED ORDER — PANTOPRAZOLE SODIUM 40 MG PO TBEC
DELAYED_RELEASE_TABLET | Freq: Every day | ORAL | 0 refills | Status: DC
  Filled 2021-03-09: qty 30, 30d supply, fill #0

## 2021-03-09 NOTE — Telephone Encounter (Signed)
Requested Prescriptions  Pending Prescriptions Disp Refills  . pantoprazole (PROTONIX) 40 MG tablet 30 tablet 0    Sig: TAKE 1 TABLET (40 MG TOTAL) BY MOUTH DAILY.     Gastroenterology: Proton Pump Inhibitors Passed - 03/08/2021  4:59 PM      Passed - Valid encounter within last 12 months    Recent Outpatient Visits          2 months ago Gastroesophageal reflux disease, unspecified whether esophagitis present   [facility] Grayce Sessions, NP   1 year ago Annual visit for general adult medical examination with abnormal findings   [facility] Grayce Sessions, NP   4 years ago Anxiety   [facility] Robinette, Jenner R, FNP   5 years ago Knee pain, acute, left   [facility] [place], [place], [place]   5 years ago Anxiety   [facility] Hoy Register, MD      Future Appointments            In 3 months Randa Evens, Kinnie Scales, NP [facility]

## 2021-03-10 ENCOUNTER — Ambulatory Visit (INDEPENDENT_AMBULATORY_CARE_PROVIDER_SITE_OTHER): Payer: Self-pay | Admitting: *Deleted

## 2021-03-10 ENCOUNTER — Other Ambulatory Visit: Payer: Self-pay

## 2021-03-10 NOTE — Telephone Encounter (Signed)
Reason for Disposition  Prescription request for new medicine (not a refill)  Answer Assessment - Initial Assessment Questions 1. DRUG NAME: "What medicine do you need to have refilled?"     Doxycycline  2. REFILLS REMAINING: "How many refills are remaining?" (Note: The label on the medicine or pill bottle will show how many refills are remaining. If there are no refills remaining, then a renewal may be needed.)     no 3. EXPIRATION DATE: "What is the expiration date?" (Note: The label states when the prescription will expire, and thus can no longer be refilled.)     Na  4. PRESCRIBING HCP: "Who prescribed it?" Reason: If prescribed by specialist, call should be referred to that group.     [facility]  MD 5. SYMPTOMS: "Do you have any symptoms?"     No  6. PREGNANCY: "Is there any chance that you are pregnant?" "When was your last menstrual period?"     na  Protocols used: Medication Refill and Renewal Call-A-AH

## 2021-03-10 NOTE — Telephone Encounter (Signed)
Patient aware that PCP. Unable to treat without an appt.

## 2021-03-10 NOTE — Telephone Encounter (Signed)
Caller requesting prescription for doxycyline that was prescribed by UC MD. Reports he was notified by partner of chlamydia dx  and went to UC for evaluation 03/02/21 and prescribed doxycyline x 7 days . Denies symptoms and only went to UC for evaluation per partners dx. Patient was positive for chlamydia. Patient traveled and left last dose of medication  in a room and was unable to finish today's dose. Patient requesting 2 tabs to complete his prescribed 7 day course. Denies symptoms. Please advise if patient needs appt or can he get last dose without appt. Instructed patient to go to UC and see if MD could prescribe medication , allow PCP time to respond to his request or  go online for E visit or go to ED if symptoms arise . Care advise given. Patient verbalized understanding of care advise and to call back or go to [facility] for medication. Patient requesting a call back .

## 2021-03-19 ENCOUNTER — Other Ambulatory Visit: Payer: Self-pay

## 2021-03-26 ENCOUNTER — Other Ambulatory Visit: Payer: Self-pay

## 2021-03-26 ENCOUNTER — Ambulatory Visit (HOSPITAL_COMMUNITY)
Admission: EM | Admit: 2021-03-26 | Discharge: 2021-03-26 | Disposition: A | Discharge status: Home / Self Care | Payer: Medicaid Other | Attending: Emergency Medicine | Admitting: Emergency Medicine

## 2021-03-26 DIAGNOSIS — A749 Chlamydial infection, unspecified: Secondary | ICD-10-CM | POA: Insufficient documentation

## 2021-03-26 LAB — HIV ANTIBODY (ROUTINE TESTING W REFLEX): HIV Screen 4th Generation wRfx: NONREACTIVE

## 2021-03-26 MED ORDER — DOXYCYCLINE HYCLATE 100 MG PO TABS
100.0000 mg | ORAL_TABLET | Freq: Two times a day (BID) | ORAL | 0 refills | Status: DC
  Filled 2021-03-26: qty 14, 7d supply, fill #0

## 2021-03-26 NOTE — ED Triage Notes (Addendum)
[place] medicines [place] another state and needs refill. Meds for std

## 2021-03-26 NOTE — Discharge Instructions (Addendum)
Take the metronidazole and doxycycline as prescribed.   You will get a call if tests are positive, you will not get a call if tests are negative but you can check results in MyChart if you have a MyChart account.

## 2021-03-26 NOTE — ED Provider Notes (Addendum)
[facility]    CSN: [id number] Arrival date & time: 03/26/21  1311      History   Chief Complaint Chief Complaint  Patient presents with   Medication Refill    HPI Preston [name] is a 40 y.o. male.  He was seen here in November and given doxycycline for chlamydia and metronidazole for trichomonas.  He took a couple of days of doxycycline but [place] and did not finish it.  He is requesting a refill of the doxycycline.  He has the metronidazole but has not taken it yet.  Is having penile discharge.  Denies any other symptoms.   Medication Refill  Past Medical History:  Diagnosis Date   Acid reflux    Anxiety    Depression    Gastroparesis     Patient Active Problem List   Diagnosis Date Noted   Intermittent explosive disorder in adult 03/02/2021   Mild major depression, single episode (H[facility]) 03/02/2021   GAD (generalized anxiety disorder) 03/02/2021   Obesity 09/30/2015   Pain in the chest 11/17/2014   Anxiety 11/17/2014   GERD (gastroesophageal reflux disease) 11/17/2014   Adjustment disorder with mixed anxiety and depressed mood 10/07/2014    Past Surgical History:  Procedure Laterality Date   ESOPHAGOGASTRODUODENOSCOPY     no past surgeries         Home Medications    Prior to Admission medications   Medication Sig Start Date End Date Taking? Authorizing Provider  atorvastatin (LIPITOR) 40 MG tablet Take 1 tablet (40 mg total) by mouth daily. 12/27/20   Grayce Sessions, NP  busPIRone (BUSPAR) 10 MG tablet TAKE 1 TABLET(10 MG) BY MOUTH THREE TIMES DAILY Patient not taking: No sig reported 11/02/16   Lizbeth Bark, FNP  doxycycline (VIBRA-TABS) 100 MG tablet Take 1 tablet (100 mg total) by mouth 2 (two) times daily. 03/26/21   Cathlyn Parsons, NP  pantoprazole (PROTONIX) 40 MG tablet TAKE 1 TABLET (40 MG TOTAL) BY MOUTH DAILY. 03/09/21 03/09/22  Grayce Sessions, NP    Family History Family  History  Problem Relation Age of Onset   Cancer Father    Diabetes Father    Heart attack Other     Social History Social History   Tobacco Use   Smoking status: Former    Packs/day: 0.00    Types: Cigars, Cigarettes    Quit date: 09/06/2014    Years since quitting: 6.5   Smokeless tobacco: Never   Tobacco comments:    2 black and milds daily.   Vaping Use   Vaping Use: Never used  Substance Use Topics   Alcohol use: Yes    Alcohol/week: 1.0 - 2.0 standard drink    Types: 1 - 2 Cans of beer per week    Comment: occasional use   Drug use: Yes    Frequency: 3.5 times per week    Types: Marijuana    Comment: 1 x weekly.     Allergies   Other, Red dye, and Yellow dyes (non-tartrazine)   Review of Systems Review of Systems  Constitutional:  Negative for chills and fever.  Gastrointestinal:  Negative for abdominal pain, nausea and vomiting.  Genitourinary:  Positive for penile discharge. Negative for dysuria and genital sores.    Physical Exam Triage Vital Signs ED Triage Vitals  Enc Vitals Group     BP 03/26/21 1434 135/88     Pulse Rate 03/26/21 1434 76  Resp 03/26/21 1434 18     Temp 03/26/21 1434 98.1 F (36.7 C)     Temp src --      SpO2 03/26/21 1434 97 %     Weight --      Height --      Head Circumference --      Peak Flow --      Pain Score 03/26/21 1432 0     Pain Loc --      Pain Edu? --      Excl. in GC? --    No data found.  Updated Vital Signs BP 135/88   Pulse 76   Temp 98.1 F (36.7 C)   Resp 18   SpO2 97%   Visual Acuity Right Eye Distance:   Left Eye Distance:   Bilateral Distance:    Right Eye Near:   Left Eye Near:    Bilateral Near:     Physical Exam Constitutional:      General: He is not in acute distress.    Appearance: Normal appearance. He is not ill-appearing.  Pulmonary:     Effort: Pulmonary effort is normal.  Neurological:     Mental Status: He is alert and oriented to person, place, and time.      Gait: Gait normal.     UC Treatments / Results  Labs (all labs ordered are listed, but only abnormal results are displayed) Labs Reviewed  HIV ANTIBODY (ROUTINE TESTING W REFLEX)  RPR    EKG   Radiology No results found.  Procedures Procedures (including critical care time)  Medications Ordered in UC Medications - No data to display  Initial Impression / Assessment and Plan / UC Course  I have reviewed the triage vital signs and the nursing notes.  Pertinent labs & imaging results that were available during my care of the patient were reviewed by me and considered in my medical decision making (see chart for details).    We discussed that if you have 1 STI it is possible you could have others.  Patient agrees to RPR and HIV testing.  Final Clinical Impressions(s) / UC Diagnoses   Final diagnoses:  Chlamydia     Discharge Instructions      Take the metronidazole and doxycycline as prescribed.   You will get a call if tests are positive, you will not get a call if tests are negative but you can check results in MyChart if you have a MyChart account.       ED Prescriptions     Medication Sig Dispense Auth. Provider   doxycycline (VIBRA-TABS) 100 MG tablet Take 1 tablet (100 mg total) by mouth 2 (two) times daily. 14 tablet Cathlyn Parsons, NP      PDMP not reviewed this encounter.   Cathlyn Parsons, NP 03/26/21 1515    Cathlyn Parsons, NP 03/26/21 [phone]

## 2021-03-27 LAB — RPR: RPR Ser Ql: NONREACTIVE

## 2021-04-13 ENCOUNTER — Other Ambulatory Visit (INDEPENDENT_AMBULATORY_CARE_PROVIDER_SITE_OTHER): Payer: Self-pay | Admitting: Primary Care

## 2021-04-13 ENCOUNTER — Other Ambulatory Visit: Payer: Self-pay

## 2021-04-13 DIAGNOSIS — K219 Gastro-esophageal reflux disease without esophagitis: Secondary | ICD-10-CM

## 2021-04-13 MED ORDER — PANTOPRAZOLE SODIUM 40 MG PO TBEC
DELAYED_RELEASE_TABLET | Freq: Every day | ORAL | 0 refills | Status: DC
  Filled 2021-04-13 – 2021-04-19 (×2): qty 30, 30d supply, fill #0

## 2021-04-15 ENCOUNTER — Other Ambulatory Visit: Payer: Self-pay

## 2021-04-15 ENCOUNTER — Ambulatory Visit (HOSPITAL_COMMUNITY)
Admission: EM | Admit: 2021-04-15 | Discharge: 2021-04-15 | Disposition: A | Discharge status: Home / Self Care | Payer: Medicaid Other | Attending: Family Medicine | Admitting: Family Medicine

## 2021-04-15 DIAGNOSIS — R369 Urethral discharge, unspecified: Secondary | ICD-10-CM

## 2021-04-15 DIAGNOSIS — Z113 Encounter for screening for infections with a predominantly sexual mode of transmission: Secondary | ICD-10-CM

## 2021-04-15 LAB — HIV ANTIBODY (ROUTINE TESTING W REFLEX): HIV Screen 4th Generation wRfx: NONREACTIVE

## 2021-04-15 MED ORDER — METRONIDAZOLE 500 MG PO TABS
2000.0000 mg | ORAL_TABLET | Freq: Once | ORAL | 0 refills | Status: AC
  Filled 2021-04-15: qty 4, 1d supply, fill #0

## 2021-04-15 MED ORDER — DOXYCYCLINE HYCLATE 100 MG PO TABS
100.0000 mg | ORAL_TABLET | Freq: Two times a day (BID) | ORAL | 0 refills | Status: AC
  Filled 2021-04-15: qty 14, 7d supply, fill #0

## 2021-04-15 NOTE — ED Provider Notes (Signed)
[facility]    CSN: [id number] Arrival date & time: 04/15/21  1457      History   Chief Complaint No chief complaint on file.   HPI Preston [name] is a 40 y.o. male.   HPI Here for STI check and for penile dc. He tested positive for chlamydia and trich in mid November. He returned 12/9 to get a new rx of his doxycycline, as had forgotten it when he was out of town. Symptoms improved, and now dc is back. Turns out his partner did not get treated when she has said she did, when he was treated initially. (She did go today for testing/treatment). Pt would also like to do HIV and RPR screening.  Past Medical History:  Diagnosis Date   Acid reflux    Anxiety    Depression    Gastroparesis     Patient Active Problem List   Diagnosis Date Noted   Intermittent explosive disorder in adult 03/02/2021   Mild major depression, single episode (HCC) 03/02/2021   GAD (generalized anxiety disorder) 03/02/2021   Obesity 09/30/2015   Pain in the chest 11/17/2014   Anxiety 11/17/2014   GERD (gastroesophageal reflux disease) 11/17/2014   Adjustment disorder with mixed anxiety and depressed mood 10/07/2014    Past Surgical History:  Procedure Laterality Date   ESOPHAGOGASTRODUODENOSCOPY     no past surgeries         Home Medications    Prior to Admission medications   Medication Sig Start Date End Date Taking? Authorizing Provider  doxycycline (VIBRAMYCIN) 100 MG capsule Take 1 capsule (100 mg total) by mouth 2 (two) times daily for 7 days. 04/15/21 04/22/21 Yes Kip Kautzman, Janace Aris, MD  metroNIDAZOLE (FLAGYL) 500 MG tablet Take 4 tablets (2,000 mg total) by mouth once for 1 dose. 04/15/21 04/15/21 Yes Javaughn Opdahl, Janace Aris, MD  pantoprazole (PROTONIX) 40 MG tablet TAKE 1 TABLET (40 MG TOTAL) BY MOUTH DAILY. 04/13/21 04/13/22 Yes Grayce Sessions, NP  atorvastatin (LIPITOR) 40 MG tablet Take 1 tablet (40 mg total) by mouth daily. 12/27/20   Grayce Sessions, NP   busPIRone (BUSPAR) 10 MG tablet TAKE 1 TABLET(10 MG) BY MOUTH THREE TIMES DAILY Patient not taking: No sig reported 11/02/16   Lizbeth Bark, FNP    Family History Family History  Problem Relation Age of Onset   Cancer Father    Diabetes Father    Heart attack Other     Social History Social History   Tobacco Use   Smoking status: Former    Packs/day: 0.00    Types: Cigars, Cigarettes    Quit date: 09/06/2014    Years since quitting: 6.6   Smokeless tobacco: Never   Tobacco comments:    2 black and milds daily.   Vaping Use   Vaping Use: Never used  Substance Use Topics   Alcohol use: Yes    Alcohol/week: 1.0 - 2.0 standard drink    Types: 1 - 2 Cans of beer per week    Comment: occasional use   Drug use: Yes    Frequency: 3.5 times per week    Types: Marijuana    Comment: 1 x weekly.     Allergies   Other, Red dye, and Yellow dyes (non-tartrazine)   Review of Systems Review of Systems   Physical Exam Triage Vital Signs ED Triage Vitals  Enc Vitals Group     BP 04/15/21 1637 125/89     Pulse Rate 04/15/21  1637 81     Resp 04/15/21 1637 16     Temp 04/15/21 1637 98.1 F (36.7 C)     Temp Source 04/15/21 1637 Oral     SpO2 04/15/21 1637 100 %     Weight --      Height --      Head Circumference --      Peak Flow --      Pain Score 04/15/21 1638 0     Pain Loc --      Pain Edu? --      Excl. in GC? --    No data found.  Updated Vital Signs BP 125/89 (BP Location: Right Arm)    Pulse 81    Temp 98.1 F (36.7 C) (Oral)    Resp 16    SpO2 100%   Visual Acuity Right Eye Distance:   Left Eye Distance:   Bilateral Distance:    Right Eye Near:   Left Eye Near:    Bilateral Near:     Physical Exam Vitals reviewed.  Constitutional:      General: He is not in acute distress.    Appearance: He is not toxic-appearing.  Cardiovascular:     Rate and Rhythm: Normal rate and regular rhythm.  Pulmonary:     Breath sounds: Normal breath  sounds.  Skin:    Coloration: Skin is not jaundiced or pale.  Neurological:     Mental Status: He is alert and oriented to person, place, and time.  Psychiatric:        Behavior: Behavior normal.     UC Treatments / Results  Labs (all labs ordered are listed, but only abnormal results are displayed) Labs Reviewed  RPR  HIV ANTIBODY (ROUTINE TESTING W REFLEX)  CYTOLOGY, (ORAL, ANAL, URETHRAL) ANCILLARY ONLY    EKG   Radiology No results found.  Procedures Procedures (including critical care time)  Medications Ordered in UC Medications - No data to display  Initial Impression / Assessment and Plan / UC Course  I have reviewed the triage vital signs and the nursing notes.  Pertinent labs & imaging results that were available during my care of the patient were reviewed by me and considered in my medical decision making (see chart for details).     Will go ahead and retreat for chlamydia and trich, since we know his partner has not been treated yet. Final Clinical Impressions(s) / UC Diagnoses   Final diagnoses:  Penile discharge  Routine screening for STI (sexually transmitted infection)     Discharge Instructions      Take doxycycline 100 mg 1 tab twice daily for 7 days. Take the metronidazole 500 mg 4 tabs together after a good meal one time. Do not drink alcohol for 72 hours after taking it. Rest from sexual intercourse for 2 weeks.  Staff will call you if anything is positive that needs other treatment     ED Prescriptions     Medication Sig Dispense Auth. Provider   doxycycline (VIBRAMYCIN) 100 MG capsule Take 1 capsule (100 mg total) by mouth 2 (two) times daily for 7 days. 14 capsule Neelie Welshans, Janace Aris, MD   metroNIDAZOLE (FLAGYL) 500 MG tablet Take 4 tablets (2,000 mg total) by mouth once for 1 dose. 4 tablet Bryan Goin, Janace Aris, MD      PDMP not reviewed this encounter.   Zenia Resides, MD 04/15/21 [id number]

## 2021-04-15 NOTE — Discharge Instructions (Signed)
Take doxycycline 100 mg 1 tab twice daily for 7 days. Take the metronidazole 500 mg 4 tabs together after a good meal one time. Do not drink alcohol for 72 hours after taking it. Rest from sexual intercourse for 2 weeks.  Staff will call you if anything is positive that needs other treatment

## 2021-04-15 NOTE — ED Triage Notes (Signed)
Pt here for STI testing with blood work. He does not report any symptoms at this time.

## 2021-04-16 ENCOUNTER — Other Ambulatory Visit: Payer: Self-pay

## 2021-04-16 LAB — CYTOLOGY, (ORAL, ANAL, URETHRAL) ANCILLARY ONLY
Chlamydia: NEGATIVE
Comment: NEGATIVE
Comment: NEGATIVE
Comment: NORMAL
Neisseria Gonorrhea: NEGATIVE
Trichomonas: NEGATIVE

## 2021-04-16 LAB — RPR: RPR Ser Ql: NONREACTIVE

## 2021-04-19 ENCOUNTER — Other Ambulatory Visit: Payer: Self-pay

## 2021-04-27 ENCOUNTER — Ambulatory Visit (INDEPENDENT_AMBULATORY_CARE_PROVIDER_SITE_OTHER): Payer: No Payment, Other | Admitting: Licensed Clinical Social Worker

## 2021-04-27 ENCOUNTER — Other Ambulatory Visit: Payer: Self-pay

## 2021-04-27 DIAGNOSIS — F320 Major depressive disorder, single episode, mild: Secondary | ICD-10-CM | POA: Diagnosis not present

## 2021-04-27 DIAGNOSIS — F411 Generalized anxiety disorder: Secondary | ICD-10-CM
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
## 2021-05-20 ENCOUNTER — Other Ambulatory Visit (INDEPENDENT_AMBULATORY_CARE_PROVIDER_SITE_OTHER): Payer: Self-pay | Admitting: Primary Care

## 2021-05-20 DIAGNOSIS — K219 Gastro-esophageal reflux disease without esophagitis: Secondary | ICD-10-CM

## 2021-05-20 MED ORDER — PANTOPRAZOLE SODIUM 40 MG PO TBEC
DELAYED_RELEASE_TABLET | Freq: Every day | ORAL | 1 refills | Status: DC
  Filled 2021-05-20: qty 90, fill #0
  Filled 2021-05-24: qty 30, 30d supply, fill #0
  Filled 2021-06-25: qty 30, 30d supply, fill #1
  Filled 2021-07-28: qty 30, 30d supply, fill #2
  Filled 2021-09-06: qty 30, 30d supply, fill #3
  Filled 2021-10-08: qty 30, 30d supply, fill #4
  Filled 2021-11-10: qty 30, 30d supply, fill #5

## 2021-05-20 NOTE — Telephone Encounter (Signed)
Requested Prescriptions  Pending Prescriptions Disp Refills   pantoprazole (PROTONIX) 40 MG tablet 90 tablet 1    Sig: TAKE 1 TABLET (40 MG TOTAL) BY MOUTH DAILY.     Gastroenterology: Proton Pump Inhibitors Passed - 05/20/2021  3:07 PM      Passed - Valid encounter within last 12 months    Recent Outpatient Visits          4 months ago Gastroesophageal reflux disease, unspecified whether esophagitis present   [facility] Grayce Sessions, NP   1 year ago Annual visit for general adult medical examination with abnormal findings   [facility] Grayce Sessions, NP   4 years ago Anxiety   [facility] [place], [place], FNP   5 years ago Knee pain, acute, left   [facility] [place], [place], [place]   5 years ago Anxiety   [facility] Hoy Register, MD      Future Appointments            In 1 month Randa Evens, Kinnie Scales, NP [facility]

## 2021-05-24 ENCOUNTER — Other Ambulatory Visit: Payer: Self-pay

## 2021-05-25 ENCOUNTER — Other Ambulatory Visit: Payer: Self-pay

## 2021-06-01 ENCOUNTER — Ambulatory Visit (HOSPITAL_COMMUNITY): Payer: No Payment, Other | Admitting: Licensed Clinical Social Worker

## 2021-06-22 ENCOUNTER — Other Ambulatory Visit: Payer: Self-pay

## 2021-06-22 ENCOUNTER — Ambulatory Visit (INDEPENDENT_AMBULATORY_CARE_PROVIDER_SITE_OTHER): Payer: No Payment, Other | Admitting: Licensed Clinical Social Worker

## 2021-06-22 ENCOUNTER — Encounter (HOSPITAL_COMMUNITY): Payer: Self-pay | Admitting: Licensed Clinical Social Worker

## 2021-06-22 DIAGNOSIS — F320 Major depressive disorder, single episode, mild: Secondary | ICD-10-CM

## 2021-06-22 DIAGNOSIS — F411 Generalized anxiety disorder: Secondary | ICD-10-CM

## 2021-06-22 NOTE — Progress Notes (Signed)
? ?  THERAPIST PROGRESS NOTE ? ?Session Time: 40 ? ?Participation Level: Active ? ?Behavioral Response: CasualAlertAnxious and Depressed ? ?Type of Therapy: Individual Therapy ? ?Treatment Goals addressed: Decrease PHQ-9 below 5 & Preston [name] will identify situations, thoughts, and feelings that trigger internal anger, angry verbal and/or aggressive behavioral actions as evidenced by a self-recorded  ?report. ? ?ProgressTowards Goals: Met ? ?Interventions: CBT, Motivational Interviewing, and Supportive ? ?Summary: Preston [name] is a 41 y.o. male who presents with euthymic mood\affect.  Patient was pleasant, cooperative, maintained good eye contact.  He engaged well in therapy session was dressed casually. patient was alert and oriented x5. ? ? Patient reports today primary stressors as family conflict.  Patient reports that he has 4 total children with 3 different women.  Patient states that he has a good relationship with his youngest daughter's mother.  He reports that he currently lives with her.  Patient reports that the other 2 mothers he does not have a good relationship with.  The oldest daughter's mother does not let him have any contact with his daughter.  This is because patient stress and tension.  Patient reports that his middle children who are twins he has contact with on a regular basis however the mother of the twins patient has a up-and-down relationship with.  Preston [name] reports good relationship with his significant other who he has no children with and they are working through communication issues.  Preston [name] reports good news as he is starting truck driving school in 2 weeks.  Patient reports that this will be for classes 8 hours/day.  Patient reports that he would like to have a full-time job by June 1. ? ?Suicidal/Homicidal: Nowithout intent/plan ? ?Therapist Response:  ? ? Interventions: LCSW administered PHQ-9.  For patient for the second session around scored below 5.  LCSW will mark completion of  goal\objective to decrease PHQ-9 below a 5.  LCSW also identified triggers to identify situations, thoughts, feelings that trigger internal anger, and angry verbal and aggressive behavioral actions.  Patient reports irritability and anger by mothers of his children.  Patient to continue with plan listed below.  LCSW utilized supportive therapy for praise and encouragement. ? ?Plan: For patient to follow-up with LCSW for next available.  LCSW explained to patient that he no showed in his last appointment.  LCSW explained the importance of showing up to therapy on a regular basis.  LCSW explained that because another appointment was not made next available appointment is not until April 25 at 3 PM.  Patient was agreeable to be seen on April 25.  Patient to continue work on decreasing his GAD-7 below a 5.  Goals completed today were decreasing PHQ-9 below a 5 and identifies situations, thoughts, and feelings that trigger internal anger and angry aggressive behavioral actions. ? ?Diagnosis: No diagnosis found. ? ?Collaboration of Care: Other none in today's sessions  ? ?Patient/Guardian was advised Release of Information must be obtained prior to any record release in order to collaborate their care with an outside provider. Patient/Guardian was advised if they have not already done so to contact the registration department to sign all necessary forms in order for [place] to release information regarding their care.  ? ?Consent: Patient/Guardian gives verbal consent for treatment and assignment of benefits for services provided during this visit. Patient/Guardian expressed understanding and agreed to proceed.  ? ?Dory Horn, LCSW ?06/22/2021 ? ?

## 2021-06-22 NOTE — Plan of Care (Signed)
Pt completed goal of getting PHQ-9 below 10. Pt reports no suicidal and homicidal thoughts and feeling in the past 2 months.  ?

## 2021-06-24 ENCOUNTER — Ambulatory Visit (INDEPENDENT_AMBULATORY_CARE_PROVIDER_SITE_OTHER): Payer: Self-pay | Admitting: Primary Care

## 2021-06-24 ENCOUNTER — Other Ambulatory Visit (HOSPITAL_COMMUNITY)
Admission: RE | Admit: 2021-06-24 | Discharge: 2021-06-24 | Disposition: A | Discharge status: Home / Self Care | Payer: Medicaid Other | Source: Ambulatory Visit | Attending: Primary Care | Admitting: Primary Care

## 2021-06-24 ENCOUNTER — Other Ambulatory Visit: Payer: Self-pay

## 2021-06-24 ENCOUNTER — Encounter (INDEPENDENT_AMBULATORY_CARE_PROVIDER_SITE_OTHER): Payer: Self-pay | Admitting: Primary Care

## 2021-06-24 VITALS — BP 128/83 | HR 84 | Temp 97.7°F | Ht 77.0 in | Wt 320.0 lb

## 2021-06-24 DIAGNOSIS — A64 Unspecified sexually transmitted disease: Secondary | ICD-10-CM

## 2021-06-24 DIAGNOSIS — K219 Gastro-esophageal reflux disease without esophagitis: Secondary | ICD-10-CM

## 2021-06-24 NOTE — Progress Notes (Signed)
? ?   [facility] ? ? ?Subjective:  ?  ? Preston [name] is an 41 y.o. male who presents for evaluation of heartburn. This has been associated with abdominal bloating and heartburn. He denies chest pain, choking on food, cough, and hematemesis. Symptoms have been present for a period of time. He denies dysphagia. He has not lost weight. He denies melena, hematochezia, hematemesis, and coffee ground emesis. Medical therapy in the past has included: proton pump inhibitors. This was his need for appointment then informs PCP he got a text from a girl she had trichomoniasis and chlamydia could he be tested.    ? ?The following portions of the patient's history were reviewed and updated as appropriate: allergies, current medications, past family history, past medical history, past social history, and past surgical history. ? ?Review of Systems ?Pertinent items noted in HPI and remainder of comprehensive ROS otherwise negative.  ? ?Objective:  ? ?  BP 128/83 (BP Location: Right Arm, Patient Position: Sitting, Cuff Size: Large)   Pulse 84   Temp 97.7 ?F (36.5 ?C) (Oral)   Ht 6\' 5"  (1.956 m)   Wt (!) 320 lb (145.2 kg)   SpO2 98%   BMI 37.95 kg/m?  ?General appearance: alert and cooperative ?Head: Normocephalic, without obvious abnormality, atraumatic ?Eyes: conjunctivae/corneas clear. PERRL, EOM's intact. Fundi benign. ?Nose: Nares normal. Septum midline. Mucosa normal. No drainage or sinus tenderness. ?Neck: no adenopathy, no carotid bruit, no JVD, supple, symmetrical, trachea midline, and thyroid not enlarged, symmetric, no tenderness/mass/nodules ?Back: symmetric, no curvature. ROM normal. No CVA tenderness. ?Lungs: clear to auscultation bilaterally ?Heart: regular rate and rhythm, S1, S2 normal, no murmur, click, rub or gallop ?Abdomen: soft, non-tender; bowel sounds normal; no masses,  no organomegaly ?Extremities: extremities normal, atraumatic, no cyanosis or edema ?Pulses: 2+ and symmetric ?Skin:  Skin color, texture, turgor normal. No rashes or lesions ?Lymph nodes: Cervical, supraclavicular, and axillary nodes normal.  ? ?Assessment:  ? ?Ygnacio was seen today for blood pressure check and obesity. ? ?Diagnoses and all orders for this visit: ? ?Gastroesophageal reflux disease, unspecified whether esophagitis present ?Discussed eating small frequent meal, reduction in acidic foods, fried foods ,spicy foods, alcohol caffeine and tobacco and certain medications. Avoid laying down after eating 21mins-1hour, elevated head of the bed.  ? ?STD (male) ?-     Cytology (oral, anal, urethral) ancillary only ? ?This note has been created with 31m. Any transcriptional errors are unintentional.  ? ?Education officer, environmental, NP ?06/27/2021, 7:44 PM  ?

## 2021-06-25 ENCOUNTER — Other Ambulatory Visit: Payer: Self-pay

## 2021-06-28 LAB — CYTOLOGY, (ORAL, ANAL, URETHRAL) ANCILLARY ONLY
Chlamydia: NEGATIVE
Comment: NEGATIVE
Comment: NEGATIVE
Comment: NORMAL
Neisseria Gonorrhea: NEGATIVE
Trichomonas: POSITIVE — AB

## 2021-06-29 ENCOUNTER — Other Ambulatory Visit (INDEPENDENT_AMBULATORY_CARE_PROVIDER_SITE_OTHER): Payer: Self-pay | Admitting: Primary Care

## 2021-06-29 ENCOUNTER — Other Ambulatory Visit: Payer: Self-pay

## 2021-06-29 DIAGNOSIS — A599 Trichomoniasis, unspecified: Secondary | ICD-10-CM

## 2021-06-29 MED ORDER — METRONIDAZOLE 500 MG PO TABS
500.0000 mg | ORAL_TABLET | Freq: Two times a day (BID) | ORAL | 0 refills | Status: DC
Start: 2021-06-29 — End: 2022-03-03
  Filled 2021-06-29: qty 14, 7d supply, fill #0

## 2021-07-28 ENCOUNTER — Other Ambulatory Visit: Payer: Self-pay

## 2021-08-10 ENCOUNTER — Ambulatory Visit (HOSPITAL_COMMUNITY): Payer: No Payment, Other | Admitting: Licensed Clinical Social Worker

## 2021-08-10 ENCOUNTER — Encounter (HOSPITAL_COMMUNITY): Payer: Self-pay

## 2021-08-24 ENCOUNTER — Ambulatory Visit (HOSPITAL_COMMUNITY)
Admission: EM | Admit: 2021-08-24 | Discharge: 2021-08-24 | Disposition: A | Discharge status: Home / Self Care | Payer: Medicaid Other | Attending: Emergency Medicine | Admitting: Emergency Medicine

## 2021-08-24 ENCOUNTER — Encounter (HOSPITAL_COMMUNITY): Payer: Self-pay

## 2021-08-24 DIAGNOSIS — Z113 Encounter for screening for infections with a predominantly sexual mode of transmission: Secondary | ICD-10-CM | POA: Insufficient documentation

## 2021-08-24 DIAGNOSIS — Z202 Contact with and (suspected) exposure to infections with a predominantly sexual mode of transmission: Secondary | ICD-10-CM

## 2021-08-24 LAB — HIV ANTIBODY (ROUTINE TESTING W REFLEX): HIV Screen 4th Generation wRfx: NONREACTIVE

## 2021-08-24 NOTE — ED Provider Notes (Signed)
?[facility] ? ? ? ?CSN: [id number] ?Arrival date & time: 08/24/21  1243 ? ? ?  ? ?History   ?Chief Complaint ?Chief Complaint  ?Patient presents with  ? STD Testing   ? ? ?HPI ?Preston [name] is a 41 y.o. male.  ? ?Patient presents since requesting STI testing.  Denies all symptoms.  Sexually active, 1 partner, no condom use.  No known exposure. ? ?Past Medical History:  ?Diagnosis Date  ? Acid reflux   ? Anxiety   ? Depression   ? Gastroparesis   ? ? ?Patient Active Problem List  ? Diagnosis Date Noted  ? Intermittent explosive disorder in adult 03/02/2021  ? Mild major depression, single episode (HCC) 03/02/2021  ? GAD (generalized anxiety disorder) 03/02/2021  ? Obesity 09/30/2015  ? Pain in the chest 11/17/2014  ? Anxiety 11/17/2014  ? GERD (gastroesophageal reflux disease) 11/17/2014  ? Adjustment disorder with mixed anxiety and depressed mood 10/07/2014  ? ? ?Past Surgical History:  ?Procedure Laterality Date  ? ESOPHAGOGASTRODUODENOSCOPY    ? no past surgeries    ? ? ? ? ? ?Home Medications   ? ?Prior to Admission medications   ?Medication Sig Start Date End Date Taking? Authorizing Provider  ?metroNIDAZOLE (FLAGYL) 500 MG tablet Take 1 tablet (500 mg total) by mouth 2 (two) times daily. 06/29/21   Grayce Sessions, NP  ?pantoprazole (PROTONIX) 40 MG tablet TAKE 1 TABLET (40 MG TOTAL) BY MOUTH DAILY. 05/20/21 05/20/22  Grayce Sessions, NP  ? ? ?Family History ?Family History  ?Problem Relation Age of Onset  ? Cancer Father   ? Diabetes Father   ? Heart attack Other   ? ? ?Social History ?Social History  ? ?Tobacco Use  ? Smoking status: Some Days  ?  Packs/day: 0.00  ?  Types: Cigars, Cigarettes  ? Smokeless tobacco: Never  ? Tobacco comments:  ?  2 black and milds daily.   ?Vaping Use  ? Vaping Use: Never used  ?Substance Use Topics  ? Alcohol use: Not Currently  ?  Alcohol/week: 3.0 standard drinks  ?  Types: 3 Standard drinks or equivalent per week  ?  Comment: occasional use  ? Drug use: Not  Currently  ?  Types: Marijuana  ? ? ? ?Allergies   ?Other, Red dye, and Yellow dyes (non-tartrazine) ? ? ?Review of Systems ?Review of Systems  ?Constitutional: Negative.   ?Genitourinary: Negative.   ?Skin: Negative.   ? ? ?Physical Exam ?Triage Vital Signs ?ED Triage Vitals  ?Enc Vitals Group  ?   BP 08/24/21 1332 122/80  ?   Pulse Rate 08/24/21 1332 94  ?   Resp 08/24/21 1332 18  ?   Temp 08/24/21 1332 98 ?F (36.7 ?C)  ?   Temp Source 08/24/21 1332 Oral  ?   SpO2 08/24/21 1332 96 %  ?   Weight --   ?   Height --   ?   Head Circumference --   ?   Peak Flow --   ?   Pain Score 08/24/21 1333 0  ?   Pain Loc --   ?   Pain Edu? --   ?   Excl. in GC? --   ? ?No data found. ? ?Updated Vital Signs ?BP 122/80 (BP Location: Right Arm)   Pulse 94   Temp 98 ?F (36.7 ?C) (Oral)   Resp 18   SpO2 96%  ? ?Visual Acuity ?Right Eye Distance:   ?  Left Eye Distance:   ?Bilateral Distance:   ? ?Right Eye Near:   ?Left Eye Near:    ?Bilateral Near:    ? ?Physical Exam ?Constitutional:   ?   Appearance: Normal appearance.  ?HENT:  ?   Head: Normocephalic.  ?Pulmonary:  ?   Effort: Pulmonary effort is normal.  ?Genitourinary: ?   Comments: Deferred, self collect  ?Neurological:  ?   Mental Status: He is alert and oriented to person, place, and time. Mental status is at baseline.  ?Psychiatric:     ?   Mood and Affect: Mood normal.     ?   Behavior: Behavior normal.  ? ? ? ?UC Treatments / Results  ?Labs ?(all labs ordered are listed, but only abnormal results are displayed) ?Labs Reviewed  ?HIV ANTIBODY (ROUTINE TESTING W REFLEX)  ?RPR  ?CYTOLOGY, (ORAL, ANAL, URETHRAL) ANCILLARY ONLY  ? ? ?EKG ? ? ?Radiology ?No results found. ? ?Procedures ?Procedures (including critical care time) ? ?Medications Ordered in UC ?Medications - No data to display ? ?Initial Impression / Assessment and Plan / UC Course  ?I have reviewed the triage vital signs and the nursing notes. ? ?Pertinent labs & imaging results that were available during my care  of the patient were reviewed by me and considered in my medical decision making (see chart for details). ? ?Routine screening for STI ? ?STI labs pending, advised abstinence until lab results and/or treatment is complete, advised condom use during all sexual encounters moving forward, may follow-up with urgent care as needed ?Final Clinical Impressions(s) / UC Diagnoses  ? ?Final diagnoses:  ?Routine screening for STI (sexually transmitted infection)  ? ? ? ?Discharge Instructions   ? ?  ?Labs pending 2-3 days, you will be contacted if positive for any sti and treatment will be sent to the pharmacy, you will have to return to the clinic if positive for gonorrhea to receive treatment  ? ?Please refrain from having sex until labs results, if positive please refrain from having sex until treatment complete and symptoms resolve  ? ?If positive for  Chlamydia  gonorrhea or trichomoniasis please notify partner or partners so they may tested as well ? ?Moving forward, it is recommended you use some form of protection against the transmission of sti infections  such as condoms or dental dams with each sexual encounter  ? ? ? ?ED Prescriptions   ?None ?  ? ?PDMP not reviewed this encounter. ?  ?Valinda Hoar, NP ?08/24/21 1343 ? ?

## 2021-08-24 NOTE — ED Triage Notes (Signed)
Pt presents for STD Testing with no known symptoms. 

## 2021-08-24 NOTE — Discharge Instructions (Signed)
Labs pending 2-3 days, you will be contacted if positive for any sti and treatment will be sent to the pharmacy, you will have to return to the clinic if positive for gonorrhea to receive treatment   Please refrain from having sex until labs results, if positive please refrain from having sex until treatment complete and symptoms resolve   If positive for  Chlamydia  gonorrhea or trichomoniasis please notify partner or partners so they may tested as well  Moving forward, it is recommended you use some form of protection against the transmission of sti infections  such as condoms or dental dams with each sexual encounter    

## 2021-08-25 LAB — CYTOLOGY, (ORAL, ANAL, URETHRAL) ANCILLARY ONLY
Chlamydia: NEGATIVE
Comment: NEGATIVE
Comment: NEGATIVE
Comment: NORMAL
Neisseria Gonorrhea: NEGATIVE
Trichomonas: NEGATIVE

## 2021-08-25 LAB — RPR: RPR Ser Ql: NONREACTIVE

## 2021-09-06 ENCOUNTER — Other Ambulatory Visit: Payer: Self-pay

## 2021-09-29 ENCOUNTER — Encounter (HOSPITAL_COMMUNITY): Payer: Self-pay

## 2021-09-29 ENCOUNTER — Ambulatory Visit (INDEPENDENT_AMBULATORY_CARE_PROVIDER_SITE_OTHER): Payer: No Payment, Other | Admitting: Licensed Clinical Social Worker

## 2021-09-29 DIAGNOSIS — F320 Major depressive disorder, single episode, mild: Secondary | ICD-10-CM

## 2021-09-29 DIAGNOSIS — F411 Generalized anxiety disorder: Secondary | ICD-10-CM

## 2021-09-29 NOTE — Plan of Care (Signed)
Treatment plan updated

## 2021-09-29 NOTE — Progress Notes (Signed)
   THERAPIST PROGRESS NOTE  Session Time: 47  Participation Level: Active  Behavioral Response: CasualAlertAngry and Anxious  Type of Therapy: Individual Therapy  Treatment Goals addressed: Treatment plan updated and signed   ProgressTowards Goals: Progressing  Interventions: CBT, Motivational Interviewing, and Strength-based  Summary: Preston [name] is a 41 y.o. male who presents with flat and depressed mood\affect.  Patient was alert and oriented x5.  Patient was pleasant, cooperative, maintained good eye contact.  He was dressed casually and engaged well in therapy session.  Reports primary stressors as relationship.  Patient reports that he has been on and off and on relationship for the past year and a half.  Patient reports that he has struggles with in his relationship for boundary setting and communication techniques.  Patient reports that although they have broken up they still have constant communication.  Patient reports that he got off of work at 1:30 in the morning and called her because one of his friends told him that she was "entertaining another guy".  Patient reports the conversation did not go well when he called at 1:30 in the morning and that the situation escalated to screaming and yelling.  Suicidal/Homicidal: Nowithout intent/plan  Therapist Response:    Intervention/Plan: LCSW provided psychoeducation on worksheet for "tips for setting healthy boundaries".  LCSW went over education material in session and provided a physical copy after the session was completed.  LCSW spoke about healthy communication techniques.  LCSW used CBT therapy for cognitive restructuring.  LCSW used praise and encouragement for supportive therapy.  Plan: Return again in 4 weeks.  Diagnosis: Mild major depression, single episode (HCC)  GAD (generalized anxiety disorder)  Collaboration of Care: Other none in todays session   Patient/Guardian was advised Release of Information must be  obtained prior to any record release in order to collaborate their care with an outside provider. Patient/Guardian was advised if they have not already done so to contact the registration department to sign all necessary forms in order for [place] to release information regarding their care.   Consent: Patient/Guardian gives verbal consent for treatment and assignment of benefits for services provided during this visit. Patient/Guardian expressed understanding and agreed to proceed.   [name], LCSW 09/29/2021

## 2021-10-08 ENCOUNTER — Other Ambulatory Visit: Payer: Self-pay

## 2021-10-11 ENCOUNTER — Other Ambulatory Visit: Payer: Self-pay

## 2021-11-10 ENCOUNTER — Ambulatory Visit (INDEPENDENT_AMBULATORY_CARE_PROVIDER_SITE_OTHER): Payer: No Payment, Other | Admitting: Licensed Clinical Social Worker

## 2021-11-10 ENCOUNTER — Other Ambulatory Visit: Payer: Self-pay

## 2021-11-10 DIAGNOSIS — F320 Major depressive disorder, single episode, mild: Secondary | ICD-10-CM | POA: Diagnosis not present

## 2021-11-10 DIAGNOSIS — F411 Generalized anxiety disorder: Secondary | ICD-10-CM
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
## 2021-11-10 NOTE — Plan of Care (Signed)
  Problem: Anger Management Problem  1 IDD  Goal: Decrease GAD-7 below 5  Outcome: Progressing   Problem: Anger Management Problem  1 IDD  Goal: Attend 1 mental health support group meeting monthly for 6 months.  Outcome: Not Progressing   Problem: Anger Management Problem  1 IDD  Intervention: Marjory Lies ON ANGER MANAGEMENT SKILLS AND THE RATIONALE FOR LEARNING THESE SKILLS Intervention: EDUCATE Tyl[employer] ON "ANGER PAYOFFS" (INTERNAL OR EXTERNAL REINFORCERS OF ANGER RESPONSE) AND THE RATIONALE FOR IDENTIFYING REINFORCERS OF ANGRY RESPONSES Intervention: Marjory Lies ON RELAXATION TECHNIQUES AND THE RATIONALE FOR LEARNING THESE TECHNIQUES Intervention: ENCOURAGE Janie TO PRACTICE RELAXATION SKILLS AT HOME FOR 10 MINUTES, 2 TIMES PER DAY, FOR 7 DAYS Intervention: WORK WITH [employer] TO IDENTIFY AT LEAST 1 TRIGGER THOUGHTS (THOUGHTS THAT TRIGGER AN ANGER RESPONSE)

## 2021-12-08 ENCOUNTER — Ambulatory Visit (INDEPENDENT_AMBULATORY_CARE_PROVIDER_SITE_OTHER): Payer: No Payment, Other | Admitting: Licensed Clinical Social Worker

## 2021-12-08 DIAGNOSIS — F320 Major depressive disorder, single episode, mild: Secondary | ICD-10-CM

## 2021-12-08 DIAGNOSIS — F411 Generalized anxiety disorder: Secondary | ICD-10-CM

## 2021-12-08 NOTE — Progress Notes (Signed)
   THERAPIST PROGRESS NOTE  Session Time: 87  Participation Level: Active  Behavioral Response: CasualAlertAnxious and Depressed  Type of Therapy: Individual Therapy  Treatment Goals addressed: Treatment plan updated to reflect new goal of identifying 3 triggers for depression and anxiety  ProgressTowards Goals: Progressing  Interventions: CBT, Motivational Interviewing, and Supportive  Summary: Preston [name] is a 41 y.o. male who presents with depressed and tearful mood\affect.  Patient was pleasant, cooperative, maintained good eye contact.  He engaged well in therapy session was dressed casually.  Patient was alert and oriented x5.  Patient reports primary stressors as family conflict, ex relationship, and work.  Patient reports in recent days he has found out that his 57-year-old daughter has type 1 diabetes.  Patient reports that he has been attempting to educate himself on the diagnosis but feels "helpless".  Patient reports that he has to be strong for his daughter and for his daughter's mother.  Patient reports that he has not been able to address his own personal feelings about this matter.  Patient was tearful and remorseful talking about the situation in session.  Patient reports that he has been neglecting his needs such as his own health, exercise, and career goals.  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  LCSW utilized motivational interviewing for open-ended questions, reflective listening, and positive affirmations.  LCSW utilized CBT therapy to educate patient on cognitive restructuring.  LCSW spoke about prioritization of self and self-care.  LCSW used person centered therapy for empowerment.  LCSW psychoanalytic therapy for patient to express thoughts, feelings  and emotions in session.  Plan: Return again in 3 weeks.  Diagnosis: No diagnosis found.  Collaboration of Care: Other none today   Patient/Guardian was advised Release of Information must be obtained prior to any record release in order to collaborate their care with an outside provider. Patient/Guardian was advised if they have not already done so to contact the registration department to sign all necessary forms in order for [place] to release information regarding their care.   Consent: Patient/Guardian gives verbal consent for treatment and assignment of benefits for services provided during this visit. Patient/Guardian expressed understanding and agreed to proceed.   [name], LCSW 12/08/2021

## 2021-12-08 NOTE — Plan of Care (Signed)
Treatment plan goal updated

## 2021-12-09 ENCOUNTER — Other Ambulatory Visit: Payer: Self-pay

## 2021-12-09 ENCOUNTER — Other Ambulatory Visit (INDEPENDENT_AMBULATORY_CARE_PROVIDER_SITE_OTHER): Payer: Self-pay | Admitting: Primary Care

## 2021-12-09 DIAGNOSIS — K219 Gastro-esophageal reflux disease without esophagitis: Secondary | ICD-10-CM

## 2021-12-09 MED ORDER — PANTOPRAZOLE SODIUM 40 MG PO TBEC
DELAYED_RELEASE_TABLET | Freq: Every day | ORAL | 0 refills | Status: DC
  Filled 2021-12-09: qty 30, 30d supply, fill #0
  Filled 2021-12-16: qty 90, 90d supply, fill #0

## 2021-12-09 NOTE — Telephone Encounter (Signed)
Routed to PCP 

## 2021-12-15 ENCOUNTER — Other Ambulatory Visit: Payer: Self-pay

## 2021-12-16 ENCOUNTER — Other Ambulatory Visit: Payer: Self-pay

## 2021-12-29 ENCOUNTER — Ambulatory Visit (HOSPITAL_COMMUNITY): Payer: No Payment, Other | Admitting: Licensed Clinical Social Worker

## 2021-12-29 ENCOUNTER — Encounter (HOSPITAL_COMMUNITY): Payer: Self-pay

## 2022-01-19 ENCOUNTER — Ambulatory Visit (INDEPENDENT_AMBULATORY_CARE_PROVIDER_SITE_OTHER): Payer: No Payment, Other | Admitting: Licensed Clinical Social Worker

## 2022-01-19 DIAGNOSIS — F6381 Intermittent explosive disorder: Secondary | ICD-10-CM

## 2022-01-19 DIAGNOSIS — F320 Major depressive disorder, single episode, mild: Secondary | ICD-10-CM | POA: Diagnosis not present
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
## 2022-02-23 ENCOUNTER — Ambulatory Visit (INDEPENDENT_AMBULATORY_CARE_PROVIDER_SITE_OTHER): Payer: No Payment, Other | Admitting: Licensed Clinical Social Worker

## 2022-02-23 DIAGNOSIS — F6381 Intermittent explosive disorder: Secondary | ICD-10-CM

## 2022-02-23 DIAGNOSIS — F320 Major depressive disorder, single episode, mild: Secondary | ICD-10-CM

## 2022-02-23 DIAGNOSIS — F411 Generalized anxiety disorder: Secondary | ICD-10-CM
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
## 2022-02-23 NOTE — Plan of Care (Signed)
  Problem: Anger Management Problem  1 IDD  Goal: Decrease GAD-7 below 5  Outcome: Progressing Goal:  identify three triggers for anxiety and depression Outcome: Progressing   Problem: Anger Management Problem  1 IDD  Goal: Attend 1 mental health support group meeting monthly for 6 months.  Outcome: Not Progressing

## 2022-03-03 ENCOUNTER — Ambulatory Visit (HOSPITAL_COMMUNITY)
Admission: EM | Admit: 2022-03-03 | Discharge: 2022-03-03 | Disposition: A | Discharge status: Home / Self Care | Payer: Medicaid Other | Attending: Emergency Medicine | Admitting: Emergency Medicine

## 2022-03-03 ENCOUNTER — Other Ambulatory Visit: Payer: Self-pay

## 2022-03-03 ENCOUNTER — Encounter (HOSPITAL_COMMUNITY): Payer: Self-pay | Admitting: Emergency Medicine

## 2022-03-03 DIAGNOSIS — Z113 Encounter for screening for infections with a predominantly sexual mode of transmission: Secondary | ICD-10-CM

## 2022-03-03 DIAGNOSIS — Z202 Contact with and (suspected) exposure to infections with a predominantly sexual mode of transmission: Secondary | ICD-10-CM

## 2022-03-03 LAB — HIV ANTIBODY (ROUTINE TESTING W REFLEX): HIV Screen 4th Generation wRfx: NONREACTIVE

## 2022-03-03 NOTE — Discharge Instructions (Signed)
The results of your STD testing today which screens for gonorrhea, chlamydia, and trichomonas will be made posted to your MyChart account once it is complete.  This typically takes 2 to 4 days.  Please abstain from sexual intercourse of any kind, vaginal, oral or anal, until you have received the results of your STD testing.     If any of your results are abnormal, you will receive a phone call regarding treatment.  Prescriptions, if any are needed, will be provided for you at your pharmacy.     Thank you for visiting urgent care today.  I appreciate the opportunity to participate in your care.  

## 2022-03-03 NOTE — ED Triage Notes (Signed)
Denies any std symptoms.  Patient just wanting to get checked

## 2022-03-03 NOTE — ED Provider Notes (Signed)
[place]    CSN: [id number] Arrival date & time: 03/03/22  1524    HISTORY   Chief Complaint  Patient presents with   SEXUALLY TRANSMITTED DISEASE   HPI Preston [name] is a pleasant, 41 y.o. male who presents to urgent care today. Patient presents urgent care requesting screening for STDs.  Patient denies penile discharge, genital lesions, testicular pain or swelling, scrotal pain or swelling, perineal pain, pain with defecation, abdominal pain, fever, body aches, chills, rash.  Patient tested positive for trichomonas in March 2023.  The history is provided by the patient.   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 Authorizing Provider    Family History Family History  Problem Relation Age of Onset   Cancer Father    Diabetes Father    Heart attack Other    Social History Social History   Tobacco Use   Smoking status: Some Days    Packs/day: 0.00    Types: Cigars, Cigarettes   Smokeless tobacco: Never   Tobacco comments:    2 black and milds daily.   Vaping Use   Vaping Use: Never used  Substance Use Topics   Alcohol use: Not Currently    Alcohol/week: 3.0 standard drinks of alcohol    Types: 3 Standard drinks or equivalent per week    Comment: occasional use   Drug use:  Not Currently    Types: Marijuana   Allergies   Other, Red dye, and Yellow dyes (non-tartrazine)  Review of Systems Review of Systems Pertinent findings revealed after performing a 14 point review of systems has been noted in the history of present illness.  Physical Exam Triage Vital Signs ED Triage Vitals  Enc Vitals Group     BP 02/12/21 0827 (!) 147/82     Pulse Rate 02/12/21 0827 72     Resp 02/12/21 0827 18     Temp 02/12/21 0827 98.3 F (36.8 C)     Temp Source 02/12/21 0827 Oral     SpO2 02/12/21 0827 98 %     Weight --      Height --      Head Circumference --      Peak Flow --      Pain Score 02/12/21 0826 5     Pain Loc --      Pain Edu? --      Excl. in [place]? --   No data found.  Updated Vital Signs BP 119/81 (BP Location: Right Arm) Comment (BP Location): large cuff  Pulse 86   Temp 98.2 F (36.8 C) (Oral)   Resp 20   SpO2 97%   Physical Exam Vitals and nursing note reviewed.  Constitutional:      General: He is not in  acute distress.    Appearance: Normal appearance. He is not ill-appearing.  HENT:     Head: Normocephalic and atraumatic.  Eyes:     General: Lids are normal.        Right eye: No discharge.        Left eye: No discharge.     Extraocular Movements: Extraocular movements intact.     Conjunctiva/sclera: Conjunctivae normal.     Right eye: Right conjunctiva is not injected.     Left eye: Left conjunctiva is not injected.  Neck:     Trachea: Trachea and phonation normal.  Cardiovascular:     Rate and Rhythm: Normal rate and regular rhythm.     Pulses: Normal pulses.     Heart sounds: Normal heart sounds. No murmur heard.    No friction rub. No gallop.  Pulmonary:     Effort: Pulmonary effort is normal. No accessory muscle usage, prolonged expiration or respiratory distress.     Breath sounds: Normal breath sounds. No stridor, decreased air movement or transmitted upper airway sounds. No decreased breath sounds, wheezing, rhonchi or  rales.  Chest:     Chest wall: No tenderness.  Genitourinary:    Comments: Pt politely declines GU exam, pt did provide a penile swab for testing.   Musculoskeletal:        General: Normal range of motion.     Cervical back: Normal range of motion and neck supple. Normal range of motion.  Lymphadenopathy:     Cervical: No cervical adenopathy.  Skin:    General: Skin is warm and dry.     Findings: No erythema or rash.  Neurological:     General: No focal deficit present.     Mental Status: He is alert and oriented to person, place, and time.  Psychiatric:        Mood and Affect: Mood normal.        Behavior: Behavior normal.     Visual Acuity Right Eye Distance:   Left Eye Distance:   Bilateral Distance:    Right Eye Near:   Left Eye Near:    Bilateral Near:     UC Couse / Diagnostics / Procedures:     Radiology No results found.  Procedures Procedures (including critical care time) EKG  Pending results:  Labs Reviewed  CYTOLOGY, (ORAL, ANAL, URETHRAL) ANCILLARY ONLY    Medications Ordered in UC: Medications - No data to display  UC Diagnoses / Final Clinical Impressions(s)   I have reviewed the triage vital signs and the nursing notes.  Pertinent labs & imaging results that were available during my care of the patient were reviewed by me and considered in my medical decision making (see chart for details).    Final diagnoses:  Screening examination for STD (sexually transmitted disease)    STD screening was performed, patient advised that the results be posted to their MyChart and if any of the results are positive, they will be notified by phone, further treatment will be provided as indicated based on results of STD screening. Patient was advised to abstain from sexual intercourse until that they receive the results of their STD testing.  Patient was also advised to use condoms to protect themselves from STD exposure. Return precautions advised.  Drug  allergies reviewed, all questions addressed.     ED Prescriptions   None    PDMP not reviewed this encounter.  Disposition Upon Discharge:  Condition: stable for discharge home  Patient presented  with concern for an acute illness with associated systemic symptoms and significant discomfort requiring urgent management. In my opinion, this is a condition that a prudent lay person (someone who possesses an average knowledge of health and medicine) may potentially expect to result in complications if not addressed urgently such as respiratory distress, impairment of bodily function or dysfunction of bodily organs.   As such, the patient has been evaluated and assessed, work-up was performed and treatment was provided in alignment with urgent care protocols and evidence based medicine.  Patient/parent/caregiver has been advised that the patient may require follow up for further testing and/or treatment if the symptoms continue in spite of treatment, as clinically indicated and appropriate.  Routine symptom specific, illness specific and/or disease specific instructions were discussed with the patient and/or caregiver at length.  Prevention strategies for avoiding STD exposure were also discussed.  The patient will follow up with their current PCP if and as advised. If the patient does not currently have a PCP we will assist them in obtaining one.   The patient may need specialty follow up if the symptoms continue, in spite of conservative treatment and management, for further workup, evaluation, consultation and treatment as clinically indicated and appropriate.  Patient/parent/caregiver verbalized understanding and agreement of plan as discussed.  All questions were addressed during visit.  Please see discharge instructions below for further details of plan.  Discharge Instructions:   Discharge Instructions      The results of your STD testing today which screens for gonorrhea, chlamydia,  and trichomonas will be made posted to your MyChart account once it is complete.  This typically takes 2 to 4 days.  Please abstain from sexual intercourse of any kind, vaginal, oral or anal, until you have received the results of your STD testing.     If any of your results are abnormal, you will receive a phone call regarding treatment.  Prescriptions, if any are needed, will be provided for you at your pharmacy.     Thank you for visiting urgent care today.  I appreciate the opportunity to participate in your care.       This office note has been dictated using Teaching laboratory technician.  Unfortunately, this method of dictation can sometimes lead to typographical or grammatical errors.  I apologize for your inconvenience in advance if this occurs.  Please do not hesitate to reach out to me if clarification is needed.       Theadora Rama Scales, PA-C 03/03/22 1710

## 2022-03-04 LAB — CYTOLOGY, (ORAL, ANAL, URETHRAL) ANCILLARY ONLY
Chlamydia: NEGATIVE
Comment: NEGATIVE
Comment: NEGATIVE
Comment: NORMAL
Neisseria Gonorrhea: NEGATIVE
Trichomonas: NEGATIVE

## 2022-03-04 LAB — RPR: RPR Ser Ql: NONREACTIVE

## 2022-03-16 ENCOUNTER — Ambulatory Visit (INDEPENDENT_AMBULATORY_CARE_PROVIDER_SITE_OTHER): Payer: No Payment, Other | Admitting: Licensed Clinical Social Worker

## 2022-03-16 DIAGNOSIS — F6381 Intermittent explosive disorder: Secondary | ICD-10-CM | POA: Diagnosis not present

## 2022-03-16 DIAGNOSIS — F320 Major depressive disorder, single episode, mild: Secondary | ICD-10-CM

## 2022-03-16 DIAGNOSIS — F411 Generalized anxiety disorder: Secondary | ICD-10-CM

## 2022-03-16 NOTE — Plan of Care (Signed)
  Problem: Anger Management Problem  1 IDD  Goal:  identify three triggers for anxiety and depression Outcome: Progressing   Problem: Anger Management Problem  1 IDD  Goal: Attend 1 mental health support group meeting monthly for 6 months.  Outcome: Not Progressing   Problem: Anger Management Problem  1 IDD  Goal: Decrease GAD-7 below 5  Outcome: Completed/Met   Problem: Anger Management Problem  1 IDD  Intervention: EDUCATE Preston [name] ON THE 6 CATEGORIES OF COGNITIVE DISTORTION AND HOW THEY PROMOTE ANGER Intervention: WORK WITH Preston [name] TO DEVELOP A COPING PLAN

## 2022-03-16 NOTE — Progress Notes (Signed)
   THERAPIST PROGRESS NOTE  Session Time: 8  Participation Level: Active  Behavioral Response: CasualAlertAnxious and Depressed  Type of Therapy: Individual Therapy  Treatment Goals addressed:  identify three triggers for anxiety and depression   ProgressTowards Goals: Progressing  Interventions: CBT, Motivational Interviewing, and Supportive  Summary: Preston [name] is a 41 y.o. male who presents with depressed, tearful, and anxious mood\affect.  Patient was pleasant, cooperative, maintained good eye contact.  Melecio was alert and oriented x 5.  Patient reports primary stressors as ex relationship, financials, and his children's mother.  Patient reports that he has been stuck struggling with his friendship with a girl named Hospital doctor.  Patient reports that they have had sexual relations in the past and she has been pregnant.  Patient reports that she got an abortion without input from [place] and patient reports that although their friendship has been maintained over the years patient still has resentment towards her.  Patient reports having communication with her about the overall decision and how it has have affected him long-term.   Other stressors for patient are one of his children's mother.  Patient reports having twins with a woman and he has not had contact with him in multiple years.  Patient reports that the mother of the children has held the garage over him due to not wanting to have a romantic relationship with her.  Nehemiah reports having opportunities to be able to see the twins without her or the mother's knowledge but reports that he has denied those opportunities.  Suicidal/Homicidal: Nowithout intent/plan  Therapist Response:     Intervention/Plan: LCSW psychoanalytic therapy for patient to express thoughts, feelings, and emotions.  LCSW used supportive therapy for praise and encouragement.  LCSW used person centered therapy for empowerment.  LCSW used motivational  interviewing for open-ended questions, reflective listening, and open-ended questions.  Plan for patient is to continue to utilize coping skills such as work and working out.  [place] to also finding independent housing.  Plan: Return again in 3 weeks.  Diagnosis: Intermittent explosive disorder in adult  GAD (generalized anxiety disorder)  Mild major depression, single episode (HCC)  Collaboration of Care: Other None today  Patient/Guardian was advised Release of Information must be obtained prior to any record release in order to collaborate their care with an outside provider. Patient/Guardian was advised if they have not already done so to contact the registration department to sign all necessary forms in order for [place] to release information regarding their care.   Consent: Patient/Guardian gives verbal consent for treatment and assignment of benefits for services provided during this visit. Patient/Guardian expressed understanding and agreed to proceed.   [name], LCSW 03/16/2022

## 2022-03-23 ENCOUNTER — Other Ambulatory Visit: Payer: Self-pay

## 2022-03-23 ENCOUNTER — Other Ambulatory Visit (INDEPENDENT_AMBULATORY_CARE_PROVIDER_SITE_OTHER): Payer: Self-pay | Admitting: Primary Care

## 2022-03-23 DIAGNOSIS — K219 Gastro-esophageal reflux disease without esophagitis: Secondary | ICD-10-CM

## 2022-03-23 NOTE — Telephone Encounter (Signed)
Requested medication (s) are due for refill today: -  Requested medication (s) are on the active medication list: no  Last refill:  12/09/21  Future visit scheduled: no  Notes to clinic:  med dc'd at Urgent Care on 03/03/22 by Hanley Ben PA-C   Requested Prescriptions  Pending Prescriptions Disp Refills   pantoprazole (PROTONIX) 40 MG tablet 90 tablet 0    Sig: TAKE 1 TABLET (40 MG TOTAL) BY MOUTH DAILY.     Gastroenterology: Proton Pump Inhibitors Passed - 03/23/2022  9:12 AM      Passed - Valid encounter within last 12 months    Recent Outpatient Visits           9 months ago Gastroesophageal reflux disease, unspecified whether esophagitis present   [facility] Grayce Sessions, NP   1 year ago Gastroesophageal reflux disease, unspecified whether esophagitis present   [facility] Grayce Sessions, NP   2 years ago Annual visit for general adult medical examination with abnormal findings   [facility] Grayce Sessions, NP   5 years ago Anxiety   [facility] [place], [place], FNP   6 years ago Knee pain, acute, left   [facility] [place], [place], [place]

## 2022-04-04 ENCOUNTER — Other Ambulatory Visit: Payer: Self-pay

## 2022-04-05 ENCOUNTER — Ambulatory Visit (INDEPENDENT_AMBULATORY_CARE_PROVIDER_SITE_OTHER): Payer: Medicaid Other | Admitting: Licensed Clinical Social Worker

## 2022-04-05 ENCOUNTER — Encounter (HOSPITAL_COMMUNITY): Payer: Self-pay

## 2022-04-05 ENCOUNTER — Other Ambulatory Visit: Payer: Self-pay

## 2022-04-05 DIAGNOSIS — F320 Major depressive disorder, single episode, mild: Secondary | ICD-10-CM

## 2022-04-05 DIAGNOSIS — F6381 Intermittent explosive disorder: Secondary | ICD-10-CM

## 2022-04-05 DIAGNOSIS — F411 Generalized anxiety disorder: Secondary | ICD-10-CM

## 2022-04-05 NOTE — Progress Notes (Signed)
   THERAPIST PROGRESS NOTE  Session Time: 23  Participation Level: Active  Behavioral Response: CasualAlertAnxious and Depressed  Type of Therapy: Individual Therapy  Treatment Goals addressed: Problem: Anger Management Problem  1 IDD  Goal:  identify three triggers for anxiety and depression Outcome: Progressing  ProgressTowards Goals: Progressing  Interventions: CBT, Motivational Interviewing, and Supportive  Summary: Preston [name] is a 41 y.o. male who presents with anxious and depressed mood\affect.  Patient was pleasant, cooperative, maintained good eye contact.  He engaged well in therapy session was dressed casually.  [place] was alert and oriented x 5.  Patient reports primary stressors as ex relationship.  Patient states that he still has contact with both his ex girlfriend's.  Patient reports that he engaged with his most recent accident at work via phone call.  Patient reports "I regret doing that".  Patient states that it ended up with her blaming him for the break-up.  Patient states that he set boundaries by hanging up the phone.   Other stressors are with his ex girlfriend and abortion without his knowledge.  Patient reports that he had open and honest communication with his ex-girlfriend Restaurant manager, fast food".  Reported how he had felt hurt and misguided by this decision.  Patient reports that he is still continuing on with the friendship.  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  Patient also reports he is looking into getting a certificate for training and also learning more about stocks and bonds.  Suicidal/Homicidal: Nowithout intent/plan  Therapist Response:     Intervention/Plan: LCSW psychoanalytic therapy for patient to express thoughts, feelings and emotions.  LCSW supportive therapy for praise and encouragement.  LCSW spoke with patient today about identifying 3 triggers for depression and anger.  Patient identified work  relationships.  Plan: Return again in 3 weeks.  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 Patient/Guardian was advised if they have not already done so to contact the registration department to sign all necessary forms in order for [place] to release information regarding their care.   Consent: Patient/Guardian gives verbal consent for treatment and assignment of benefits for services provided during this visit. Patient/Guardian expressed understanding and agreed to proceed.   [name], LCSW 04/05/2022

## 2022-04-13 ENCOUNTER — Encounter (INDEPENDENT_AMBULATORY_CARE_PROVIDER_SITE_OTHER): Payer: Self-pay | Admitting: Primary Care

## 2022-04-13 ENCOUNTER — Ambulatory Visit (INDEPENDENT_AMBULATORY_CARE_PROVIDER_SITE_OTHER): Payer: Medicaid Other | Admitting: Primary Care

## 2022-04-13 ENCOUNTER — Other Ambulatory Visit: Payer: Self-pay

## 2022-04-13 VITALS — BP 130/89 | HR 90 | Temp 98.0°F | Ht 77.0 in | Wt 333.0 lb

## 2022-04-13 DIAGNOSIS — Z131 Encounter for screening for diabetes mellitus: Secondary | ICD-10-CM

## 2022-04-13 DIAGNOSIS — K219 Gastro-esophageal reflux disease without esophagitis: Secondary | ICD-10-CM | POA: Diagnosis not present

## 2022-04-13 DIAGNOSIS — R030 Elevated blood-pressure reading, without diagnosis of hypertension: Secondary | ICD-10-CM

## 2022-04-13 DIAGNOSIS — E782 Mixed hyperlipidemia: Secondary | ICD-10-CM

## 2022-04-13 MED ORDER — PANTOPRAZOLE SODIUM 40 MG PO TBEC
40.0000 mg | DELAYED_RELEASE_TABLET | Freq: Every day | ORAL | 1 refills | Status: DC
  Filled 2022-04-13: qty 90, 90d supply, fill #0
  Filled 2022-07-22: qty 90, 90d supply, fill #1

## 2022-04-13 NOTE — Progress Notes (Signed)
[facility]  Preston [name], is a 41 y.o. male  [id number]  [id number]  DOB - 05/12/1980  Chief Complaint  Patient presents with   Medication Refill       Subjective:   Preston [name] is a 41 y.o. male here today for a follow up visit requesting refills for gerd. He has not eaten will get blood work.Bp is elevated.  Patient has No headache, No chest pain, No abdominal pain - No Nausea, No new weakness tingling or numbness, No Cough - shortness of breath  No problems updated.  Allergies  Allergen Reactions   Other Swelling and Other (See Comments)    Gi cocktail "it makes me feel like my throat is closing up"   Red Dye Hives and Itching   Yellow Dyes (Non-Tartrazine) Hives and Itching    Past Medical History:  Diagnosis Date   Acid reflux    Anxiety    Depression    Gastroparesis     Current Outpatient Medications on File Prior to Visit  Medication Sig Dispense Refill   Multiple Vitamin (MULTIVITAMIN WITH MINERALS) TABS tablet Take 1 tablet by mouth daily.     No current facility-administered medications on file prior to visit.    Objective:   Vitals:   04/13/22 1343  BP: 130/89  Pulse: 90  Temp: 98 F (36.7 C)  TempSrc: Oral  SpO2: 98%  Weight: (Abnormal) 333 lb (151 kg)  Height: 6\' 5"  (1.956 m)   Exam General appearance : morbid obese, Awake, alert, not in any distress. Speech Clear. Not toxic looking HEENT: Atraumatic and Normocephalic, pupils equally reactive to light and accomodation Neck: Supple, no JVD. No cervical lymphadenopathy.  Chest: Good air entry bilaterally, no added sounds  CVS: S1 S2 regular, no murmurs.  Abdomen: Bowel sounds present, Non tender and not distended with no gaurding, rigidity or rebound. Extremities: B/L Lower Ext shows no edema, both legs are warm to touch Neurology: Awake alert, and oriented X 3, CN II-XII intact, Non focal Skin: No Rash  Data Review No results found for:  "HGBA1C"  Assessment & Plan  Preston [name] was seen today for medication refill.  Diagnoses and all orders for this visit:  Elevated blood pressure reading without diagnosis of hypertension BP goal - < 130/80. DIET: Limit salt intake, read nutrition labels to check salt content, limit fried and high fatty foods  Avoid using multisymptom OTC cold preparations that generally contain sudafed which can rise BP. Consult with pharmacist on best cold relief products to use for persons with HTN EXERCISE Discussed incorporating exercise such as walking - 30 minutes most days of the week and can do in 10 minute intervals     Gastroesophageal reflux disease, unspecified whether esophagitis present -     pantoprazole (PROTONIX) 40 MG tablet; Take 1 tablet (40 mg total) by mouth daily.  Morbidly obese (HCC) Morbid Obesity is > 40 BMI indicating an excess in caloric intake or underlining conditions. This may lead to other co-morbidities. Educated on lifestyle modifications of diet and exercise which may reduce obesity.    Mixed hyperlipidemia  Healthy lifestyle diet of fruits vegetables fish nuts whole grains and low saturated fat . Foods high in cholesterol or liver, fatty meats,cheese, butter avocados, nuts and seeds, chocolate and fried foods.  -     Lipid Panel  Screening for diabetes mellitus -     Hemoglobin A1c   There are no diagnoses linked to this encounter.   Patient have been  counseled extensively about nutrition and exercise. Other issues discussed during this visit include: low cholesterol diet, weight control and daily exercise, foot care, annual eye examinations at Ophthalmology, importance of adherence with medications and regular follow-up. We also discussed long term complications of uncontrolled diabetes and hypertension.   Return in about 3 months (around 07/13/2022) for Bp ck.  The patient was given clear instructions to go to ER or return to medical center if symptoms don't  improve, worsen or new problems develop. The patient verbalized understanding. The patient was told to call to get lab results if they haven't heard anything in the next week.   This note has been created with Surveyor, quantity. Any transcriptional errors are unintentional.   Kerin Perna, NP 04/13/2022, 2:14 PM

## 2022-04-13 NOTE — Progress Notes (Signed)
Medication refill on acid reflux medication

## 2022-04-13 NOTE — Patient Instructions (Signed)
Calorie Counting for Weight Loss Calories are units of energy. Your body needs a certain number of calories from food to keep going throughout the day. When you eat or drink more calories than your body needs, your body stores the extra calories mostly as fat. When you eat or drink fewer calories than your body needs, your body burns fat to get the energy it needs. Calorie counting means keeping track of how many calories you eat and drink each day. Calorie counting can be helpful if you need to lose weight. If you eat fewer calories than your body needs, you should lose weight. Ask your health care provider what a healthy weight is for you. For calorie counting to work, you will need to eat the right number of calories each day to lose a healthy amount of weight per week. A dietitian can help you figure out how many calories you need in a day and will suggest ways to reach your calorie goal. A healthy amount of weight to lose each week is usually 1-2 lb (0.5-0.9 kg). This usually means that your daily calorie intake should be reduced by 500-750 calories. Eating 1,200-1,500 calories a day can help most women lose weight. Eating 1,500-1,800 calories a day can help most men lose weight. What do I need to know about calorie counting? Work with your health care provider or dietitian to determine how many calories you should get each day. To meet your daily calorie goal, you will need to: Find out how many calories are in each food that you would like to eat. Try to do this before you eat. Decide how much of the food you plan to eat. Keep a food log. Do this by writing down what you ate and how many calories it had. To successfully lose weight, it is important to balance calorie counting with a healthy lifestyle that includes regular activity. Where do I find calorie information?  The number of calories in a food can be found on a Nutrition Facts label. If a food does not have a Nutrition Facts label, try  to look up the calories online or ask your dietitian for help. Remember that calories are listed per serving. If you choose to have more than one serving of a food, you will have to multiply the calories per serving by the number of servings you plan to eat. For example, the label on a package of bread might say that a serving size is 1 slice and that there are 90 calories in a serving. If you eat 1 slice, you will have eaten 90 calories. If you eat 2 slices, you will have eaten 180 calories. How do I keep a food log? After each time that you eat, record the following in your food log as soon as possible: What you ate. Be sure to include toppings, sauces, and other extras on the food. How much you ate. This can be measured in cups, ounces, or number of items. How many calories were in each food and drink. The total number of calories in the food you ate. Keep your food log near you, such as in a pocket-sized notebook or on an app or website on your mobile phone. Some programs will calculate calories for you and show you how many calories you have left to meet your daily goal. What are some portion-control tips? Know how many calories are in a serving. This will help you know how many servings you can have of a certain   food. Use a measuring cup to measure serving sizes. You could also try weighing out portions on a kitchen scale. With time, you will be able to estimate serving sizes for some foods. Take time to put servings of different foods on your favorite plates or in your favorite bowls and cups so you know what a serving looks like. Try not to eat straight from a food's packaging, such as from a bag or box. Eating straight from the package makes it hard to see how much you are eating and can lead to overeating. Put the amount you would like to eat in a cup or on a plate to make sure you are eating the right portion. Use smaller plates, glasses, and bowls for smaller portions and to prevent  overeating. Try not to multitask. For example, avoid watching TV or using your computer while eating. If it is time to eat, sit down at a table and enjoy your food. This will help you recognize when you are full. It will also help you be more mindful of what and how much you are eating. What are tips for following this plan? Reading food labels Check the calorie count compared with the serving size. The serving size may be smaller than what you are used to eating. Check the source of the calories. Try to choose foods that are high in protein, fiber, and vitamins, and low in saturated fat, trans fat, and sodium. Shopping Read nutrition labels while you shop. This will help you make healthy decisions about which foods to buy. Pay attention to nutrition labels for low-fat or fat-free foods. These foods sometimes have the same number of calories or more calories than the full-fat versions. They also often have added sugar, starch, or salt to make up for flavor that was removed with the fat. Make a grocery list of lower-calorie foods and stick to it. Cooking Try to cook your favorite foods in a healthier way. For example, try baking instead of frying. Use low-fat dairy products. Meal planning Use more fruits and vegetables. One-half of your plate should be fruits and vegetables. Include lean proteins, such as chicken, turkey, and fish. Lifestyle Each week, aim to do one of the following: 150 minutes of moderate exercise, such as walking. 75 minutes of vigorous exercise, such as running. General information Know how many calories are in the foods you eat most often. This will help you calculate calorie counts faster. Find a way of tracking calories that works for you. Get creative. Try different apps or programs if writing down calories does not work for you. What foods should I eat?  Eat nutritious foods. It is better to have a nutritious, high-calorie food, such as an avocado, than a food with  few nutrients, such as a bag of potato chips. Use your calories on foods and drinks that will fill you up and will not leave you hungry soon after eating. Examples of foods that fill you up are nuts and nut butters, vegetables, lean proteins, and high-fiber foods such as whole grains. High-fiber foods are foods with more than 5 g of fiber per serving. Pay attention to calories in drinks. Low-calorie drinks include water and unsweetened drinks. The items listed above may not be a complete list of foods and beverages you can eat. Contact a dietitian for more information. What foods should I limit? Limit foods or drinks that are not good sources of vitamins, minerals, or protein or that are high in unhealthy fats. These   include: Candy. Other sweets. Sodas, specialty coffee drinks, alcohol, and juice. The items listed above may not be a complete list of foods and beverages you should avoid. Contact a dietitian for more information. How do I count calories when eating out? Pay attention to portions. Often, portions are much larger when eating out. Try these tips to keep portions smaller: Consider sharing a meal instead of getting your own. If you get your own meal, eat only half of it. Before you start eating, ask for a container and put half of your meal into it. When available, consider ordering smaller portions from the menu instead of full portions. Pay attention to your food and drink choices. Knowing the way food is cooked and what is included with the meal can help you eat fewer calories. If calories are listed on the menu, choose the lower-calorie options. Choose dishes that include vegetables, fruits, whole grains, low-fat dairy products, and lean proteins. Choose items that are boiled, broiled, grilled, or steamed. Avoid items that are buttered, battered, fried, or served with cream sauce. Items labeled as crispy are usually fried, unless stated otherwise. Choose water, low-fat milk,  unsweetened iced tea, or other drinks without added sugar. If you want an alcoholic beverage, choose a lower-calorie option, such as a glass of wine or light beer. Ask for dressings, sauces, and syrups on the side. These are usually high in calories, so you should limit the amount you eat. If you want a salad, choose a garden salad and ask for grilled meats. Avoid extra toppings such as bacon, cheese, or fried items. Ask for the dressing on the side, or ask for olive oil and vinegar or lemon to use as dressing. Estimate how many servings of a food you are given. Knowing serving sizes will help you be aware of how much food you are eating at restaurants. Where to find more information Centers for Disease Control and Prevention: http://www.wolf.info/ U.[place]. Department of Agriculture: http://www.wilson-mendoza.org/ Summary Calorie counting means keeping track of how many calories you eat and drink each day. If you eat fewer calories than your body needs, you should lose weight. A healthy amount of weight to lose per week is usually 1-2 lb (0.5-0.9 kg). This usually means reducing your daily calorie intake by 500-750 calories. The number of calories in a food can be found on a Nutrition Facts label. If a food does not have a Nutrition Facts label, try to look up the calories online or ask your dietitian for help. Use smaller plates, glasses, and bowls for smaller portions and to prevent overeating. Use your calories on foods and drinks that will fill you up and not leave you hungry shortly after a meal. This information is not intended to replace advice given to you by your health care provider. Make sure you discuss any questions you have with your health care provider. Document Revised: 05/16/2019 Document Reviewed: 05/16/2019 Elsevier Patient Education  [employer]. Preventing Hypertension Hypertension, also called high blood pressure, is when the force of blood pumping through the arteries is too strong. Arteries are blood  vessels that carry blood from the heart throughout the body. Often, hypertension does not cause symptoms until blood pressure is very high. It is important to have your blood pressure checked regularly. Diet and lifestyle changes can help you prevent hypertension, and they may make you feel better overall and improve your quality of life. If you already have hypertension, you may control it with diet and lifestyle changes,  as well as with medicine. How can this condition affect me? Over time, hypertension can damage the arteries and decrease blood flow to important parts of the body, including the brain, heart, and kidneys. By keeping your blood pressure in a healthy range, you can help prevent complications like heart attack, heart failure, stroke, kidney failure, and vascular dementia. What can increase my risk? An unhealthy diet and a lack of physical activity can make you more likely to develop high blood pressure. Some other risk factors include: Age. The risk increases with age. Having family members who have had high blood pressure. Having certain health conditions, such as thyroid problems. Being overweight or obese. Drinking too much alcohol or caffeine. Having too much fat, sugar, calories, or salt (sodium) in your diet. Smoking or using illegal drugs. Taking certain medicines, such as antidepressants, decongestants, birth control pills, and NSAIDs, such as ibuprofen. What actions can I take to prevent or manage this condition? Work with your health care provider to make a hypertension prevention plan that works for you. You may be referred for counseling on a healthy diet and physical activity. Follow your plan and keep all follow-up visits. Diet changes Maintain a healthy diet. This includes: Eating less salt (sodium). Ask your health care provider how much sodium is safe for you to have. The general recommendation is to have less than 1 tsp (2,300 mg) of sodium a day. Do not add salt  to your food. Choose low-sodium options when grocery shopping and eating out. Limiting fats in your diet. You can do this by eating low-fat or fat-free dairy products and by eating less red meat. Eating more fruits, vegetables, and whole grains. Make a goal to eat: 1-2 cups of fresh fruits and vegetables each day. 3-4 servings of whole grains each day. Avoiding foods and beverages that have added sugars. Eating fish that contain healthy fats (omega-3 fatty acids), such as mackerel or salmon. If you need help putting together a healthy eating plan, try the DASH diet. This diet is high in fruits, vegetables, and whole grains. It is low in sodium, red meat, and added sugars. DASH stands for Dietary Approaches to Stop Hypertension. Lifestyle changes  Lose weight if you are overweight. Losing just 3-5% of your body weight can help prevent or control hypertension. For example, if your present weight is 200 lb (91 kg), a loss of 3-5% of your weight means losing 6-10 lb (2.7-4.5 kg). Ask your health care provider to help you with a diet and exercise plan to safely lose weight. Get enough exercise. Do at least 150 minutes of moderate-intensity exercise each week. You could do this in short exercise sessions several times a day, or you could do longer exercise sessions a few times a week. For example, you could take a brisk 10-minute walk or bike ride, 3 times a day, for 5 days a week. Find ways to reduce stress, such as exercising, meditating, listening to music, or taking a yoga class. If you need help reducing stress, ask your health care provider. Do not use any products that contain nicotine or tobacco. These products include cigarettes, chewing tobacco, and vaping devices, such as e-cigarettes. Chemicals in tobacco and nicotine products raise your blood pressure each time you use them. If you need help quitting, ask your health care provider. Learn how to check your blood pressure at home. Make sure that  you know your personal target blood pressure, as told by your health care provider. Try to  sleep 7-9 hours per night. Alcohol use Do not drink alcohol if: Your health care provider tells you not to drink. You are pregnant, may be pregnant, or are planning to become pregnant. If you drink alcohol: Limit how much you have to: 0-1 drink a day for women. 0-2 drinks a day for men. Know how much alcohol is in your drink. In the [place] one drink equals one 12 oz bottle of beer (355 mL), one 5 oz glass of wine (148 mL), or one 1 oz glass of hard liquor (44 mL). Medicines In addition to diet and lifestyle changes, your health care provider may recommend medicines to help lower your blood pressure. In general: You may need to try a few different medicines to find what works best for you. You may need to take more than one medicine. Take over-the-counter and prescription medicines only as told by your health care provider. Questions to ask your health care provider What is my blood pressure goal? How can I lower my risk for high blood pressure? How should I monitor my blood pressure at home? Where to find support Your health care provider can help you prevent hypertension and help you keep your blood pressure at a healthy level. Your local hospital or your community may also provide support services and prevention programs. The American Heart Association offers an online support network at supportnetwork.heart.org Where to find more information Learn more about hypertension from: National Heart, Lung, and Blood Institute: www.nhlbi.nih.gov Centers for Disease Control and Prevention: www.cdc.gov American Academy of Family Physicians: familydoctor.org Learn more about the DASH diet from: National Heart, Lung, and Blood Institute: www.nhlbi.nih.gov Contact a health care provider if: You think you are having a reaction to medicines you have taken. You have recurrent headaches or feel dizzy. You  have swelling in your ankles. You have trouble with your vision. Get help right away if: You have sudden, severe chest, back, or abdominal pain or discomfort. You have shortness of breath. You have a sudden, severe headache. These symptoms may be an emergency. Get help right away. Call 911. Do not wait to see if the symptoms will go away. Do not drive yourself to the hospital. Summary Hypertension often does not cause any symptoms until blood pressure is very high. It is important to get your blood pressure checked regularly. Diet and lifestyle changes are important steps in preventing hypertension. By keeping your blood pressure in a healthy range, you may prevent complications like heart attack, heart failure, stroke, and kidney failure. Work with your health care provider to make a hypertension prevention plan that works for you. This information is not intended to replace advice given to you by your health care provider. Make sure you discuss any questions you have with your health care provider. Document Revised: 01/21/2021 Document Reviewed: 01/21/2021 Elsevier Patient Education  2023 Elsevier Inc.  

## 2022-04-14 ENCOUNTER — Other Ambulatory Visit: Payer: Self-pay

## 2022-04-14 ENCOUNTER — Other Ambulatory Visit (INDEPENDENT_AMBULATORY_CARE_PROVIDER_SITE_OTHER): Payer: Self-pay | Admitting: Primary Care

## 2022-04-14 LAB — LIPID PANEL
Chol/HDL Ratio: 5.3 ratio — ABNORMAL HIGH (ref 0.0–5.0)
Cholesterol, Total: 251 mg/dL — ABNORMAL HIGH (ref 100–199)
HDL: 47 mg/dL (ref >39)
LDL Chol Calc (NIH): 183 mg/dL — ABNORMAL HIGH (ref 0–99)
Triglycerides: 119 mg/dL (ref 0–149)
VLDL Cholesterol Cal: 21 mg/dL (ref 5–40)

## 2022-04-14 LAB — HEMOGLOBIN A1C
Est. average glucose Bld gHb Est-mCnc: 131 mg/dL
Hgb A1c MFr Bld: 6.2 % — ABNORMAL HIGH (ref 4.8–5.6)

## 2022-04-14 MED ORDER — ATORVASTATIN CALCIUM 40 MG PO TABS
40.0000 mg | ORAL_TABLET | Freq: Every day | ORAL | 3 refills | Status: DC
  Filled 2022-04-14: qty 90, 90d supply, fill #0

## 2022-04-21 ENCOUNTER — Other Ambulatory Visit: Payer: Self-pay

## 2022-04-26 ENCOUNTER — Ambulatory Visit (INDEPENDENT_AMBULATORY_CARE_PROVIDER_SITE_OTHER): Payer: No Payment, Other | Admitting: Licensed Clinical Social Worker

## 2022-04-26 DIAGNOSIS — F411 Generalized anxiety disorder: Secondary | ICD-10-CM | POA: Diagnosis not present

## 2022-04-26 DIAGNOSIS — F320 Major depressive disorder, single episode, mild: Secondary | ICD-10-CM

## 2022-04-26 DIAGNOSIS — F6381 Intermittent explosive disorder: Secondary | ICD-10-CM
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
## 2022-05-09 ENCOUNTER — Ambulatory Visit (HOSPITAL_COMMUNITY)
Admission: EM | Admit: 2022-05-09 | Discharge: 2022-05-09 | Discharge status: Against Medical Advice | Payer: Medicaid Other

## 2022-05-10 ENCOUNTER — Encounter (HOSPITAL_COMMUNITY): Payer: Self-pay

## 2022-05-10 ENCOUNTER — Ambulatory Visit (HOSPITAL_COMMUNITY)
Admission: EM | Admit: 2022-05-10 | Discharge: 2022-05-10 | Disposition: A | Discharge status: Home / Self Care | Payer: Medicaid Other | Attending: Emergency Medicine | Admitting: Emergency Medicine

## 2022-05-10 DIAGNOSIS — Z1152 Encounter for screening for COVID-19: Secondary | ICD-10-CM | POA: Insufficient documentation

## 2022-05-10 DIAGNOSIS — B349 Viral infection, unspecified: Secondary | ICD-10-CM | POA: Insufficient documentation

## 2022-05-10 LAB — POC INFLUENZA A AND B ANTIGEN (URGENT CARE ONLY)
INFLUENZA A ANTIGEN, POC: NEGATIVE
INFLUENZA B ANTIGEN, POC: NEGATIVE

## 2022-05-10 NOTE — Discharge Instructions (Addendum)
We will call you if any of your test results are positive, you may view these test results on MyChart.  You may continue with the over-the-counter regimens that you have been using for symptom management.  Viral illnesses can take 7 to 10 days before you feel any gradual resolution in your symptoms.  Please continue to stay adequately hydrated, drinking at least 8 cups of water daily.  Make sure to intake nutrient dense foods.   If you develop any severe/worsening symptoms please go to the nearest emergency department for further evaluation.

## 2022-05-10 NOTE — ED Provider Notes (Signed)
[place]    CSN: [id number] Arrival date & time: 05/10/22  1547      History   Chief Complaint Chief Complaint  Patient presents with   Cough   Sore Throat    HPI Preston [name] is a 42 y.o. male.  Patient presents complaining of generalized body aches, nonproductive cough, nasal congestion that started 6 days ago.  Patient reports that he has had episodes of chills and subjective fever.  He denies any known exposure to a viral illness.  He reports that he has had some intermittent throat irritation, he denies any painful swallowing.  He reports that his symptoms appear worsened in the morning.  He has taken over-the-counter TheraFlu with relief of symptoms at night.  He also states that he has used Flonase with minimal relief of symptoms.  He denies any chronic cardiopulmonary problems.    Cough Associated symptoms: chills, fever, myalgias, rhinorrhea and sore throat (Intermittent throat irritation)   Associated symptoms: no chest pain, no ear pain, no shortness of breath and no wheezing   Sore Throat Pertinent negatives include no chest pain, no abdominal pain and no shortness of breath.    Past Medical History:  Diagnosis Date   Acid reflux    Anxiety    Depression    Gastroparesis     Patient Active Problem List   Diagnosis Date Noted   Intermittent explosive disorder in adult 03/02/2021   Mild major depression, single episode ([place]) 03/02/2021   GAD (generalized anxiety disorder) 03/02/2021   Obesity 09/30/2015   Pain in the chest 11/17/2014   Anxiety 11/17/2014   GERD (gastroesophageal reflux disease) 11/17/2014   Adjustment disorder with mixed anxiety and depressed mood 10/07/2014    Past Surgical History:  Procedure Laterality Date   ESOPHAGOGASTRODUODENOSCOPY     no past surgeries         Home Medications    Prior to Admission medications   Medication Sig Start Date End Date Taking? Authorizing Provider  atorvastatin (LIPITOR) 40 MG  tablet Take 1 tablet (40 mg total) by mouth daily. 04/14/22   Kerin Perna, NP  cetirizine (ZYRTEC) 10 MG tablet Take by mouth.    [provider]  Multiple Vitamin (MULTIVITAMIN WITH MINERALS) TABS tablet Take 1 tablet by mouth daily.    [provider]  Multiple Vitamin tablet Take 1 tablet by mouth daily.    [provider]  pantoprazole (PROTONIX) 40 MG tablet Take 1 tablet (40 mg total) by mouth daily. 04/13/22   Kerin Perna, NP    Family History Family History  Problem Relation Age of Onset   Cancer Father    Diabetes Father    Heart attack Other     Social History Social History   Tobacco Use   Smoking status: Some Days    Packs/day: 0.00    Types: Cigars, Cigarettes   Smokeless tobacco: Never   Tobacco comments:    2 black and milds daily.   Vaping Use   Vaping Use: Never used  Substance Use Topics   Alcohol use: Not Currently    Alcohol/week: 3.0 standard drinks of alcohol    Types: 3 Standard drinks or equivalent per week    Comment: occasional use   Drug use: Not Currently    Types: Marijuana     Allergies   Other, Red dye, and Yellow dyes (non-tartrazine)   Review of Systems Review of Systems  Constitutional:  Positive for activity change,  appetite change, chills, fatigue and fever.  HENT:  Positive for congestion, postnasal drip, rhinorrhea and sore throat (Intermittent throat irritation). Negative for ear discharge, ear pain, sinus pressure, sinus pain and trouble swallowing.   Eyes: Negative.   Respiratory:  Positive for cough. Negative for chest tightness, shortness of breath and wheezing.   Cardiovascular:  Negative for chest pain and palpitations.  Gastrointestinal: Negative.  Negative for abdominal pain, diarrhea, nausea and vomiting.  Musculoskeletal:  Positive for myalgias.     Physical Exam Triage Vital Signs ED Triage Vitals [05/10/22 1823]  Enc Vitals Group     BP (!) 147/101     Pulse Rate 79      Resp 18     Temp 98.2 F (36.8 C)     Temp Source Oral     SpO2 99 %     Weight      Height      Head Circumference      Peak Flow      Pain Score      Pain Loc      Pain Edu?      Excl. in Icard?    No data found.  Updated Vital Signs BP (!) 147/101 (BP Location: Left Arm)   Pulse 79   Temp 98.2 F (36.8 C) (Oral)   Resp 18   SpO2 99%   Visual Acuity Right Eye Distance:   Left Eye Distance:   Bilateral Distance:    Right Eye Near:   Left Eye Near:    Bilateral Near:     Physical Exam Vitals and nursing note reviewed.  Constitutional:      Appearance: He is ill-appearing.  HENT:     Right Ear: Hearing, tympanic membrane, ear canal and external ear normal.     Left Ear: Hearing, tympanic membrane, ear canal and external ear normal.     Nose: Congestion and rhinorrhea present. Rhinorrhea is clear.     Right Turbinates: Enlarged and swollen. Not pale.     Left Turbinates: Enlarged and swollen. Not pale.     Right Sinus: No maxillary sinus tenderness or frontal sinus tenderness.     Left Sinus: No maxillary sinus tenderness or frontal sinus tenderness.     Mouth/Throat:     Mouth: Mucous membranes are moist.     Pharynx: Oropharynx is clear. Uvula midline. No pharyngeal swelling, oropharyngeal exudate, posterior oropharyngeal erythema or uvula swelling.     Tonsils: No tonsillar exudate or tonsillar abscesses. 0 on the right. 0 on the left.  Cardiovascular:     Rate and Rhythm: Normal rate and regular rhythm.     Heart sounds: Normal heart sounds, S1 normal and S2 normal.  Pulmonary:     Effort: Pulmonary effort is normal.     Breath sounds: Normal breath sounds and air entry. No decreased breath sounds, wheezing, rhonchi or rales.  Lymphadenopathy:     Head:     Right side of head: No tonsillar adenopathy.     Left side of head: No tonsillar adenopathy.     Cervical: Cervical adenopathy present.     Right cervical: No superficial, deep or posterior cervical  adenopathy.    Left cervical: Superficial cervical adenopathy present. No deep or posterior cervical adenopathy.      UC Treatments / Results  Labs (all labs ordered are listed, but only abnormal results are displayed) Labs Reviewed  SARS CORONAVIRUS 2 (TAT 6-24 HRS)  POC INFLUENZA A AND B ANTIGEN (  URGENT CARE ONLY)    EKG   Radiology No results found.  Procedures Procedures (including critical care time)  Medications Ordered in UC Medications - No data to display  Initial Impression / Assessment and Plan / UC Course  I have reviewed the triage vital signs and the nursing notes.  Pertinent labs & imaging results that were available during my care of the patient were reviewed by me and considered in my medical decision making (see chart for details).     Patient was evaluated for a viral illness.  Final Clinical Impressions(s) / UC Diagnoses   Final diagnoses:  Viral illness   Discharge Instructions   None    ED Prescriptions   None    PDMP not reviewed this encounter.

## 2022-05-10 NOTE — ED Triage Notes (Signed)
Pt reports body aches,cough and nasal congestion for several days.

## 2022-05-11 LAB — SARS CORONAVIRUS 2 (TAT 6-24 HRS): SARS Coronavirus 2: NEGATIVE

## 2022-05-25 ENCOUNTER — Ambulatory Visit (INDEPENDENT_AMBULATORY_CARE_PROVIDER_SITE_OTHER): Payer: Medicaid Other | Admitting: Licensed Clinical Social Worker

## 2022-05-25 DIAGNOSIS — F411 Generalized anxiety disorder: Secondary | ICD-10-CM | POA: Diagnosis not present

## 2022-05-25 DIAGNOSIS — F6381 Intermittent explosive disorder: Secondary | ICD-10-CM

## 2022-05-25 DIAGNOSIS — F320 Major depressive disorder, single episode, mild: Secondary | ICD-10-CM
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
## 2022-06-15 ENCOUNTER — Ambulatory Visit (INDEPENDENT_AMBULATORY_CARE_PROVIDER_SITE_OTHER): Payer: Medicaid Other | Admitting: Licensed Clinical Social Worker

## 2022-06-15 DIAGNOSIS — F6381 Intermittent explosive disorder: Secondary | ICD-10-CM | POA: Diagnosis not present

## 2022-06-15 DIAGNOSIS — F320 Major depressive disorder, single episode, mild: Secondary | ICD-10-CM

## 2022-06-15 DIAGNOSIS — F4323 Adjustment disorder with mixed anxiety and depressed mood: Secondary | ICD-10-CM

## 2022-06-15 NOTE — Progress Notes (Signed)
THERAPIST PROGRESS NOTE  Session Time:45   Participation Level: Active  Behavioral Response: CasualAlertEuthymic  Type of Therapy: Individual Therapy  Treatment Goals addressed:  Active     Anger Management Problem  1 IDD      Decrease PHQ-9 below 5  (Completed/Met)     Start:  03/02/21    Expected End:  09/15/21    Resolved:  06/22/21      Decrease GAD-7 below 5  (Completed/Met)     Start:  03/02/21    Expected End:  06/16/22    Resolved:  03/16/22      Attend 1 mental health support group meeting monthly for 6 months.  (Not Progressing)     Start:  03/02/21    Expected End:  10/14/22         STG: Naaman will identify situations, thoughts, and feelings that trigger internal anger, angry verbal and/or aggressive behavioral actions as evidenced by a self-recorded report. (Completed/Met)     Start:  03/02/21    Expected End:  09/15/21    Resolved:  06/22/21       identify three triggers for anxiety and depression (Progressing)     Start:  12/08/21    Expected End:  10/14/22       Goal Note     Progressing            Anxiety     STG: Preston [name] will complete at least 80% of assigned homework  (Progressing)     Start:  04/26/22    Expected End:  10/14/22         STG: Preston [name] will practice problem solving skills 3 times per week for the next 4 weeks.  (Progressing)     Start:  04/26/22    Expected End:  10/14/22         add three coping skills for anxiety      Start:  04/26/22    Expected End:  10/14/22       Goal Note     Ex realtionship  Work          what are three trigger for anxiety.  (Progressing)     Start:  04/26/22    Expected End:  10/14/22       Goal Note     Work            ProgressTowards Goals: Progressing  Interventions: CBT, Motivational Interviewing, and Supportive  Summary: Preston [name] is a 42 y.o. male who presents with euthymic mood\affect.  Patient was pleasant, cooperative, maintained good eye contact.  He  engaged well in therapy session and was dressed casually.  Patient reports primary stressors as work, family conflict, and relationships.  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.  Preston [name] reports that he still continues to work at [employer] as well as work Land at [employer] .   Other stressors for patient or family conflict with his eldest daughter will be graduating high school.  Preston [name] reports that he is attempted multiple times to contact her but  has not had any contact back.  Patient reports he just would like to see her graduate even if there is no interaction had between the 2.   Final stressor for patient is relationship.  He reports that he still continues to talk to his ex girlfriend as well and has a friend that he has had sexual relation with before.  Patient states that he has been talking to his ex more frequently and believes that she has started to make progress towards becoming more mature.  Preston [name] reports that the other girl he is talking to he will not continue to engage with as there have been some "red flags".  Preston [name] utilized the example of going to a bar to meet up with her and he attempted to call her but he saw her ignore his phone call while talking to another guy.  Suicidal/Homicidal: Nowithout intent/plan  Therapist Response:     Intervention/Plan: Preston [name] used reflective listening and open-ended questions for motivational interviewing.  Preston [name] CBT therapy for reframing.  Preston [name] used empowerment and unconditional positive regard for person centered therapy.  Preston [name] used psychoanalytic therapy for  patient to express thoughts, feelings and emotions.  Plan: Return again in 3 weeks.  Diagnosis: Intermittent explosive disorder in adult  Adjustment disorder with mixed anxiety and depressed mood  Mild major depression, single episode ([place])  Collaboration of Care: Other None today   Patient/Guardian was advised Release of Information must be obtained prior to any record release in order to collaborate their care with an outside provider. Patient/Guardian was advised if they have not already done so to contact the registration department to sign all necessary forms in order for [place] to release information regarding their care.   Consent: Patient/Guardian gives verbal consent for treatment and assignment of benefits for services provided during this visit. Patient/Guardian expressed understanding and agreed to proceed.   Dory Horn, Preston [name] 06/15/2022

## 2022-07-05 ENCOUNTER — Ambulatory Visit (INDEPENDENT_AMBULATORY_CARE_PROVIDER_SITE_OTHER): Payer: Medicaid Other | Admitting: Licensed Clinical Social Worker

## 2022-07-05 DIAGNOSIS — F6381 Intermittent explosive disorder: Secondary | ICD-10-CM

## 2022-07-05 DIAGNOSIS — F411 Generalized anxiety disorder: Secondary | ICD-10-CM | POA: Diagnosis not present

## 2022-07-05 DIAGNOSIS — F320 Major depressive disorder, single episode, mild: Secondary | ICD-10-CM | POA: Diagnosis not present

## 2022-07-05 NOTE — Progress Notes (Signed)
THERAPIST PROGRESS NOTE  Session Time: 87  Participation Level: Active  Behavioral Response: CasualAlertAnxious and Depressed  Type of Therapy: Individual Therapy  Treatment Goals addressed:  Active     Anger Management Problem  1 IDD      Decrease PHQ-9 below 5  (Completed/Met)     Start:  03/02/21    Expected End:  09/15/21    Resolved:  06/22/21      Decrease GAD-7 below 5  (Completed/Met)     Start:  03/02/21    Expected End:  06/16/22    Resolved:  03/16/22      Attend 1 mental health support group meeting monthly for 6 months.  (Not Progressing)     Start:  03/02/21    Expected End:  10/14/22         STG: Preston [name] will identify situations, thoughts, and feelings that trigger internal anger, angry verbal and/or aggressive behavioral actions as evidenced by a self-recorded report. (Completed/Met)     Start:  03/02/21    Expected End:  09/15/21    Resolved:  06/22/21       identify three triggers for anxiety and depression (Progressing)     Start:  12/08/21    Expected End:  10/14/22           Anxiety     STG: Preston [name] will complete at least 80% of assigned homework  (Progressing)     Start:  04/26/22    Expected End:  10/14/22         STG: Preston [name] will practice problem solving skills 3 times per week for the next 4 weeks.  (Progressing)     Start:  04/26/22    Expected End:  10/14/22         add three coping skills for anxiety      Start:  04/26/22    Expected End:  10/14/22       Goal Note     Ex realtionship  Work          what are three trigger for anxiety.  (Progressing)     Start:  04/26/22    Expected End:  10/14/22            ProgressTowards Goals: Progressing  Interventions: CBT, Motivational Interviewing, and Supportive  Summary: Preston [name] is a 42 y.o. male who presents with depressed and anxious mood\affect.  Patient was pleasant, cooperative, maintained good eye contact.  He engaged well in therapy session was dressed  casually.   Patient reports primary stressors are relationships.  He reports that his ex partner found out that he was cheating on her with the person that he was talking to for the past several months.  Patient states that he is "fearful" due to the instability of the girl Preston [name] that he has been talking to for the past several months.  Patient reports that he continues to try to progress his relationship with Preston [name] but things have been difficult with the news of him cheating on her with this other girl. Suicidal/Homicidal: Nowithout intent/plan  Therapist Response:     Interventions/Plan: LCSW psychoanalytic therapy for patient to express thoughts, feelings and emotions.  LCSW used motivational interviewing for positive affirmations, open-ended questions, and reflective listening.  LCSW supportive therapy for praise and encouragement.  LCSW educated patient on setting healthy boundaries with partners.  Plan: Return again in 3 weeks.  Diagnosis: Intermittent explosive disorder in adult  Mild major depression, single episode ([facility])  GAD (  generalized anxiety disorder)  Collaboration of Care: Other none today   Patient/Guardian was advised Release of Information must be obtained prior to any record release in order to collaborate their care with an outside provider. Patient/Guardian was advised if they have not already done so to contact the registration department to sign all necessary forms in order for [place] to release information regarding their care.   Consent: Patient/Guardian gives verbal consent for treatment and assignment of benefits for services provided during this visit. Patient/Guardian expressed understanding and agreed to proceed.   Dory Horn, LCSW 07/05/2022

## 2022-07-13 ENCOUNTER — Ambulatory Visit (INDEPENDENT_AMBULATORY_CARE_PROVIDER_SITE_OTHER): Payer: 59 | Admitting: Primary Care

## 2022-07-22 ENCOUNTER — Other Ambulatory Visit: Payer: Self-pay

## 2022-07-22 ENCOUNTER — Other Ambulatory Visit (INDEPENDENT_AMBULATORY_CARE_PROVIDER_SITE_OTHER): Payer: Self-pay | Admitting: Primary Care

## 2022-07-24 MED ORDER — CETIRIZINE HCL 10 MG PO TABS
10.0000 mg | ORAL_TABLET | Freq: Every day | ORAL | 1 refills | Status: DC
  Filled 2022-07-24: qty 90, 90d supply, fill #0
  Filled 2022-10-25: qty 90, 90d supply, fill #1

## 2022-07-25 ENCOUNTER — Other Ambulatory Visit: Payer: Self-pay

## 2022-07-26 ENCOUNTER — Ambulatory Visit (INDEPENDENT_AMBULATORY_CARE_PROVIDER_SITE_OTHER): Payer: 59 | Admitting: Licensed Clinical Social Worker

## 2022-07-26 ENCOUNTER — Other Ambulatory Visit: Payer: Self-pay

## 2022-07-26 DIAGNOSIS — F6381 Intermittent explosive disorder: Secondary | ICD-10-CM | POA: Diagnosis not present

## 2022-07-26 DIAGNOSIS — F411 Generalized anxiety disorder: Secondary | ICD-10-CM

## 2022-07-26 DIAGNOSIS — F320 Major depressive disorder, single episode, mild: Secondary | ICD-10-CM
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
## 2022-07-29 ENCOUNTER — Encounter (INDEPENDENT_AMBULATORY_CARE_PROVIDER_SITE_OTHER): Payer: Self-pay

## 2022-07-29 ENCOUNTER — Ambulatory Visit (INDEPENDENT_AMBULATORY_CARE_PROVIDER_SITE_OTHER): Payer: 59 | Admitting: Primary Care

## 2022-08-16 ENCOUNTER — Ambulatory Visit (INDEPENDENT_AMBULATORY_CARE_PROVIDER_SITE_OTHER): Payer: 59 | Admitting: Licensed Clinical Social Worker

## 2022-08-16 DIAGNOSIS — F6381 Intermittent explosive disorder: Secondary | ICD-10-CM

## 2022-08-16 DIAGNOSIS — F4323 Adjustment disorder with mixed anxiety and depressed mood: Secondary | ICD-10-CM

## 2022-08-16 DIAGNOSIS — F411 Generalized anxiety disorder: Secondary | ICD-10-CM
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
## 2022-08-31 ENCOUNTER — Ambulatory Visit (HOSPITAL_COMMUNITY): Payer: 59 | Admitting: Licensed Clinical Social Worker

## 2022-09-07 ENCOUNTER — Ambulatory Visit (HOSPITAL_COMMUNITY): Payer: 59 | Admitting: Licensed Clinical Social Worker

## 2022-09-28 ENCOUNTER — Ambulatory Visit (INDEPENDENT_AMBULATORY_CARE_PROVIDER_SITE_OTHER): Payer: 59 | Admitting: Licensed Clinical Social Worker

## 2022-09-28 DIAGNOSIS — F6381 Intermittent explosive disorder: Secondary | ICD-10-CM

## 2022-09-28 DIAGNOSIS — F320 Major depressive disorder, single episode, mild: Secondary | ICD-10-CM

## 2022-09-28 DIAGNOSIS — F411 Generalized anxiety disorder: Secondary | ICD-10-CM
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
## 2022-10-17 ENCOUNTER — Other Ambulatory Visit (INDEPENDENT_AMBULATORY_CARE_PROVIDER_SITE_OTHER): Payer: Self-pay | Admitting: Primary Care

## 2022-10-17 DIAGNOSIS — K219 Gastro-esophageal reflux disease without esophagitis: Secondary | ICD-10-CM

## 2022-10-18 ENCOUNTER — Other Ambulatory Visit: Payer: Self-pay

## 2022-10-18 MED ORDER — PANTOPRAZOLE SODIUM 40 MG PO TBEC
40.0000 mg | DELAYED_RELEASE_TABLET | Freq: Every day | ORAL | 1 refills | Status: DC
  Filled 2022-10-25: qty 30, 30d supply, fill #0
  Filled 2022-11-26: qty 30, 30d supply, fill #1
  Filled 2023-01-02: qty 30, 30d supply, fill #2
  Filled 2023-02-07: qty 30, 30d supply, fill #3
  Filled 2023-03-06: qty 30, 30d supply, fill #4
  Filled 2023-04-14: qty 30, 30d supply, fill #5

## 2022-10-18 NOTE — Telephone Encounter (Signed)
Requested Prescriptions  Pending Prescriptions Disp Refills   pantoprazole (PROTONIX) 40 MG tablet 90 tablet 1    Sig: Take 1 tablet (40 mg total) by mouth daily.     Gastroenterology: Proton Pump Inhibitors Passed - 10/17/2022  5:45 PM      Passed - Valid encounter within last 12 months    Recent Outpatient Visits           6 months ago Elevated blood pressure reading without diagnosis of hypertension   [place] Grayce Sessions, NP   1 year ago Gastroesophageal reflux disease, unspecified whether esophagitis present   [place] [facility] Grayce Sessions, NP   1 year ago Gastroesophageal reflux disease, unspecified whether esophagitis present    [facility] Grayce Sessions, NP   2 years ago Annual visit for general adult medical examination with abnormal findings   [place] [facility] Grayce Sessions, NP   5 years ago Anxiety   [facility] [place], [place], [place]

## 2022-10-25 ENCOUNTER — Other Ambulatory Visit: Payer: Self-pay

## 2022-11-09 ENCOUNTER — Ambulatory Visit (INDEPENDENT_AMBULATORY_CARE_PROVIDER_SITE_OTHER): Payer: 59 | Admitting: Licensed Clinical Social Worker

## 2022-11-09 DIAGNOSIS — F6381 Intermittent explosive disorder: Secondary | ICD-10-CM | POA: Diagnosis not present

## 2022-11-09 DIAGNOSIS — F320 Major depressive disorder, single episode, mild: Secondary | ICD-10-CM

## 2022-11-09 DIAGNOSIS — F411 Generalized anxiety disorder: Secondary | ICD-10-CM

## 2022-11-09 NOTE — Progress Notes (Signed)
   THERAPIST PROGRESS NOTE  Session Time: 34  Participation Level: Active  Behavioral Response: CasualAlertAnxious and Depressed  Type of Therapy: Individual Therapy  Treatment Goals addressed:   ProgressTowards Goals: Progressing  Interventions: CBT, Motivational Interviewing, and Supportive  Summary: Preston [name] is a 42 y.o. male who presents with depressed and anxious mood\affect.  Patient was pleasant, cooperative, maintained good eye contact.  He engaged well in therapy session was dressed casually.  [name] was alert and oriented x 5.  Patient comes in today stating stressors for grief and loss, employment, housing, financials, and family conflict.  Patient reports that his father passed away and was sent to hospice about 2 weeks ago.  Patient reports that his now ex girlfriend [name] was not supportive of him and his time as needed.  Patient reports that he lost his job, lost his father, and got kicked out of his house.  [name] reports that he was kicked out of his house due to the fact that his roommate was not paying rent.  Patient reports that her [name] said derogatory things such as calling him a "loser".  Patient reports that he needs to find himself again.  He reports that he is living with his parents in [place] for the until he can find a place in [place].  Suicidal/Homicidal: Nowithout intent/plan  Therapist Response:    Intervention/Plan: LCSW used motivational interviewing for open-ended questions, reflective lungs, and positive affirmations.  LCSW psychoanalytic therapy for patient to express thoughts, feelings and concerns utilizing free association.  LCSW used a person centered therapy for empowerment and unconditional positive regard utilizing nonjudgmental stance.  LCSW used reframing for cognitive behavioral therapy.   Plan: Return again in 3 weeks.  Diagnosis: Intermittent explosive disorder in adult  GAD (generalized anxiety disorder)  Mild major  depression, single episode (HCC)  Collaboration of Care: Other None today   Patient/Guardian was advised Release of Information must be obtained prior to any record release in order to collaborate their care with an outside provider. Patient/Guardian was advised if they have not already done so to contact the registration department to sign all necessary forms in order for [place] to release information regarding their care.   Consent: Patient/Guardian gives verbal consent for treatment and assignment of benefits for services provided during this visit. Patient/Guardian expressed understanding and agreed to proceed.   [name], LCSW 11/09/2022

## 2022-11-29 ENCOUNTER — Ambulatory Visit (HOSPITAL_COMMUNITY): Payer: 59 | Admitting: Licensed Clinical Social Worker

## 2022-11-30 ENCOUNTER — Ambulatory Visit (HOSPITAL_COMMUNITY): Payer: 59 | Admitting: Licensed Clinical Social Worker

## 2022-11-30 DIAGNOSIS — F320 Major depressive disorder, single episode, mild: Secondary | ICD-10-CM

## 2022-11-30 DIAGNOSIS — F6381 Intermittent explosive disorder: Secondary | ICD-10-CM

## 2022-11-30 DIAGNOSIS — F411 Generalized anxiety disorder: Secondary | ICD-10-CM
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
## 2022-12-20 ENCOUNTER — Encounter (HOSPITAL_COMMUNITY): Payer: Self-pay

## 2022-12-20 ENCOUNTER — Ambulatory Visit (HOSPITAL_COMMUNITY): Payer: 59 | Admitting: Licensed Clinical Social Worker

## 2023-01-02 ENCOUNTER — Other Ambulatory Visit (INDEPENDENT_AMBULATORY_CARE_PROVIDER_SITE_OTHER): Payer: Self-pay | Admitting: Primary Care

## 2023-01-04 MED ORDER — MULTIPLE VITAMIN PO TABS
1.0000 | ORAL_TABLET | Freq: Every day | ORAL | 1 refills | Status: DC
  Filled 2023-01-04 – 2023-02-07 (×2): qty 100, 100d supply, fill #0

## 2023-01-05 ENCOUNTER — Other Ambulatory Visit: Payer: Self-pay

## 2023-01-06 ENCOUNTER — Other Ambulatory Visit: Payer: Self-pay

## 2023-01-11 ENCOUNTER — Ambulatory Visit (INDEPENDENT_AMBULATORY_CARE_PROVIDER_SITE_OTHER): Payer: 59 | Admitting: Licensed Clinical Social Worker

## 2023-01-11 DIAGNOSIS — F320 Major depressive disorder, single episode, mild: Secondary | ICD-10-CM

## 2023-01-11 DIAGNOSIS — F411 Generalized anxiety disorder: Secondary | ICD-10-CM | POA: Diagnosis not present

## 2023-01-11 DIAGNOSIS — F6381 Intermittent explosive disorder: Secondary | ICD-10-CM
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
## 2023-01-16 ENCOUNTER — Other Ambulatory Visit: Payer: Self-pay

## 2023-02-07 ENCOUNTER — Other Ambulatory Visit: Payer: Self-pay

## 2023-02-07 ENCOUNTER — Other Ambulatory Visit (INDEPENDENT_AMBULATORY_CARE_PROVIDER_SITE_OTHER): Payer: Self-pay | Admitting: Primary Care

## 2023-02-08 ENCOUNTER — Other Ambulatory Visit: Payer: Self-pay

## 2023-02-08 MED ORDER — CETIRIZINE HCL 10 MG PO TABS
10.0000 mg | ORAL_TABLET | Freq: Every day | ORAL | 1 refills | Status: DC
  Filled 2023-02-08: qty 30, 30d supply, fill #0
  Filled 2023-03-06: qty 90, 90d supply, fill #0
  Filled 2023-06-12: qty 30, 30d supply, fill #1
  Filled 2023-07-13: qty 30, 30d supply, fill #2
  Filled 2023-08-09: qty 30, 30d supply, fill #3

## 2023-02-09 ENCOUNTER — Other Ambulatory Visit: Payer: Self-pay

## 2023-02-21 ENCOUNTER — Ambulatory Visit (INDEPENDENT_AMBULATORY_CARE_PROVIDER_SITE_OTHER): Payer: 59 | Admitting: Licensed Clinical Social Worker

## 2023-02-21 DIAGNOSIS — F6381 Intermittent explosive disorder: Secondary | ICD-10-CM | POA: Diagnosis not present

## 2023-02-21 DIAGNOSIS — F411 Generalized anxiety disorder: Secondary | ICD-10-CM
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
## 2023-03-07 ENCOUNTER — Other Ambulatory Visit: Payer: Self-pay

## 2023-03-09 ENCOUNTER — Other Ambulatory Visit: Payer: Self-pay

## 2023-03-14 ENCOUNTER — Ambulatory Visit (HOSPITAL_COMMUNITY): Payer: 59 | Admitting: Licensed Clinical Social Worker

## 2023-03-15 ENCOUNTER — Ambulatory Visit (HOSPITAL_COMMUNITY): Payer: 59 | Admitting: Licensed Clinical Social Worker

## 2023-03-21 ENCOUNTER — Ambulatory Visit (HOSPITAL_COMMUNITY): Payer: 59 | Admitting: Licensed Clinical Social Worker

## 2023-04-05 ENCOUNTER — Ambulatory Visit (INDEPENDENT_AMBULATORY_CARE_PROVIDER_SITE_OTHER): Payer: 59 | Admitting: Licensed Clinical Social Worker

## 2023-04-05 DIAGNOSIS — F411 Generalized anxiety disorder: Secondary | ICD-10-CM

## 2023-04-05 DIAGNOSIS — F6381 Intermittent explosive disorder: Secondary | ICD-10-CM

## 2023-04-05 DIAGNOSIS — F320 Major depressive disorder, single episode, mild: Secondary | ICD-10-CM
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
## 2023-04-11 ENCOUNTER — Ambulatory Visit (HOSPITAL_COMMUNITY): Payer: 59 | Admitting: Licensed Clinical Social Worker

## 2023-04-13 ENCOUNTER — Ambulatory Visit (HOSPITAL_COMMUNITY): Payer: 59 | Admitting: Licensed Clinical Social Worker

## 2023-04-14 ENCOUNTER — Other Ambulatory Visit: Payer: Self-pay

## 2023-04-17 ENCOUNTER — Other Ambulatory Visit: Payer: Self-pay

## 2023-04-26 ENCOUNTER — Ambulatory Visit (INDEPENDENT_AMBULATORY_CARE_PROVIDER_SITE_OTHER): Payer: Medicaid Other | Admitting: Licensed Clinical Social Worker

## 2023-04-26 DIAGNOSIS — F6381 Intermittent explosive disorder: Secondary | ICD-10-CM | POA: Diagnosis not present

## 2023-04-26 DIAGNOSIS — F411 Generalized anxiety disorder: Secondary | ICD-10-CM
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
## 2023-05-10 ENCOUNTER — Other Ambulatory Visit (INDEPENDENT_AMBULATORY_CARE_PROVIDER_SITE_OTHER): Payer: Self-pay | Admitting: Primary Care

## 2023-05-10 DIAGNOSIS — K219 Gastro-esophageal reflux disease without esophagitis: Secondary | ICD-10-CM

## 2023-05-10 NOTE — Telephone Encounter (Signed)
Requested medication (s) are due for refill today: yes  Requested medication (s) are on the active medication list: yes  Last refill:  10/18/22 #90/1  Future visit scheduled: no  Notes to clinic:  Unable to refill per protocol, appointment needed.      Requested Prescriptions  Pending Prescriptions Disp Refills   pantoprazole (PROTONIX) 40 MG tablet 90 tablet 1    Sig: Take 1 tablet (40 mg total) by mouth daily.     Gastroenterology: Proton Pump Inhibitors Failed - 05/10/2023  5:29 PM      Failed - Valid encounter within last 12 months    Recent Outpatient Visits           1 year ago Elevated blood pressure reading without diagnosis of hypertension   [place] [facility] Grayce Sessions, NP   1 year ago Gastroesophageal reflux disease, unspecified whether esophagitis present   [place] [facility] Grayce Sessions, NP   2 years ago Gastroesophageal reflux disease, unspecified whether esophagitis present   [place] [facility] Grayce Sessions, NP   3 years ago Annual visit for general adult medical examination with abnormal findings   [place] [facility] Grayce Sessions, NP   6 years ago Anxiety   [facility] [place]. [facility] [place], [place], [place]

## 2023-05-15 ENCOUNTER — Other Ambulatory Visit (INDEPENDENT_AMBULATORY_CARE_PROVIDER_SITE_OTHER): Payer: Self-pay | Admitting: Primary Care

## 2023-05-15 DIAGNOSIS — K219 Gastro-esophageal reflux disease without esophagitis: Secondary | ICD-10-CM

## 2023-05-18 ENCOUNTER — Other Ambulatory Visit (INDEPENDENT_AMBULATORY_CARE_PROVIDER_SITE_OTHER): Payer: Self-pay | Admitting: Primary Care

## 2023-05-18 ENCOUNTER — Other Ambulatory Visit: Payer: Self-pay

## 2023-05-18 DIAGNOSIS — K219 Gastro-esophageal reflux disease without esophagitis: Secondary | ICD-10-CM

## 2023-05-18 NOTE — Telephone Encounter (Signed)
Copied from CRM [id number]. Topic: Clinical - Medication Refill >> May 18, 2023  2:25 PM Geroge Baseman wrote: Most Recent Primary Care Visit:  Provider: Grayce Sessions  Department: [facility]  Visit Type: OFFICE VISIT  Date: 04/13/2022  Medication: pantoprazole (PROTONIX) 40 MG table  Has the patient contacted their pharmacy? Yes Said he had to make an appointment  Is this the correct pharmacy for this prescription? Yes If no, delete pharmacy and type the correct one.  This is the patient's preferred pharmacy:  [facility] [address] [place] [address] Phone: [phone] Fax: [phone]   Has the prescription been filled recently? No  Is the patient out of the medication? Yes  Has the patient been seen for an appointment in the last year OR does the patient have an upcoming appointment? Yes, scheduled one for February 5th  Can we respond through MyChart? No  Agent: Please be advised that Rx refills may take up to 3 business days. We ask that you follow-up with your pharmacy.

## 2023-05-19 ENCOUNTER — Other Ambulatory Visit (INDEPENDENT_AMBULATORY_CARE_PROVIDER_SITE_OTHER): Payer: Self-pay | Admitting: Primary Care

## 2023-05-19 ENCOUNTER — Other Ambulatory Visit: Payer: Self-pay

## 2023-05-19 DIAGNOSIS — K219 Gastro-esophageal reflux disease without esophagitis: Secondary | ICD-10-CM

## 2023-05-19 MED ORDER — PANTOPRAZOLE SODIUM 40 MG PO TBEC
40.0000 mg | DELAYED_RELEASE_TABLET | Freq: Every day | ORAL | 0 refills | Status: DC
  Filled 2023-05-19: qty 30, 30d supply, fill #0

## 2023-05-19 NOTE — Telephone Encounter (Signed)
Requested Prescriptions  Pending Prescriptions Disp Refills   pantoprazole (PROTONIX) 40 MG tablet 30 tablet 0    Sig: Take 1 tablet (40 mg total) by mouth daily.     Gastroenterology: Proton Pump Inhibitors Failed - 05/19/2023  3:54 PM      Failed - Valid encounter within last 12 months    Recent Outpatient Visits           1 year ago Elevated blood pressure reading without diagnosis of hypertension   [place] [facility] Grayce Sessions, NP   1 year ago Gastroesophageal reflux disease, unspecified whether esophagitis present   [place] [facility] Grayce Sessions, NP   2 years ago Gastroesophageal reflux disease, unspecified whether esophagitis present   [place] [facility] Grayce Sessions, NP   3 years ago Annual visit for general adult medical examination with abnormal findings   [place] [facility] Grayce Sessions, NP   6 years ago Anxiety   [facility] [place]. [facility] [place], [place], [place]

## 2023-05-19 NOTE — Telephone Encounter (Signed)
Last Fill: 10/18/22  Last OV: 04/13/22 Next OV: 05/24/23  Routing to provider for review/authorization.

## 2023-05-19 NOTE — Telephone Encounter (Signed)
Copied from CRM [id number]. Topic: Clinical - Medication Refill >> May 19, 2023 12:52 PM Carlatta H wrote: Most Recent Primary Care Visit:  Provider: Grayce Sessions  Department: [facility]  Visit Type: OFFICE VISIT  Date: 04/13/2022  Medication: pantoprazole (PROTONIX) 40 MG tablet [[id number]]  Has the patient contacted their pharmacy? Yes (Agent: If no, request that the patient contact the pharmacy for the refill. If patient does not wish to contact the pharmacy document the reason why and proceed with request.) (Agent: If yes, when and what did the pharmacy advise?)  Is this the correct pharmacy for this prescription? Yes If no, delete pharmacy and type the correct one.  This is the patient's preferred pharmacy:   [facility] [address] [place] [address] Phone: [phone] Fax: [phone]   Has the prescription been filled recently? No  Is the patient out of the medication? Yes  Has the patient been seen for an appointment in the last year OR does the patient have an upcoming appointment? Yes  Can we respond through MyChart? Yes  Agent: Please be advised that Rx refills may take up to 3 business days. We ask that you follow-up with your pharmacy.

## 2023-05-22 ENCOUNTER — Other Ambulatory Visit: Payer: Self-pay

## 2023-05-23 ENCOUNTER — Ambulatory Visit (HOSPITAL_COMMUNITY): Payer: Medicaid Other | Admitting: Licensed Clinical Social Worker

## 2023-05-23 DIAGNOSIS — F320 Major depressive disorder, single episode, mild: Secondary | ICD-10-CM

## 2023-05-23 DIAGNOSIS — F6381 Intermittent explosive disorder: Secondary | ICD-10-CM

## 2023-05-23 DIAGNOSIS — F411 Generalized anxiety disorder: Secondary | ICD-10-CM
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
## 2023-05-24 ENCOUNTER — Other Ambulatory Visit: Payer: Self-pay

## 2023-05-24 ENCOUNTER — Ambulatory Visit (INDEPENDENT_AMBULATORY_CARE_PROVIDER_SITE_OTHER): Payer: Self-pay | Admitting: Primary Care

## 2023-05-24 ENCOUNTER — Encounter (INDEPENDENT_AMBULATORY_CARE_PROVIDER_SITE_OTHER): Payer: Self-pay | Admitting: Primary Care

## 2023-05-24 VITALS — BP 144/99 | HR 86 | Resp 16 | Ht 77.0 in | Wt 329.0 lb

## 2023-05-24 DIAGNOSIS — Z2821 Immunization not carried out because of patient refusal: Secondary | ICD-10-CM

## 2023-05-24 DIAGNOSIS — R7303 Prediabetes: Secondary | ICD-10-CM

## 2023-05-24 DIAGNOSIS — R030 Elevated blood-pressure reading, without diagnosis of hypertension: Secondary | ICD-10-CM

## 2023-05-24 DIAGNOSIS — E782 Mixed hyperlipidemia: Secondary | ICD-10-CM

## 2023-05-24 MED ORDER — AMLODIPINE BESYLATE 10 MG PO TABS
10.0000 mg | ORAL_TABLET | Freq: Every day | ORAL | 1 refills | Status: DC
  Filled 2023-05-24: qty 90, 90d supply, fill #0

## 2023-05-24 NOTE — Progress Notes (Signed)
 [facility]  Preston [name], is a 43 y.o. male  RDW:[id number]  FMW:[id number]  DOB - Feb 27, 1981  Chief Complaint  Patient presents with   Hypertension       Subjective:   Preston [name] is a 43 y.o. male here today for a follow up visit HTN Patient has No headache, No chest pain, No abdominal pain - No Nausea, No new weakness tingling or numbness, No Cough - shortness of breath. Patient is able to take Bp at home and send in results via mychart. HPI  No problems updated.  Comprehensive ROS Pertinent positive and negative noted in HPI   Allergies  Allergen Reactions   Other Swelling and Other (See Comments)    Gi cocktail it makes me feel like my throat is closing up   Red Dye #40 (Allura Red) Hives and Itching   Yellow Dyes (Non-Tartrazine) Hives and Itching    Past Medical History:  Diagnosis Date   Acid reflux    Anxiety    Depression    Gastroparesis     Current Outpatient Medications on File Prior to Visit  Medication Sig Dispense Refill   cetirizine  (ZYRTEC ) 10 MG tablet Take 1 tablet (10 mg total) by mouth daily. 90 tablet 1   pantoprazole  (PROTONIX ) 40 MG tablet Take 1 tablet (40 mg total) by mouth daily. 30 tablet 0   No current facility-administered medications on file prior to visit.   Health Maintenance  Topic Date Due   COVID-19 Vaccine (1 - 2024-25 season) Never done   Flu Shot  07/17/2023*   Pneumococcal Vaccination (1 of 2 - PCV) 05/23/2024*   DTaP/Tdap/Td vaccine (2 - Td or Tdap) 11/03/2026   Hepatitis C Screening  Completed   HIV Screening  Completed   HPV Vaccine  Aged Out  *Topic was postponed. The date shown is not the original due date.    Objective:   Vitals:   05/24/23 1012 05/24/23 1013  BP: (!) 133/92 (!) 144/99  Pulse: 86   Resp: 16   SpO2: 98%   Weight: (!) 329 lb (149.2 kg)   Height: 6' 5 (1.956 m)    BP Readings from Last 3 Encounters:  05/24/23 (!) 144/99  05/10/22 (!) 147/101  04/13/22 130/89       Physical Exam Vitals reviewed.  Constitutional:      Appearance: He is obese.  HENT:     Head: Normocephalic.     Right Ear: Tympanic membrane and external ear normal.     Left Ear: Tympanic membrane and external ear normal.  Eyes:     Extraocular Movements: Extraocular movements intact.  Cardiovascular:     Rate and Rhythm: Normal rate and regular rhythm.  Pulmonary:     Effort: Pulmonary effort is normal.     Breath sounds: Normal breath sounds.  Abdominal:     General: Bowel sounds are normal. There is distension.     Palpations: Abdomen is soft.  Musculoskeletal:        General: Normal range of motion.  Skin:    General: Skin is warm.  Neurological:     Mental Status: He is oriented to person, place, and time.  Psychiatric:        Mood and Affect: Mood normal.        Behavior: Behavior normal.    Assessment & Plan  Jaki was seen today for hypertension.  Diagnoses and all orders for this visit:  Elevated blood pressure reading without diagnosis of hypertension  BP goal - < 130/80 Explained that having normal blood pressure is the goal and medications are helping to get to goal and maintain normal blood pressure. DIET: Limit salt intake, read nutrition labels to check salt content, limit fried and high fatty foods  Avoid using multisymptom OTC cold preparations that generally contain sudafed which can rise BP. Consult with pharmacist on best cold relief products to use for persons with HTN EXERCISE Discussed incorporating exercise such as walking - 30 minutes most days of the week and can do in 10 minute intervals    -     CBC with Differential/Platelet -     CMP14+EGFR -     amLODipine  (NORVASC ) 10 MG tablet; Take 1 tablet (10 mg total) by mouth daily.  Mixed hyperlipidemia -     Lipid panel  Prediabetes -     CBC with Differential/Platelet -     CMP14+EGFR -     Hemoglobin A1c  Influenza vaccination declined  Pneumococcal vaccination  declined  Patient have been counseled extensively about nutrition and exercise. Other issues discussed during this visit include: low cholesterol diet, weight control and daily exercise, foot care, annual eye examinations at Ophthalmology, importance of adherence with medications and regular follow-up. We also discussed long term complications of uncontrolled diabetes and hypertension.   Return in about 3 months (around 08/21/2023) for medical conditions.  The patient was given clear instructions to go to ER or return to medical center if symptoms don't improve, worsen or new problems develop. The patient verbalized understanding. The patient was told to call to get lab results if they haven't heard anything in the next week.   This note has been created with Education officer, environmental. Any transcriptional errors are unintentional.   Preston SHAUNNA Bohr, NP 05/24/2023, 10:38 AM

## 2023-05-25 LAB — CBC WITH DIFFERENTIAL/PLATELET
Basophils Absolute: 0 10*3/uL (ref 0.0–0.2)
Basos: 1 %
EOS (ABSOLUTE): 0.1 10*3/uL (ref 0.0–0.4)
Eos: 2 %
Hematocrit: 47.2 % (ref 37.5–51.0)
Hemoglobin: 15.7 g/dL (ref 13.0–17.7)
Immature Grans (Abs): 0 10*3/uL (ref 0.0–0.1)
Immature Granulocytes: 0 %
Lymphocytes Absolute: 1.6 10*3/uL (ref 0.7–3.1)
Lymphs: 31 %
MCH: 29.2 pg (ref 26.6–33.0)
MCHC: 33.3 g/dL (ref 31.5–35.7)
MCV: 88 fL (ref 79–97)
Monocytes Absolute: 1.1 10*3/uL — ABNORMAL HIGH (ref 0.1–0.9)
Monocytes: 22 %
Neutrophils Absolute: 2.3 10*3/uL (ref 1.4–7.0)
Neutrophils: 44 %
Platelets: 327 10*3/uL (ref 150–450)
RBC: 5.38 x10E6/uL (ref 4.14–5.80)
RDW: 13.2 % (ref 11.6–15.4)
WBC: 5.2 10*3/uL (ref 3.4–10.8)

## 2023-05-25 LAB — HEMOGLOBIN A1C
Est. average glucose Bld gHb Est-mCnc: 137 mg/dL
Hgb A1c MFr Bld: 6.4 % — ABNORMAL HIGH (ref 4.8–5.6)

## 2023-05-25 LAB — LIPID PANEL
Chol/HDL Ratio: 6.5 {ratio} — ABNORMAL HIGH (ref 0.0–5.0)
Cholesterol, Total: 258 mg/dL — ABNORMAL HIGH (ref 100–199)
HDL: 40 mg/dL (ref >39)
LDL Chol Calc (NIH): 177 mg/dL — ABNORMAL HIGH (ref 0–99)
Triglycerides: 216 mg/dL — ABNORMAL HIGH (ref 0–149)
VLDL Cholesterol Cal: 41 mg/dL — ABNORMAL HIGH (ref 5–40)

## 2023-05-25 LAB — CMP14+EGFR
ALT: 26 [IU]/L (ref 0–44)
AST: 19 [IU]/L (ref 0–40)
Albumin: 4.6 g/dL (ref 4.1–5.1)
Alkaline Phosphatase: 79 [IU]/L (ref 44–121)
BUN/Creatinine Ratio: 8 — ABNORMAL LOW (ref 9–20)
BUN: 9 mg/dL (ref 6–24)
Bilirubin Total: 0.3 mg/dL (ref 0.0–1.2)
CO2: 23 mmol/L (ref 20–29)
Calcium: 9.5 mg/dL (ref 8.7–10.2)
Chloride: 102 mmol/L (ref 96–106)
Creatinine, Ser: 1.07 mg/dL (ref 0.76–1.27)
Globulin, Total: 3 g/dL (ref 1.5–4.5)
Glucose: 106 mg/dL — ABNORMAL HIGH (ref 70–99)
Potassium: 4.7 mmol/L (ref 3.5–5.2)
Sodium: 141 mmol/L (ref 134–144)
Total Protein: 7.6 g/dL (ref 6.0–8.5)
eGFR: 89 mL/min/{1.73_m2} (ref >59)

## 2023-05-30 ENCOUNTER — Other Ambulatory Visit (INDEPENDENT_AMBULATORY_CARE_PROVIDER_SITE_OTHER): Payer: Self-pay | Admitting: Primary Care

## 2023-05-30 ENCOUNTER — Encounter (INDEPENDENT_AMBULATORY_CARE_PROVIDER_SITE_OTHER): Payer: Self-pay | Admitting: Primary Care

## 2023-05-30 ENCOUNTER — Other Ambulatory Visit: Payer: Self-pay

## 2023-05-30 DIAGNOSIS — E782 Mixed hyperlipidemia: Secondary | ICD-10-CM

## 2023-05-30 MED ORDER — ATORVASTATIN CALCIUM 80 MG PO TABS
80.0000 mg | ORAL_TABLET | Freq: Every day | ORAL | 3 refills | Status: DC
  Filled 2023-05-30: qty 90, 90d supply, fill #0

## 2023-05-31 ENCOUNTER — Other Ambulatory Visit: Payer: Self-pay

## 2023-06-02 ENCOUNTER — Other Ambulatory Visit: Payer: Self-pay

## 2023-06-09 ENCOUNTER — Other Ambulatory Visit: Payer: Self-pay

## 2023-06-12 ENCOUNTER — Other Ambulatory Visit: Payer: Self-pay

## 2023-06-12 ENCOUNTER — Other Ambulatory Visit (INDEPENDENT_AMBULATORY_CARE_PROVIDER_SITE_OTHER): Payer: Self-pay | Admitting: Primary Care

## 2023-06-12 DIAGNOSIS — K219 Gastro-esophageal reflux disease without esophagitis: Secondary | ICD-10-CM

## 2023-06-12 MED ORDER — PANTOPRAZOLE SODIUM 40 MG PO TBEC
40.0000 mg | DELAYED_RELEASE_TABLET | Freq: Every day | ORAL | 0 refills | Status: DC
  Filled 2023-06-12: qty 30, 30d supply, fill #0

## 2023-06-12 NOTE — Telephone Encounter (Signed)
 Will forward to provider

## 2023-06-13 ENCOUNTER — Other Ambulatory Visit: Payer: Self-pay

## 2023-06-14 ENCOUNTER — Ambulatory Visit (HOSPITAL_COMMUNITY): Payer: Medicaid Other | Admitting: Licensed Clinical Social Worker

## 2023-06-14 ENCOUNTER — Other Ambulatory Visit: Payer: Self-pay

## 2023-06-20 ENCOUNTER — Telehealth (INDEPENDENT_AMBULATORY_CARE_PROVIDER_SITE_OTHER): Payer: Self-pay | Admitting: Primary Care

## 2023-06-20 NOTE — Telephone Encounter (Signed)
 Left VM with pt about their upcoming apt.

## 2023-06-21 ENCOUNTER — Encounter (INDEPENDENT_AMBULATORY_CARE_PROVIDER_SITE_OTHER): Payer: Self-pay | Admitting: Primary Care

## 2023-06-21 ENCOUNTER — Ambulatory Visit (INDEPENDENT_AMBULATORY_CARE_PROVIDER_SITE_OTHER): Payer: Medicaid Other | Admitting: Primary Care

## 2023-06-21 VITALS — BP 133/86 | HR 71 | Temp 98.6°F | Resp 14 | Ht 77.0 in | Wt 331.0 lb

## 2023-06-21 DIAGNOSIS — I10 Essential (primary) hypertension: Secondary | ICD-10-CM | POA: Diagnosis not present

## 2023-06-21 NOTE — Progress Notes (Signed)
 [facility]  Preston [name], is a 43 y.o. male  BMW:[id number]  UVO:[id number]  DOB - [birth year]  Chief Complaint  Patient presents with   Follow-up       Subjective:   Preston [name] is a 43 y.o. male here today for a follow up visit hypertension/blood pressure on his medications patient has No headache, No chest pain, No abdominal pain - No Nausea, No new weakness tingling or numbness, No Cough - shortness of breath He has been compliant with his medications monitoring his sodium intake blood pressure today 133/86.  No problems updated.  Comprehensive ROS Pertinent positive and negative noted in HPI   Allergies  Allergen Reactions   Other Swelling and Other (See Comments)    Gi cocktail "it makes me feel like my throat is closing up"   Red Dye #40 (Allura Red) Hives and Itching   Yellow Dyes (Non-Tartrazine) Hives and Itching    Past Medical History:  Diagnosis Date   Acid reflux    Anxiety    Depression    Gastroparesis     Current Outpatient Medications on File Prior to Visit  Medication Sig Dispense Refill   amLODipine (NORVASC) 10 MG tablet Take 1 tablet (10 mg total) by mouth daily. 90 each 1   atorvastatin (LIPITOR) 80 MG tablet Take 1 tablet (80 mg total) by mouth daily. 90 tablet 3   cetirizine (ZYRTEC) 10 MG tablet Take 1 tablet (10 mg total) by mouth daily. 90 tablet 1   pantoprazole (PROTONIX) 40 MG tablet Take 1 tablet (40 mg total) by mouth daily. 30 tablet 0   No current facility-administered medications on file prior to visit.   Health Maintenance  Topic Date Due   COVID-19 Vaccine (1 - 2024-25 season) 07/07/2023*   Flu Shot  07/17/2023*   Pneumococcal Vaccination (1 of 2 - PCV) 05/23/2024*   DTaP/Tdap/Td vaccine (2 - Td or Tdap) 11/03/2026   Hepatitis C Screening  Completed   HIV Screening  Completed   HPV Vaccine  Aged Out  *Topic was postponed. The date shown is not the original due date.    Objective:   Vitals:    06/21/23 1122  BP: 133/86  Pulse: 71  Resp: 14  Temp: 98.6 F (37 C)  TempSrc: Oral  SpO2: 99%  Weight: (!) 331 lb (150.1 kg)  Height: 6\' 5"  (1.956 m)   BP Readings from Last 3 Encounters:  06/21/23 133/86  05/24/23 (!) 144/99  05/10/22 (!) 147/101      Physical Exam Vitals reviewed.  Constitutional:      Appearance: He is obese.  HENT:     Head: Normocephalic.     Right Ear: Tympanic membrane and external ear normal.     Left Ear: Tympanic membrane and external ear normal.     Nose: Nose normal.  Eyes:     Extraocular Movements: Extraocular movements intact.     Pupils: Pupils are equal, round, and reactive to light.  Cardiovascular:     Rate and Rhythm: Normal rate and regular rhythm.  Pulmonary:     Effort: Pulmonary effort is normal.     Breath sounds: Normal breath sounds.  Abdominal:     General: Bowel sounds are normal. There is distension.     Palpations: Abdomen is soft.  Musculoskeletal:        General: Normal range of motion.  Skin:    General: Skin is warm and dry.  Neurological:     Mental Status:  He is oriented to person, place, and time.  Psychiatric:        Mood and Affect: Mood normal.        Behavior: Behavior normal.        Thought Content: Thought content normal.        Judgment: Judgment normal.     Assessment & Plan  Preston [name] was seen today for follow-up.  Diagnoses and all orders for this visit:  Essential hypertension BP check on medication please with results BP goal - < 130/80 Explained that having normal blood pressure is the goal and medications are helping to get to goal and maintain normal blood pressure. DIET: Limit salt intake, read nutrition labels to check salt content, limit fried and high fatty foods  Avoid using multisymptom OTC cold preparations that generally contain sudafed which can rise BP. Consult with pharmacist on best cold relief products to use for persons with HTN EXERCISE Discussed incorporating exercise  such as walking - 30 minutes most days of the week and can do in 10 minute intervals        Patient have been counseled extensively about nutrition and exercise. Other issues discussed during this visit include: low cholesterol diet, weight control and daily exercise, foot care, annual eye examinations at Ophthalmology, importance of adherence with medications and regular follow-up. We also discussed long term complications of uncontrolled diabetes and hypertension.   Return in about 3 months (around 09/21/2023).  The patient was given clear instructions to go to ER or return to medical center if symptoms don't improve, worsen or new problems develop. The patient verbalized understanding. The patient was told to call to get lab results if they haven't heard anything in the next week.   This note has been created with Education officer, environmental. Any transcriptional errors are unintentional.   Grayce Sessions, NP 06/25/2023, 4:28 PM

## 2023-07-05 ENCOUNTER — Ambulatory Visit (INDEPENDENT_AMBULATORY_CARE_PROVIDER_SITE_OTHER): Payer: Medicaid Other | Admitting: Licensed Clinical Social Worker

## 2023-07-05 DIAGNOSIS — F6381 Intermittent explosive disorder: Secondary | ICD-10-CM | POA: Diagnosis not present

## 2023-07-05 DIAGNOSIS — F411 Generalized anxiety disorder: Secondary | ICD-10-CM
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
## 2023-07-06 ENCOUNTER — Ambulatory Visit (INDEPENDENT_AMBULATORY_CARE_PROVIDER_SITE_OTHER)

## 2023-07-14 ENCOUNTER — Other Ambulatory Visit: Payer: Self-pay

## 2023-07-21 ENCOUNTER — Other Ambulatory Visit (INDEPENDENT_AMBULATORY_CARE_PROVIDER_SITE_OTHER): Payer: Self-pay | Admitting: Primary Care

## 2023-07-21 ENCOUNTER — Other Ambulatory Visit: Payer: Self-pay

## 2023-07-21 DIAGNOSIS — K219 Gastro-esophageal reflux disease without esophagitis: Secondary | ICD-10-CM

## 2023-07-21 MED ORDER — PANTOPRAZOLE SODIUM 40 MG PO TBEC
40.0000 mg | DELAYED_RELEASE_TABLET | Freq: Every day | ORAL | 0 refills | Status: DC
  Filled 2023-07-21: qty 30, 30d supply, fill #0

## 2023-07-24 ENCOUNTER — Other Ambulatory Visit: Payer: Self-pay

## 2023-08-01 ENCOUNTER — Ambulatory Visit (HOSPITAL_COMMUNITY): Payer: Medicaid Other | Admitting: Licensed Clinical Social Worker

## 2023-08-02 ENCOUNTER — Encounter (HOSPITAL_COMMUNITY): Payer: Self-pay

## 2023-08-02 ENCOUNTER — Ambulatory Visit (HOSPITAL_COMMUNITY): Admitting: Licensed Clinical Social Worker

## 2023-08-16 ENCOUNTER — Other Ambulatory Visit: Payer: Self-pay

## 2023-08-16 ENCOUNTER — Other Ambulatory Visit (INDEPENDENT_AMBULATORY_CARE_PROVIDER_SITE_OTHER): Payer: Self-pay | Admitting: Primary Care

## 2023-08-16 DIAGNOSIS — K219 Gastro-esophageal reflux disease without esophagitis: Secondary | ICD-10-CM

## 2023-08-16 NOTE — Telephone Encounter (Signed)
 Will forward to provider

## 2023-08-17 ENCOUNTER — Other Ambulatory Visit: Payer: Self-pay

## 2023-08-22 ENCOUNTER — Ambulatory Visit (INDEPENDENT_AMBULATORY_CARE_PROVIDER_SITE_OTHER): Admitting: Licensed Clinical Social Worker

## 2023-08-22 DIAGNOSIS — F411 Generalized anxiety disorder: Secondary | ICD-10-CM

## 2023-08-22 DIAGNOSIS — F6381 Intermittent explosive disorder: Secondary | ICD-10-CM | POA: Diagnosis not present

## 2023-08-22 DIAGNOSIS — F320 Major depressive disorder, single episode, mild: Secondary | ICD-10-CM
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
## 2023-09-13 ENCOUNTER — Other Ambulatory Visit (INDEPENDENT_AMBULATORY_CARE_PROVIDER_SITE_OTHER): Payer: Self-pay | Admitting: Primary Care

## 2023-09-13 DIAGNOSIS — K219 Gastro-esophageal reflux disease without esophagitis: Secondary | ICD-10-CM

## 2023-09-13 NOTE — Telephone Encounter (Signed)
 Will forward to provider

## 2023-09-14 MED ORDER — CETIRIZINE HCL 10 MG PO TABS
10.0000 mg | ORAL_TABLET | Freq: Every day | ORAL | 1 refills | Status: DC
  Filled 2023-09-14: qty 30, 30d supply, fill #0
  Filled 2023-10-08: qty 30, 30d supply, fill #1
  Filled 2023-11-10: qty 30, 30d supply, fill #2
  Filled 2023-12-19: qty 30, 30d supply, fill #3
  Filled 2024-01-24: qty 30, 30d supply, fill #4
  Filled 2024-02-26: qty 30, 30d supply, fill #5

## 2023-09-14 MED ORDER — PANTOPRAZOLE SODIUM 40 MG PO TBEC
40.0000 mg | DELAYED_RELEASE_TABLET | Freq: Every day | ORAL | 0 refills | Status: DC
  Filled 2023-09-14: qty 30, 30d supply, fill #0

## 2023-09-15 ENCOUNTER — Other Ambulatory Visit: Payer: Self-pay

## 2023-09-25 ENCOUNTER — Ambulatory Visit (INDEPENDENT_AMBULATORY_CARE_PROVIDER_SITE_OTHER): Admitting: Primary Care

## 2023-09-26 ENCOUNTER — Ambulatory Visit (INDEPENDENT_AMBULATORY_CARE_PROVIDER_SITE_OTHER): Admitting: Licensed Clinical Social Worker

## 2023-09-26 ENCOUNTER — Telehealth (INDEPENDENT_AMBULATORY_CARE_PROVIDER_SITE_OTHER): Payer: Self-pay | Admitting: Primary Care

## 2023-09-26 DIAGNOSIS — F6381 Intermittent explosive disorder: Secondary | ICD-10-CM | POA: Diagnosis not present

## 2023-09-26 DIAGNOSIS — F320 Major depressive disorder, single episode, mild: Secondary | ICD-10-CM

## 2023-09-26 DIAGNOSIS — F411 Generalized anxiety disorder: Secondary | ICD-10-CM

## 2023-09-26 NOTE — Telephone Encounter (Signed)
 Called pt to confirm appt LVM
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
## 2023-09-27 ENCOUNTER — Encounter (INDEPENDENT_AMBULATORY_CARE_PROVIDER_SITE_OTHER): Payer: Self-pay | Admitting: Primary Care

## 2023-09-27 ENCOUNTER — Ambulatory Visit (INDEPENDENT_AMBULATORY_CARE_PROVIDER_SITE_OTHER): Admitting: Primary Care

## 2023-09-27 VITALS — BP 132/88 | HR 76 | Resp 16 | Wt 328.2 lb

## 2023-09-27 DIAGNOSIS — E782 Mixed hyperlipidemia: Secondary | ICD-10-CM

## 2023-09-27 DIAGNOSIS — R7303 Prediabetes: Secondary | ICD-10-CM | POA: Diagnosis not present

## 2023-09-27 DIAGNOSIS — I10 Essential (primary) hypertension: Secondary | ICD-10-CM | POA: Diagnosis not present

## 2023-09-27 NOTE — Progress Notes (Signed)
 [facility]  Preston [name], is a 43 y.o. male  ZOX:[id number]  [id number]  DOB - 01/14/1981  Chief Complaint  Patient presents with   Hypertension   Hyperlipidemia   Prediabetes       Subjective:   Preston [name] is a 43 y.o. male here today for a follow up visit for the management of hypertension.  He is not taking his blood pressure medication trying to maintain it naturally.  Explained that uncontrolled blood pressure can lead to a stroke and heart attack which is greater in African-American obese males.  Patient voices understanding.  Patient has No headache, No chest pain, No abdominal pain - No Nausea, No new weakness tingling or numbness, No Cough - shortness of breath.  Blood pressure has improved with exercising on a regular basis and monitoring salt intake and food intake.   No problems updated.  Comprehensive ROS Pertinent positive and negative noted in HPI   Allergies  Allergen Reactions   Other Swelling and Other (See Comments)    Gi cocktail it makes me feel like my throat is closing up   Red Dye #40 (Allura Red) Hives and Itching   Yellow Dyes (Non-Tartrazine) Hives and Itching    Past Medical History:  Diagnosis Date   Acid reflux    Anxiety    Depression    Gastroparesis     Current Outpatient Medications on File Prior to Visit  Medication Sig Dispense Refill   amLODipine  (NORVASC ) 10 MG tablet Take 1 tablet (10 mg total) by mouth daily. 90 each 1   atorvastatin  (LIPITOR) 80 MG tablet Take 1 tablet (80 mg total) by mouth daily. 90 tablet 3   cetirizine  (ZYRTEC ) 10 MG tablet Take 1 tablet (10 mg total) by mouth daily. 90 tablet 1   pantoprazole  (PROTONIX ) 40 MG tablet Take 1 tablet (40 mg total) by mouth daily.Courtesy refill given, please keep appointment to receive additional refills. 30 tablet 0   No current facility-administered medications on file prior to visit.   Health Maintenance  Topic Date Due   COVID-19 Vaccine (1 -  2024-25 season) Never done   Pneumococcal Vaccination (1 of 2 - PCV) 05/23/2024*   Flu Shot  11/17/2023   DTaP/Tdap/Td vaccine (2 - Td or Tdap) 11/03/2026   Hepatitis C Screening  Completed   HIV Screening  Completed   HPV Vaccine  Aged Out   Meningitis B Vaccine  Aged Out  *Topic was postponed. The date shown is not the original due date.    Objective:   Vitals:   09/27/23 1144  BP: 132/88  Pulse: 76  Resp: 16  SpO2: 96%  Weight: (!) 328 lb 3.2 oz (148.9 kg)   BP Readings from Last 3 Encounters:  09/27/23 132/88  06/21/23 133/86  05/24/23 (!) 144/99      Physical Exam Vitals reviewed.  Constitutional:      Appearance: He is obese.  HENT:     Head: Normocephalic.     Right Ear: Tympanic membrane and external ear normal.     Left Ear: Tympanic membrane and external ear normal.     Nose: Nose normal.  Eyes:     Extraocular Movements: Extraocular movements intact.     Pupils: Pupils are equal, round, and reactive to light.  Cardiovascular:     Rate and Rhythm: Normal rate and regular rhythm.  Pulmonary:     Effort: Pulmonary effort is normal.     Breath sounds: Normal breath sounds.  Abdominal:     General: Bowel sounds are normal. There is distension.     Palpations: Abdomen is soft.  Musculoskeletal:        General: Normal range of motion.  Skin:    General: Skin is warm and dry.  Neurological:     Mental Status: He is oriented to person, place, and time.  Psychiatric:        Mood and Affect: Mood normal.        Behavior: Behavior normal.        Thought Content: Thought content normal.        Judgment: Judgment normal.       Assessment & Plan  Preston [name] was seen today for hypertension, hyperlipidemia and prediabetes.  Diagnoses and all orders for this visit:  Essential hypertension BP goal - < 130/80 Explained that having normal blood pressure is the goal and medications are helping to get to goal and maintain normal blood pressure. DIET: Limit salt  intake, read nutrition labels to check salt content, limit fried and high fatty foods  Avoid using multisymptom OTC cold preparations that generally contain sudafed which can rise BP. Consult with pharmacist on best cold relief products to use for persons with HTN EXERCISE Discussed incorporating exercise such as walking - 30 minutes most days of the week and can do in 10 minute intervals    -     CBC with Differential/Platelet -     CMP14+EGFR  Mixed hyperlipidemia  Healthy lifestyle diet of fruits vegetables fish nuts whole grains and low saturated fat . Foods high in cholesterol or liver, fatty meats,cheese, butter avocados, nuts and seeds, chocolate and fried foods. -     Lipid panel    Prediabetes - educated on lifestyle modifications, including but not limited to diet choices and adding exercise to daily routine.   -     Hemoglobin A1c 6.0 down from 6.4 ( 4 months ago)      The patient was given clear instructions to go to ER or return to medical center if symptoms don't improve, worsen or new problems develop. The patient verbalized understanding. The patient was told to call to get lab results if they haven't heard anything in the next week.   This note has been created with Education officer, environmental. Any transcriptional errors are unintentional.   Marius Siemens, NP 09/27/2023, 12:26 PM

## 2023-09-28 LAB — LIPID PANEL
Chol/HDL Ratio: 5.9 ratio — ABNORMAL HIGH (ref 0.0–5.0)
Cholesterol, Total: 267 mg/dL — ABNORMAL HIGH (ref 100–199)
HDL: 45 mg/dL (ref >39)
LDL Chol Calc (NIH): 193 mg/dL — ABNORMAL HIGH (ref 0–99)
Triglycerides: 154 mg/dL — ABNORMAL HIGH (ref 0–149)
VLDL Cholesterol Cal: 29 mg/dL (ref 5–40)

## 2023-09-28 LAB — CMP14+EGFR
ALT: 24 IU/L (ref 0–44)
AST: 17 IU/L (ref 0–40)
Albumin: 4.4 g/dL (ref 4.1–5.1)
Alkaline Phosphatase: 84 IU/L (ref 44–121)
BUN/Creatinine Ratio: 11 (ref 9–20)
BUN: 12 mg/dL (ref 6–24)
Bilirubin Total: 0.2 mg/dL (ref 0.0–1.2)
CO2: 24 mmol/L (ref 20–29)
Calcium: 9.5 mg/dL (ref 8.7–10.2)
Chloride: 102 mmol/L (ref 96–106)
Creatinine, Ser: 1.08 mg/dL (ref 0.76–1.27)
Globulin, Total: 2.7 g/dL (ref 1.5–4.5)
Glucose: 94 mg/dL (ref 70–99)
Potassium: 4.8 mmol/L (ref 3.5–5.2)
Sodium: 140 mmol/L (ref 134–144)
Total Protein: 7.1 g/dL (ref 6.0–8.5)
eGFR: 88 mL/min/{1.73_m2} (ref >59)

## 2023-09-28 LAB — CBC WITH DIFFERENTIAL/PLATELET
Basophils Absolute: 0.1 10*3/uL (ref 0.0–0.2)
Basos: 1 %
EOS (ABSOLUTE): 0.2 10*3/uL (ref 0.0–0.4)
Eos: 3 %
Hematocrit: 46.2 % (ref 37.5–51.0)
Hemoglobin: 15.3 g/dL (ref 13.0–17.7)
Immature Grans (Abs): 0 10*3/uL (ref 0.0–0.1)
Immature Granulocytes: 0 %
Lymphocytes Absolute: 2.3 10*3/uL (ref 0.7–3.1)
Lymphs: 27 %
MCH: 29.8 pg (ref 26.6–33.0)
MCHC: 33.1 g/dL (ref 31.5–35.7)
MCV: 90 fL (ref 79–97)
Monocytes Absolute: 0.6 10*3/uL (ref 0.1–0.9)
Monocytes: 7 %
Neutrophils Absolute: 5.5 10*3/uL (ref 1.4–7.0)
Neutrophils: 62 %
Platelets: 341 10*3/uL (ref 150–450)
RBC: 5.14 x10E6/uL (ref 4.14–5.80)
RDW: 13.4 % (ref 11.6–15.4)
WBC: 8.8 10*3/uL (ref 3.4–10.8)

## 2023-09-28 LAB — HEMOGLOBIN A1C
Est. average glucose Bld gHb Est-mCnc: 126 mg/dL
Hgb A1c MFr Bld: 6 % — ABNORMAL HIGH (ref 4.8–5.6)

## 2023-09-29 ENCOUNTER — Ambulatory Visit (INDEPENDENT_AMBULATORY_CARE_PROVIDER_SITE_OTHER): Payer: Self-pay | Admitting: Primary Care

## 2023-10-08 ENCOUNTER — Other Ambulatory Visit (INDEPENDENT_AMBULATORY_CARE_PROVIDER_SITE_OTHER): Payer: Self-pay | Admitting: Primary Care

## 2023-10-08 DIAGNOSIS — K219 Gastro-esophageal reflux disease without esophagitis: Secondary | ICD-10-CM

## 2023-10-09 ENCOUNTER — Other Ambulatory Visit: Payer: Self-pay

## 2023-10-09 ENCOUNTER — Other Ambulatory Visit (INDEPENDENT_AMBULATORY_CARE_PROVIDER_SITE_OTHER): Payer: Self-pay

## 2023-10-09 DIAGNOSIS — K219 Gastro-esophageal reflux disease without esophagitis: Secondary | ICD-10-CM

## 2023-10-09 MED ORDER — PANTOPRAZOLE SODIUM 40 MG PO TBEC
40.0000 mg | DELAYED_RELEASE_TABLET | Freq: Every day | ORAL | 1 refills | Status: DC
  Filled 2023-10-09: qty 90, 90d supply, fill #0
  Filled 2023-11-18 – 2024-01-24 (×3): qty 90, 90d supply, fill #1

## 2023-10-10 ENCOUNTER — Other Ambulatory Visit: Payer: Self-pay

## 2023-10-16 ENCOUNTER — Other Ambulatory Visit: Payer: Self-pay

## 2023-10-24 ENCOUNTER — Ambulatory Visit (HOSPITAL_COMMUNITY): Admitting: Licensed Clinical Social Worker

## 2023-11-15 ENCOUNTER — Ambulatory Visit (INDEPENDENT_AMBULATORY_CARE_PROVIDER_SITE_OTHER): Admitting: Licensed Clinical Social Worker

## 2023-11-15 DIAGNOSIS — F6381 Intermittent explosive disorder: Secondary | ICD-10-CM | POA: Diagnosis not present

## 2023-11-15 DIAGNOSIS — F411 Generalized anxiety disorder: Secondary | ICD-10-CM

## 2023-11-15 DIAGNOSIS — F320 Major depressive disorder, single episode, mild: Secondary | ICD-10-CM

## 2023-11-15 NOTE — Progress Notes (Signed)
 THERAPIST PROGRESS NOTE  Session Time: 66  Participation Level: Active  Behavioral Response: CasualAlertAnxious and Depressed  Type of Therapy: Individual Therapy  Treatment Goals addressed:  Active     Anger Management Problem  1 IDD      Decrease PHQ-9 below 5  (Completed/Met)     Start:  03/02/21    Expected End:  09/15/21    Resolved:  06/22/21      Decrease GAD-7 below 5  (Completed/Met)     Start:  03/02/21    Expected End:  06/16/22    Resolved:  03/16/22      Attend 1 mental health support group meeting monthly for 6 months.  (Progressing)     Start:  03/02/21    Expected End:  04/19/24         STG: Devario will identify situations, thoughts, and feelings that trigger internal anger, angry verbal and/or aggressive behavioral actions as evidenced by a self-recorded report. (Completed/Met)     Start:  03/02/21    Expected End:  09/15/21    Resolved:  06/22/21       identify three triggers for anxiety and depression (Progressing)     Start:  12/08/21    Expected End:  04/19/24         EDUCATE Penne ON ANGER MANAGEMENT SKILLS AND THE RATIONALE FOR LEARNING THESE SKILLS (Completed)     Start:  03/02/21    End:  11/10/21      EDUCATE Ameer ON ANGER PAYOFFS (INTERNAL OR EXTERNAL REINFORCERS OF ANGER RESPONSE) AND THE RATIONALE FOR IDENTIFYING REINFORCERS OF ANGRY RESPONSES (Completed)     Start:  03/02/21    End:  11/10/21      EDUCATE Denard ON RELAXATION TECHNIQUES AND THE RATIONALE FOR LEARNING THESE TECHNIQUES (Completed)     Start:  03/02/21    End:  11/10/21      ENCOURAGE Lukus TO PRACTICE RELAXATION SKILLS AT HOME FOR 10 MINUTES, 2 TIMES PER DAY, FOR 7 DAYS (Completed)     Start:  03/02/21    End:  11/10/21      WORK WITH Abrahan TO IDENTIFY AT LEAST 1 TRIGGER THOUGHTS (THOUGHTS THAT TRIGGER AN ANGER RESPONSE) (Completed)     Start:  03/02/21    End:  11/10/21      EDUCATE Latrelle ON THE 6 CATEGORIES OF COGNITIVE DISTORTION AND  HOW THEY PROMOTE ANGER (Completed)     Start:  03/02/21    End:  03/16/22      WORK WITH Penne TO DEVELOP A COPING PLAN (Completed)     Start:  03/02/21    End:  03/16/22        Anxiety     STG: Penne will complete at least 80% of assigned homework  (Progressing)     Start:  04/26/22    Expected End:  04/19/24         STG: Penne will practice problem solving skills 3 times per week for the next 4 weeks.  (Progressing)     Start:  04/26/22    Expected End:  04/19/24         add three coping skills for anxiety  (Progressing)     Start:  04/26/22    Expected End:  04/19/24         what are three trigger for anxiety.  (Not Applicable)     Start:  04/26/22    Expected End:  09/15/23    Resolved:  07/05/23  Goal Note     Duplicate goal          Discuss risks and benefits of medication treatment options for this problem and prescribe as indicated (Completed)     Start:  04/26/22    End:  05/25/22      Encourage Penne to take psychotropic medication(s) as prescribed (Completed)     Start:  04/26/22    End:  06/15/22      Review results of GAD-7 with Penne to track progress (Completed)     Start:  04/26/22    End:  06/15/22      Work with Penne to track symptoms, triggers, and/or skill use through a mood chart, diary card, or journal (Completed)     Start:  04/26/22    End:  05/25/22      Perform psychoeducation regarding anxiety disorders (Completed)     Start:  04/26/22    End:  05/25/22      Provide Penne with educational information and reading material on anxiety, its causes, and symptoms.  (Completed)     Start:  04/26/22    End:  06/15/22         ProgressTowards Goals: Progressing  Interventions: CBT, Motivational Interviewing, and Supportive    Suicidal/Homicidal: Nowithout intent/plan  Therapist Response:   Ryun was alert and oriented x 5.  He was pleasant, cooperative, maintained good eye contact.  He engaged well in  therapy session and was dressed casually.  Patient presented with euthymic mood\affect.  Patient reports that everything has been going well.  He reports that he is moved out of his parent's house and into a friend's home.  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  Diquan states that he has been focusing on his physical and mental health as evidenced by working out and losing 20 pounds along with decreasing tobacco use for black and milds.    Traven also has been working on his anger management classes and passed his anger management test with a 90%.  Patient reports limited contact with his ex-girlfriend.  This has been a trigger for his anger in the past and has resulted in multiple legal issues.  Patient reports future goals as independent housing by November 1.  Intervention/plan: LCSW educated patient on the use of anger logs to monitor and track anger outbursts.  LCSW educated patient on the benefits of working out.  LCSW educated on the use of proper diet, physical exercise, and adequate water intake.  LCSW validated feelings in session.  LCSW utilized psychoanalytic therapy for patient to express thoughts, feelings and emotions and nonjudgmental environment.  LCSW utilize person Center therapy for empowerment.   Plan: Return again in 8 weeks.  Diagnosis: Intermittent explosive disorder in adult  GAD (generalized anxiety disorder)  Mild major depression, single episode (HCC)  Collaboration of Care: Other Continued individual therapy   Patient/Guardian was advised Release of Information must be obtained prior to any record release in order to collaborate their care with an outside provider. Patient/Guardian was advised if they have not already done so to contact the registration department to sign all necessary forms in order for us  to release information  regarding their care.   Consent: Patient/Guardian gives verbal consent for treatment and assignment of benefits for services provided during this visit. Patient/Guardian expressed understanding and agreed to proceed.   Juliene RAMAN  Dois Juarbe, LCSW 11/15/2023

## 2023-11-16 ENCOUNTER — Other Ambulatory Visit: Payer: Self-pay

## 2023-11-18 ENCOUNTER — Other Ambulatory Visit: Payer: Self-pay

## 2023-11-28 ENCOUNTER — Telehealth (INDEPENDENT_AMBULATORY_CARE_PROVIDER_SITE_OTHER): Payer: Self-pay | Admitting: Primary Care

## 2023-11-28 NOTE — Telephone Encounter (Signed)
 Called pt to confirm appt. Pt did not answer and LVM

## 2023-11-29 ENCOUNTER — Encounter (INDEPENDENT_AMBULATORY_CARE_PROVIDER_SITE_OTHER): Payer: Self-pay | Admitting: Primary Care

## 2023-11-29 ENCOUNTER — Ambulatory Visit (INDEPENDENT_AMBULATORY_CARE_PROVIDER_SITE_OTHER): Admitting: Primary Care

## 2023-11-29 VITALS — BP 124/82 | HR 71 | Resp 20 | Ht 77.0 in | Wt 324.5 lb

## 2023-11-29 DIAGNOSIS — Z0130 Encounter for examination of blood pressure without abnormal findings: Secondary | ICD-10-CM | POA: Diagnosis not present

## 2023-11-29 NOTE — Progress Notes (Signed)
 [facility]   Preston [name] is a 43 y.o. male presents for hypertension evaluation, Denies shortness of breath, headaches, chest pain or lower extremity edema, sudden onset, vision changes, unilateral weakness, dizziness, paresthesias.  Previous visit explained to patient that importance of maintaining a normal blood pressure.  Patient and PCP agree that he would not be placed on medication until follow-up workup to reevaluate blood pressure.  Since then he has been exercising, making better food choices, eating less takeout and fast foods.  Last visit we read labels to determine the amount of sodium.  He is reading labels for sodium content and mostly eliminating processed foods.  Blood pressure is unremarkable at 124/82.  Lifestyle modification work he will continue this process and we will continue following up.  Past Medical History:  Diagnosis Date   Acid reflux    Anxiety    Depression    Gastroparesis    Past Surgical History:  Procedure Laterality Date   ESOPHAGOGASTRODUODENOSCOPY     no past surgeries     Allergies  Allergen Reactions   Other Swelling and Other (See Comments)    Gi cocktail it makes me feel like my throat is closing up   Red Dye #40 (Allura Red) Hives and Itching   Yellow Dyes (Non-Tartrazine) Hives and Itching   Current Outpatient Medications on File Prior to Visit  Medication Sig Dispense Refill   cetirizine  (ZYRTEC ) 10 MG tablet Take 1 tablet (10 mg total) by mouth daily. 90 tablet 1   pantoprazole  (PROTONIX ) 40 MG tablet Take 1 tablet (40 mg total) by mouth daily.Courtesy refill given, please keep appointment to receive additional refills. 90 tablet 1   No current facility-administered medications on file prior to visit.   Social History   Socioeconomic History   Marital status: Single    Spouse name: Not on file   Number of children: Not on file   Years of education: Not on file   Highest education level: Some college, no  degree  Occupational History   Not on file  Tobacco Use   Smoking status: Some Days    Types: Cigars   Smokeless tobacco: Never   Tobacco comments:    2 black and milds daily.   Vaping Use   Vaping status: Never Used  Substance and Sexual Activity   Alcohol use: Yes    Alcohol/week: 3.0 standard drinks of alcohol    Types: 3 Standard drinks or equivalent per week    Comment: occasional use   Drug use: Yes    Types: Marijuana   Sexual activity: Yes    Partners: Female    Birth control/protection: Condom  Other Topics Concern   Not on file  Social History Narrative   Not on file   Social Drivers of Health   Financial Resource Strain: Medium Risk (11/26/2023)   Overall Financial Resource Strain (CARDIA)    Difficulty of Paying Living Expenses: Somewhat hard  Food Insecurity: Food Insecurity Present (11/26/2023)   Hunger Vital Sign    Worried About Running Out of Food in the Last Year: Sometimes true    Ran Out of Food in the Last Year: Sometimes true  Transportation Needs: No Transportation Needs (11/26/2023)   PRAPARE - Administrator, Civil Service (Medical): No    Lack of Transportation (Non-Medical): No  Physical Activity: Sufficiently Active (11/26/2023)   Exercise Vital Sign    Days of Exercise per Week: 4 days    Minutes of  Exercise per Session: 60 min  Stress: Stress Concern Present (11/26/2023)   Preston [name] [employer] - Occupational Stress Questionnaire    Feeling of Stress: To some extent  Social Connections: Moderately Integrated (11/26/2023)   Social Connection and Isolation Panel    Frequency of Communication with Friends and Family: Three times a week    Frequency of Social Gatherings with Friends and Family: Once a week    Attends Religious Services: 1 to 4 times per year    Active Member of Clubs or Organizations: Yes    Attends Banker Meetings: More than 4 times per year    Marital Status: Separated  Intimate  Partner Violence: Not At Risk (05/24/2023)   Humiliation, Afraid, Rape, and Kick questionnaire    Fear of Current or Ex-Partner: No    Emotionally Abused: No    Physically Abused: No    Sexually Abused: No   Family History  Problem Relation Age of Onset   Cancer Father    Diabetes Father    Heart attack Other    Health Maintenance  Topic Date Due   COVID-19 Vaccine (1 - 2024-25 season) Never done   INFLUENZA VACCINE  11/17/2023   Pneumococcal Vaccine: 19-49 Years (1 of 2 - PCV) 05/23/2024 (Originally 08/02/2000)   Hepatitis B Vaccines (1 of 3 - 19+ 3-dose series) 11/28/2024 (Originally 08/02/2000)   HPV VACCINES (1 - 3-dose SCDM series) 11/28/2024 (Originally 08/02/2008)   DTaP/Tdap/Td (2 - Td or Tdap) 11/03/2026   Hepatitis C Screening  Completed   HIV Screening  Completed   Meningococcal B Vaccine  Aged Out     OBJECTIVE:  Vitals:   11/29/23 1133  BP: 124/82  Pulse: 71  Resp: 20  SpO2: 100%  Weight: (!) 324 lb 8 oz (147.2 kg)  Height: 6' 5 (1.956 m)    Physical Exam Vitals reviewed.  Constitutional:      Appearance: He is obese.  HENT:     Head: Normocephalic.     Right Ear: Tympanic membrane and external ear normal.     Left Ear: Tympanic membrane and external ear normal.     Nose: Nose normal.  Eyes:     Extraocular Movements: Extraocular movements intact.     Pupils: Pupils are equal, round, and reactive to light.  Cardiovascular:     Rate and Rhythm: Normal rate and regular rhythm.  Pulmonary:     Effort: Pulmonary effort is normal.     Breath sounds: Normal breath sounds.  Abdominal:     General: Bowel sounds are normal. There is distension.     Palpations: Abdomen is soft.  Musculoskeletal:        General: Normal range of motion.  Skin:    General: Skin is warm and dry.  Neurological:     Mental Status: He is oriented to person, place, and time.  Psychiatric:        Mood and Affect: Mood normal.        Behavior: Behavior normal.        Thought  Content: Thought content normal.        Judgment: Judgment normal.      ROS  Last 3 Office BP readings: BP Readings from Last 3 Encounters:  11/29/23 124/82  09/27/23 132/88  06/21/23 133/86    BMET    Component Value Date/Time   NA 140 09/27/2023 1144   K 4.8 09/27/2023 1144   CL 102 09/27/2023 1144   CO2  24 09/27/2023 1144   GLUCOSE 94 09/27/2023 1144   GLUCOSE 96 10/02/2015 0954   BUN 12 09/27/2023 1144   CREATININE 1.08 09/27/2023 1144   CREATININE 1.18 10/02/2015 0954   CALCIUM 9.5 09/27/2023 1144   GFRNONAA 80 10/02/2015 0954   GFRAA >89 10/02/2015 0954    Renal function: CrCl cannot be calculated (Patient's most recent lab result is older than the maximum 21 days allowed.).  Clinical ASCVD: No  The 10-year ASCVD risk score (Arnett DK, et al., 2019) is: 6.3%   Values used to calculate the score:     Age: 13 years     Clincally relevant sex: Male     Is Non-Hispanic African American: Yes     Diabetic: No     Tobacco smoker: Yes     Systolic Blood Pressure: 124 mmHg     Is BP treated: No     HDL Cholesterol: 45 mg/dL     Total Cholesterol: 267 mg/dL  ASCVD risk factors include- Preston [name]   ASSESSMENT & PLAN: Preston [name] was seen today for medical management of chronic issues.  Diagnoses and all orders for this visit:  BP check See HPI   Minimize salt intake. Minimize alcohol intake    This note has been created with Education officer, environmental. Any transcriptional errors are unintentional.   Rosaline SHAUNNA Bohr, NP 11/29/2023, 12:14 PM

## 2023-12-19 ENCOUNTER — Other Ambulatory Visit: Payer: Self-pay

## 2023-12-19 ENCOUNTER — Ambulatory Visit (HOSPITAL_COMMUNITY)
Admission: EM | Admit: 2023-12-19 | Discharge: 2023-12-19 | Disposition: A | Discharge status: Home / Self Care | Attending: Emergency Medicine | Admitting: Emergency Medicine

## 2023-12-19 ENCOUNTER — Encounter (HOSPITAL_COMMUNITY): Payer: Self-pay | Admitting: *Deleted

## 2023-12-19 DIAGNOSIS — Z113 Encounter for screening for infections with a predominantly sexual mode of transmission: Secondary | ICD-10-CM | POA: Diagnosis present

## 2023-12-19 LAB — HIV ANTIBODY (ROUTINE TESTING W REFLEX): HIV Screen 4th Generation wRfx: NONREACTIVE

## 2023-12-19 NOTE — ED Triage Notes (Signed)
 Pt states he had a day off work so he decided to get STI testing. He denies sx, would like cyto and blood work.

## 2023-12-19 NOTE — ED Provider Notes (Signed)
 [facility]    CSN: [id number] Arrival date & time: 12/19/23  1305      History   Chief Complaint Chief Complaint  Patient presents with   SEXUALLY TRANSMITTED DISEASE    HPI Preston [name] is a 43 y.o. male.   Patient presents requesting routine STD testing.  Patient denies any abnormal penile discharge, genital lesions, penile/testicular pain or swelling, dysuria, hematuria, urinary frequency/urgency, abdominal pain, or fever.  Patient denies any known exposures to STDs.  The history is provided by the patient and medical records.    Past Medical History:  Diagnosis Date   Acid reflux    Anxiety    Depression    Gastroparesis     Patient Active Problem List   Diagnosis Date Noted   Intermittent explosive disorder in adult 03/02/2021   Mild major depression, single episode (HCC) 03/02/2021   GAD (generalized anxiety disorder) 03/02/2021   Obesity 09/30/2015   Pain in the chest 11/17/2014   Anxiety 11/17/2014   GERD (gastroesophageal reflux disease) 11/17/2014   Adjustment disorder with mixed anxiety and depressed mood 10/07/2014    Past Surgical History:  Procedure Laterality Date   ESOPHAGOGASTRODUODENOSCOPY     no past surgeries         Home Medications    Prior to Admission medications   Medication Sig Start Date End Date Taking? Authorizing Provider  pantoprazole  (PROTONIX ) 40 MG tablet Take 1 tablet (40 mg total) by mouth daily.Courtesy refill given, please keep appointment to receive additional refills. 10/09/23  Yes Celestia Rosaline SQUIBB, NP  cetirizine  (ZYRTEC ) 10 MG tablet Take 1 tablet (10 mg total) by mouth daily. 09/14/23   Celestia Rosaline SQUIBB, NP    Family History Family History  Problem Relation Age of Onset   Cancer Father    Diabetes Father    Heart attack Other     Social History Social History   Tobacco Use   Smoking status: Some Days    Types: Cigars   Smokeless tobacco: Never   Tobacco comments:    2 black and  milds daily.   Vaping Use   Vaping status: Never Used  Substance Use Topics   Alcohol use: Yes    Alcohol/week: 3.0 standard drinks of alcohol    Types: 3 Standard drinks or equivalent per week    Comment: occasional use   Drug use: Yes    Types: Marijuana     Allergies   Other, Red dye #40 (allura red), and Yellow dyes (non-tartrazine)   Review of Systems Review of Systems  Per HPI  Physical Exam Triage Vital Signs ED Triage Vitals  Encounter Vitals Group     BP 12/19/23 1340 126/84     Girls Systolic BP Percentile --      Girls Diastolic BP Percentile --      Boys Systolic BP Percentile --      Boys Diastolic BP Percentile --      Pulse Rate 12/19/23 1340 74     Resp 12/19/23 1340 18     Temp 12/19/23 1340 98.2 F (36.8 C)     Temp Source 12/19/23 1340 Oral     SpO2 12/19/23 1340 97 %     Weight --      Height --      Head Circumference --      Peak Flow --      Pain Score 12/19/23 1339 0     Pain Loc --  Pain Education --      Exclude from Growth Chart --    No data found.  Updated Vital Signs BP 126/84 (BP Location: Left Arm)   Pulse 74   Temp 98.2 F (36.8 C) (Oral)   Resp 18   SpO2 97%   Visual Acuity Right Eye Distance:   Left Eye Distance:   Bilateral Distance:    Right Eye Near:   Left Eye Near:    Bilateral Near:     Physical Exam Vitals and nursing note reviewed.  Constitutional:      General: He is awake. He is not in acute distress.    Appearance: Normal appearance. He is well-developed and well-groomed. He is not ill-appearing.  Genitourinary:    Comments: Exam deferred Neurological:     Mental Status: He is alert.  Psychiatric:        Behavior: Behavior is cooperative.      UC Treatments / Results  Labs (all labs ordered are listed, but only abnormal results are displayed) Labs Reviewed  HIV ANTIBODY (ROUTINE TESTING W REFLEX)  RPR  CYTOLOGY, (ORAL, ANAL, URETHRAL) ANCILLARY ONLY    EKG   Radiology No  results found.  Procedures Procedures (including critical care time)  Medications Ordered in UC Medications - No data to display  Initial Impression / Assessment and Plan / UC Course  I have reviewed the triage vital signs and the nursing notes.  Pertinent labs & imaging results that were available during my care of the patient were reviewed by me and considered in my medical decision making (see chart for details).     Patient is overall well-appearing.  Vitals are stable.  GU exam deferred.  Patient performed self swab for STD.  HIV and RPR ordered.  Discussed follow-up and return precautions. Final Clinical Impressions(s) / UC Diagnoses   Final diagnoses:  Screening for STD (sexually transmitted disease)     Discharge Instructions      Your results will come back over the next few days and someone will call if results are positive and require treatment.  Return here as needed.    ED Prescriptions   None    PDMP not reviewed this encounter.   Johnie Flaming A, NP 12/19/23 1351

## 2023-12-19 NOTE — Discharge Instructions (Signed)
 Your results will come back over the next few days and someone will call if results are positive and require treatment.  Return here as needed.

## 2023-12-20 LAB — CYTOLOGY, (ORAL, ANAL, URETHRAL) ANCILLARY ONLY
Chlamydia: NEGATIVE
Comment: NEGATIVE
Comment: NEGATIVE
Comment: NORMAL
Neisseria Gonorrhea: NEGATIVE
Trichomonas: NEGATIVE

## 2023-12-20 LAB — RPR: RPR Ser Ql: NONREACTIVE

## 2024-01-18 ENCOUNTER — Other Ambulatory Visit: Payer: Self-pay

## 2024-01-23 ENCOUNTER — Ambulatory Visit (HOSPITAL_COMMUNITY): Admitting: Licensed Clinical Social Worker

## 2024-01-24 ENCOUNTER — Other Ambulatory Visit: Payer: Self-pay

## 2024-01-24 ENCOUNTER — Ambulatory Visit (INDEPENDENT_AMBULATORY_CARE_PROVIDER_SITE_OTHER): Admitting: Licensed Clinical Social Worker

## 2024-01-24 DIAGNOSIS — F411 Generalized anxiety disorder: Secondary | ICD-10-CM

## 2024-01-24 DIAGNOSIS — F6381 Intermittent explosive disorder: Secondary | ICD-10-CM
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
## 2024-02-01 ENCOUNTER — Encounter (HOSPITAL_COMMUNITY): Payer: Self-pay | Admitting: Emergency Medicine

## 2024-02-01 ENCOUNTER — Ambulatory Visit (HOSPITAL_COMMUNITY)
Admission: EM | Admit: 2024-02-01 | Discharge: 2024-02-01 | Disposition: A | Discharge status: Home / Self Care | Attending: Internal Medicine | Admitting: Internal Medicine

## 2024-02-01 ENCOUNTER — Other Ambulatory Visit: Payer: Self-pay

## 2024-02-01 DIAGNOSIS — J069 Acute upper respiratory infection, unspecified: Secondary | ICD-10-CM

## 2024-02-01 LAB — POC SOFIA SARS ANTIGEN FIA: SARS Coronavirus 2 Ag: NEGATIVE

## 2024-02-01 NOTE — Discharge Instructions (Addendum)
 You have a viral illness which will improve on its own with rest, fluids, and medications to help with your symptoms.  Tylenol , guaifenesin (plain mucinex), and saline nasal sprays may help relieve symptoms.   Two teaspoons of honey in 1 cup of warm water every 4-6 hours may help with throat pains.  Humidifier in room at nighttime may help soothe cough (clean well daily).   Keep taking Nyquil for cough at bedtime.   For chest pain, shortness of breath, inability to keep food or fluids down without vomiting, fever that does not respond to tylenol  or motrin , or any other severe symptoms, please go to the ER for further evaluation. Return to urgent care as needed, otherwise follow-up with PCP.

## 2024-02-01 NOTE — ED Triage Notes (Signed)
 Symptoms started 2 days ago.  Patient reports coughing up phlegm-yellow per patient.  Has a head ache and body soreness, pressure in face around eyes.    Has had dayquil, nyquil, hot tea, mucinex on the first day when chest congestion was so bad

## 2024-02-01 NOTE — ED Provider Notes (Signed)
 [facility]    CSN: [id number] Arrival date & time: 02/01/24  1148      History   Chief Complaint Chief Complaint  Patient presents with   Cough    HPI Preston [name] is a 43 y.o. male.   Preston [name] is a 43 y.o. male presenting for chief complaint of cough, nasal congestion, sore throat, generalized fatigue, and chills that started 48 hours ago on Tuesday, January 30, 2024.  Cough is minimally productive with yellow/green sputum.  Sore throat is worsened by swallowing.  Denies recent sick contacts with similar symptoms.  No fevers, chills, nausea, vomiting, diarrhea, abdominal pain, dizziness, shortness of breath, chest tightness, or heart palpitations.  He smokes marijuana, denies other tobacco/drug use.  He had asthma as a child but states he has not needed an MDI inhaler in many years.  He is taking Mucinex, DayQuil, and NyQuil with minimal relief of symptoms.   Cough   Past Medical History:  Diagnosis Date   Acid reflux    Anxiety    Depression    Gastroparesis     Patient Active Problem List   Diagnosis Date Noted   Intermittent explosive disorder in adult 03/02/2021   Mild major depression, single episode 03/02/2021   GAD (generalized anxiety disorder) 03/02/2021   Obesity 09/30/2015   Pain in the chest 11/17/2014   Anxiety 11/17/2014   GERD (gastroesophageal reflux disease) 11/17/2014   Adjustment disorder with mixed anxiety and depressed mood 10/07/2014    Past Surgical History:  Procedure Laterality Date   ESOPHAGOGASTRODUODENOSCOPY     no past surgeries         Home Medications    Prior to Admission medications   Medication Sig Start Date End Date Taking? Authorizing Provider  cetirizine  (ZYRTEC ) 10 MG tablet Take 1 tablet (10 mg total) by mouth daily. 09/14/23   Celestia Rosaline SQUIBB, NP  pantoprazole  (PROTONIX ) 40 MG tablet Take 1 tablet (40 mg total) by mouth daily.Courtesy refill given, please keep appointment to receive  additional refills. 10/09/23   Celestia Rosaline SQUIBB, NP    Family History Family History  Problem Relation Age of Onset   Cancer Father    Diabetes Father    Heart attack Other     Social History Social History   Tobacco Use   Smoking status: Some Days    Types: Cigars   Smokeless tobacco: Never   Tobacco comments:    2 black and milds daily.   Vaping Use   Vaping status: Never Used  Substance Use Topics   Alcohol use: Yes    Alcohol/week: 3.0 standard drinks of alcohol    Types: 3 Standard drinks or equivalent per week    Comment: occasional use   Drug use: Yes    Types: Marijuana     Allergies   Other, Red dye #40 (allura red), and Yellow dyes (non-tartrazine)   Review of Systems Review of Systems  Respiratory:  Positive for cough.   Per HPI   Physical Exam Triage Vital Signs ED Triage Vitals  Encounter Vitals Group     BP 02/01/24 1322 (!) 143/88     Girls Systolic BP Percentile --      Girls Diastolic BP Percentile --      Boys Systolic BP Percentile --      Boys Diastolic BP Percentile --      Pulse Rate 02/01/24 1322 82     Resp 02/01/24 1322 18  Temp 02/01/24 1322 98.6 F (37 C)     Temp Source 02/01/24 1322 Oral     SpO2 02/01/24 1322 96 %     Weight --      Height --      Head Circumference --      Peak Flow --      Pain Score 02/01/24 1319 8     Pain Loc --      Pain Education --      Exclude from Growth Chart --    No data found.  Updated Vital Signs BP (!) 143/88 (BP Location: Right Arm) Comment (BP Location): large cuff  Pulse 82   Temp 98.6 F (37 C) (Oral)   Resp 18   SpO2 96%   Visual Acuity Right Eye Distance:   Left Eye Distance:   Bilateral Distance:    Right Eye Near:   Left Eye Near:    Bilateral Near:     Physical Exam Vitals and nursing note reviewed.  Constitutional:      Appearance: He is not ill-appearing or toxic-appearing.  HENT:     Head: Normocephalic and atraumatic.     Right Ear: Hearing,  tympanic membrane, ear canal and external ear normal.     Left Ear: Hearing, tympanic membrane, ear canal and external ear normal.     Nose: Congestion present.     Mouth/Throat:     Lips: Pink.     Mouth: Mucous membranes are moist. No injury or oral lesions.     Dentition: Normal dentition.     Tongue: No lesions.     Pharynx: Oropharynx is clear. Uvula midline. Posterior oropharyngeal erythema present. No pharyngeal swelling, oropharyngeal exudate, uvula swelling or postnasal drip.     Tonsils: No tonsillar exudate.     Comments: Mild erythema to posterior oropharynx with small amount of clear postnasal drainage visualized.  Eyes:     General: Lids are normal. Vision grossly intact. Gaze aligned appropriately.     Extraocular Movements: Extraocular movements intact.     Conjunctiva/sclera: Conjunctivae normal.  Neck:     Trachea: Trachea and phonation normal.  Cardiovascular:     Rate and Rhythm: Normal rate and regular rhythm.     Heart sounds: Normal heart sounds, S1 normal and S2 normal.  Pulmonary:     Effort: Pulmonary effort is normal. No respiratory distress.     Breath sounds: Normal breath sounds and air entry. No wheezing, rhonchi or rales.  Chest:     Chest wall: No tenderness.  Musculoskeletal:     Cervical back: Neck supple.  Lymphadenopathy:     Cervical: No cervical adenopathy.  Skin:    General: Skin is warm and dry.     Capillary Refill: Capillary refill takes less than 2 seconds.     Findings: No rash.  Neurological:     General: No focal deficit present.     Mental Status: He is alert and oriented to person, place, and time. Mental status is at baseline.     Cranial Nerves: No dysarthria or facial asymmetry.  Psychiatric:        Mood and Affect: Mood normal.        Speech: Speech normal.        Behavior: Behavior normal.        Thought Content: Thought content normal.        Judgment: Judgment normal.      UC Treatments / Results  Labs (all labs  ordered are listed, but only abnormal results are displayed) Labs Reviewed  POC SOFIA SARS ANTIGEN FIA    EKG   Radiology No results found.  Procedures Procedures (including critical care time)  Medications Ordered in UC Medications - No data to display  Initial Impression / Assessment and Plan / UC Course  I have reviewed the triage vital signs and the nursing notes.  Pertinent labs & imaging results that were available during my care of the patient were reviewed by me and considered in my medical decision making (see chart for details).   1.  Viral URI with cough Suspect viral URI, viral syndrome.  Strep/viral testing: POC COVID is negative.   Physical exam findings reassuring, vital signs hemodynamically stable, and lungs clear, therefore deferred imaging of the chest.  Advised supportive care/prescriptions for symptomatic relief as outlined in AVS.    Counseled patient on potential for adverse effects with medications prescribed/recommended today, strict ER and return-to-clinic precautions discussed, patient verbalized understanding.    Final Clinical Impressions(s) / UC Diagnoses   Final diagnoses:  Viral URI with cough     Discharge Instructions      You have a viral illness which will improve on its own with rest, fluids, and medications to help with your symptoms.  Tylenol , guaifenesin (plain mucinex), and saline nasal sprays may help relieve symptoms.   Two teaspoons of honey in 1 cup of warm water every 4-6 hours may help with throat pains.  Humidifier in room at nighttime may help soothe cough (clean well daily).   Keep taking Nyquil for cough at bedtime.   For chest pain, shortness of breath, inability to keep food or fluids down without vomiting, fever that does not respond to tylenol  or motrin , or any other severe symptoms, please go to the ER for further evaluation. Return to urgent care as needed, otherwise follow-up with PCP.       ED  Prescriptions   None    PDMP not reviewed this encounter.   Enedelia Dorna HERO, [place] 02/01/24 1415

## 2024-02-13 ENCOUNTER — Ambulatory Visit (HOSPITAL_COMMUNITY): Admitting: Licensed Clinical Social Worker

## 2024-02-14 ENCOUNTER — Ambulatory Visit (INDEPENDENT_AMBULATORY_CARE_PROVIDER_SITE_OTHER): Admitting: Licensed Clinical Social Worker

## 2024-02-14 DIAGNOSIS — F6381 Intermittent explosive disorder: Secondary | ICD-10-CM | POA: Diagnosis not present

## 2024-02-14 DIAGNOSIS — F320 Major depressive disorder, single episode, mild: Secondary | ICD-10-CM

## 2024-02-14 DIAGNOSIS — F411 Generalized anxiety disorder: Secondary | ICD-10-CM
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
## 2024-02-26 ENCOUNTER — Telehealth (INDEPENDENT_AMBULATORY_CARE_PROVIDER_SITE_OTHER): Payer: Self-pay | Admitting: Primary Care

## 2024-02-26 NOTE — Telephone Encounter (Signed)
 Called pt but pt did not answer. Please reschedule pt appt.

## 2024-02-27 ENCOUNTER — Other Ambulatory Visit: Payer: Self-pay

## 2024-02-28 ENCOUNTER — Encounter (INDEPENDENT_AMBULATORY_CARE_PROVIDER_SITE_OTHER): Payer: Self-pay

## 2024-02-28 ENCOUNTER — Ambulatory Visit (HOSPITAL_COMMUNITY): Admitting: Licensed Clinical Social Worker

## 2024-02-28 ENCOUNTER — Ambulatory Visit (INDEPENDENT_AMBULATORY_CARE_PROVIDER_SITE_OTHER): Admitting: Primary Care

## 2024-02-28 DIAGNOSIS — F320 Major depressive disorder, single episode, mild: Secondary | ICD-10-CM

## 2024-02-28 DIAGNOSIS — F6381 Intermittent explosive disorder: Secondary | ICD-10-CM

## 2024-02-28 DIAGNOSIS — F411 Generalized anxiety disorder: Secondary | ICD-10-CM

## 2024-02-28 NOTE — Progress Notes (Signed)
 THERAPIST PROGRESS NOTE  Session Time: 24  Participation Level: Active  Behavioral Response: CasualAlertAnxious and Depressed  Type of Therapy: Individual Therapy  Treatment Goals addressed:   Active     Anger Management Problem  1 IDD      Decrease PHQ-9 below 5  (Completed/Met)     Start:  03/02/21    Expected End:  09/15/21    Resolved:  06/22/21      Decrease GAD-7 below 5  (Completed/Met)     Start:  03/02/21    Expected End:  06/16/22    Resolved:  03/16/22      Attend 1 mental [name] support group meeting monthly for 6 months.  (Progressing)     Start:  03/02/21    Expected End:  04/19/24         STG: Preston [name] will identify situations, thoughts, and feelings that trigger internal anger, angry verbal and/or aggressive behavioral actions as evidenced by a self-recorded report. (Completed/Met)     Start:  03/02/21    Expected End:  09/15/21    Resolved:  06/22/21       identify three triggers for anxiety and depression (Progressing)     Start:  12/08/21    Expected End:  04/19/24         EDUCATE Preston [name] ON ANGER MANAGEMENT SKILLS AND THE RATIONALE FOR LEARNING THESE SKILLS (Completed)     Start:  03/02/21    End:  11/10/21      EDUCATE Preston [name] ON ANGER PAYOFFS (INTERNAL OR EXTERNAL REINFORCERS OF ANGER RESPONSE) AND THE RATIONALE FOR IDENTIFYING REINFORCERS OF ANGRY RESPONSES (Completed)     Start:  03/02/21    End:  11/10/21      EDUCATE Preston [name] ON RELAXATION TECHNIQUES AND THE RATIONALE FOR LEARNING THESE TECHNIQUES (Completed)     Start:  03/02/21    End:  11/10/21      ENCOURAGE Preston [name] TO PRACTICE RELAXATION SKILLS AT HOME FOR 10 MINUTES, 2 TIMES PER DAY, FOR 7 DAYS (Completed)     Start:  03/02/21    End:  11/10/21      WORK WITH Preston [name] TO IDENTIFY AT LEAST 1 TRIGGER THOUGHTS (THOUGHTS THAT TRIGGER AN ANGER RESPONSE) (Completed)     Start:  03/02/21    End:  11/10/21      EDUCATE Preston [name] ON THE 6 CATEGORIES OF COGNITIVE DISTORTION AND  HOW THEY PROMOTE ANGER (Completed)     Start:  03/02/21    End:  03/16/22      WORK WITH Preston [name] TO DEVELOP A COPING PLAN (Completed)     Start:  03/02/21    End:  03/16/22        Anxiety     STG: Preston [name] will complete at least 80% of assigned homework  (Progressing)     Start:  04/26/22    Expected End:  04/19/24         STG: Preston [name] will practice problem solving skills 3 times per week for the next 4 weeks.  (Progressing)     Start:  04/26/22    Expected End:  04/19/24         add three coping skills for anxiety  (Progressing)     Start:  04/26/22    Expected End:  04/19/24         what are three trigger for anxiety.  (Not Applicable)     Start:  04/26/22    Expected End:  09/15/23    Resolved:  07/05/23  Goal Note     Duplicate goal          Discuss risks and benefits of medication treatment options for this problem and prescribe as indicated (Completed)     Start:  04/26/22    End:  05/25/22      Encourage Preston [name] to take psychotropic medication(s) as prescribed (Completed)     Start:  04/26/22    End:  06/15/22      Review results of GAD-7 with Preston [name] to track progress (Completed)     Start:  04/26/22    End:  06/15/22      Work with Preston [name] to track symptoms, triggers, and/or skill use through a mood chart, diary card, or journal (Completed)     Start:  04/26/22    End:  05/25/22      Perform psychoeducation regarding anxiety disorders (Completed)     Start:  04/26/22    End:  05/25/22      Provide Preston [name] with educational information and reading material on anxiety, its causes, and symptoms.  (Completed)     Start:  04/26/22    End:  06/15/22         ProgressTowards Goals: Progressing  Interventions: CBT, Motivational Interviewing, and Supportive  Suicidal/Homicidal: Nowithout intent/plan  S: Subjective  Patient was alert and oriented 5. Preston [name] was pleasant, cooperative, and maintained good eye contact. He was dressed casually  and engaged well throughout the therapy session. Patient presented with a depressed and anxious mood and affect.  Patient reports experiencing grief and loss related to the death of his father. He states that due to ongoing legal issues, he has been unable to fully process his grief. LCSW provided psychoeducation on the stages of grief and loss and encouraged the patient to externalize his emotions. Patient shared that he often displaces and projects anger onto others. LCSW provided education on cognitive distortions and defense mechanisms to increase patient insight.  LCSW noted that the patient continues to struggle with a friendship with a woman named Preston [name]. Patient reports feeling that the  friendship "failed". He described a recent situation where Preston [name] had sexual relations  with four other people without protection and did not inform him until he confronted her. Patient stated he would like to maintain the friendship but believes Preston [name] may only want to engage in sexual relations.  O: Objective  Patient was cooperative, maintained good eye contact, and demonstrated appropriate insight into his emotions and interpersonal dynamics. Mood appeared depressed and anxious. Affect was congruent with mood.  A: Assessment  Depressive symptoms associated with grief and interpersonal conflict  Unresolved grief related to the death of father  Maladaptive coping patterns (displacement, projection)  Difficulty establishing and maintaining healthy interpersonal boundaries  P: Plan / Interventions  LCSW provided education on grief and loss, cognitive distortions, and defense mechanisms.  LCSW discussed and practiced assertive communication strategies.  LCSW used psychodynamic therapy techniques to help patient express thoughts, feelings, and emotions in a nonjudgmental environment.  LCSW validated patient's emotions throughout session.  Motivational interviewing techniques were utilized to  promote insight, open-ended dialogue, and self-reflection.  Continue to explore healthy boundary-setting and emotional regulation in future sessions.  Plan: Return again in 4 weeks.  Diagnosis: GAD (generalized anxiety disorder)  Mild major depression, single episode  Intermittent explosive disorder in adult  Collaboration of Care: Other    Patient/Guardian was advised Release of Information must be obtained prior to any record release in order to collaborate their care with  an outside provider. Patient/Guardian was advised if they have not already done so to contact the registration department to sign all necessary forms in order for us  to release information regarding their care.   Consent: Patient/Guardian gives verbal consent for treatment and assignment of benefits for services provided during this visit. Patient/Guardian expressed understanding and agreed to proceed.   Juliene GORMAN Patee, LCSW 02/28/2024

## 2024-03-20 ENCOUNTER — Ambulatory Visit (HOSPITAL_COMMUNITY): Payer: Self-pay | Admitting: Licensed Clinical Social Worker

## 2024-03-20 ENCOUNTER — Encounter (HOSPITAL_COMMUNITY): Payer: Self-pay

## 2024-03-22 ENCOUNTER — Other Ambulatory Visit (INDEPENDENT_AMBULATORY_CARE_PROVIDER_SITE_OTHER): Payer: Self-pay | Admitting: Primary Care

## 2024-03-26 ENCOUNTER — Other Ambulatory Visit: Payer: Self-pay

## 2024-03-26 MED ORDER — CETIRIZINE HCL 10 MG PO TABS
10.0000 mg | ORAL_TABLET | Freq: Every day | ORAL | 0 refills | Status: AC
  Filled 2024-03-26: qty 90, 90d supply, fill #0
  Filled ????-??-??: fill #0

## 2024-03-26 NOTE — Telephone Encounter (Signed)
 Requested Prescriptions  Pending Prescriptions Disp Refills   cetirizine  (ZYRTEC ) 10 MG tablet 90 tablet 0    Sig: Take 1 tablet (10 mg total) by mouth daily.     Ear, Nose, and Throat:  Antihistamines 2 Passed - 03/26/2024 11:16 AM      Passed - Cr in normal range and within 360 days    Creat  Date Value Ref Range Status  10/02/2015 1.18 0.60 - 1.35 mg/dL Final   Creatinine, Ser  Date Value Ref Range Status  09/27/2023 1.08 0.76 - 1.27 mg/dL Final         Passed - Valid encounter within last 12 months    Recent Outpatient Visits           3 months ago BP check   [place] [facility] Celestia Rosaline SQUIBB, NP   6 months ago Essential hypertension   [place] [facility] Celestia Rosaline SQUIBB, NP   9 months ago Essential hypertension   [place] [facility] Celestia Rosaline SQUIBB, NP   10 months ago Elevated blood pressure reading without diagnosis of hypertension   [place] [facility] Celestia Rosaline SQUIBB, NP   1 year ago Elevated blood pressure reading without diagnosis of hypertension   [place] [facility] Celestia Rosaline SQUIBB, NP

## 2024-04-02 ENCOUNTER — Ambulatory Visit (INDEPENDENT_AMBULATORY_CARE_PROVIDER_SITE_OTHER): Admitting: Licensed Clinical Social Worker

## 2024-04-02 DIAGNOSIS — F6381 Intermittent explosive disorder: Secondary | ICD-10-CM

## 2024-04-02 NOTE — Progress Notes (Signed)
 THERAPIST PROGRESS NOTE  Session Time: 1  Participation Level: Active  Behavioral Response: CasualAlertEuthymic  Type of Therapy: Individual Therapy  Treatment Goals addressed:  Active     Anger Management Problem  1 IDD      Decrease PHQ-9 below 5  (Completed/Met)     Start:  03/02/21    Expected End:  09/15/21    Resolved:  06/22/21      Decrease GAD-7 below 5  (Completed/Met)     Start:  03/02/21    Expected End:  06/16/22    Resolved:  03/16/22      Attend 1 mental health support group meeting monthly for 6 months.  (Progressing)     Start:  03/02/21    Expected End:  04/19/24         STG: Parminder will identify situations, thoughts, and feelings that trigger internal anger, angry verbal and/or aggressive behavioral actions as evidenced by a self-recorded report. (Completed/Met)     Start:  03/02/21    Expected End:  09/15/21    Resolved:  06/22/21       identify three triggers for anxiety and depression (Progressing)     Start:  12/08/21    Expected End:  04/19/24         EDUCATE Penne ON ANGER MANAGEMENT SKILLS AND THE RATIONALE FOR LEARNING THESE SKILLS (Completed)     Start:  03/02/21    End:  11/10/21      EDUCATE Harshith ON ANGER PAYOFFS (INTERNAL OR EXTERNAL REINFORCERS OF ANGER RESPONSE) AND THE RATIONALE FOR IDENTIFYING REINFORCERS OF ANGRY RESPONSES (Completed)     Start:  03/02/21    End:  11/10/21      EDUCATE Thedford ON RELAXATION TECHNIQUES AND THE RATIONALE FOR LEARNING THESE TECHNIQUES (Completed)     Start:  03/02/21    End:  11/10/21      ENCOURAGE Santosh TO PRACTICE RELAXATION SKILLS AT HOME FOR 10 MINUTES, 2 TIMES PER DAY, FOR 7 DAYS (Completed)     Start:  03/02/21    End:  11/10/21      WORK WITH Rocko TO IDENTIFY AT LEAST 1 TRIGGER THOUGHTS (THOUGHTS THAT TRIGGER AN ANGER RESPONSE) (Completed)     Start:  03/02/21    End:  11/10/21      EDUCATE Cornelis ON THE 6 CATEGORIES OF COGNITIVE DISTORTION AND HOW THEY  PROMOTE ANGER (Completed)     Start:  03/02/21    End:  03/16/22      WORK WITH Penne TO DEVELOP A COPING PLAN (Completed)     Start:  03/02/21    End:  03/16/22        Anxiety     STG: Penne will complete at least 80% of assigned homework  (Progressing)     Start:  04/26/22    Expected End:  04/19/24         STG: Penne will practice problem solving skills 3 times per week for the next 4 weeks.  (Progressing)     Start:  04/26/22    Expected End:  04/19/24         add three coping skills for anxiety  (Progressing)     Start:  04/26/22    Expected End:  04/19/24         what are three trigger for anxiety.  (Not Applicable)     Start:  04/26/22    Expected End:  09/15/23    Resolved:  07/05/23  Goal Note     Duplicate goal          Discuss risks and benefits of medication treatment options for this problem and prescribe as indicated (Completed)     Start:  04/26/22    End:  05/25/22      Encourage Penne to take psychotropic medication(s) as prescribed (Completed)     Start:  04/26/22    End:  06/15/22      Review results of GAD-7 with Penne to track progress (Completed)     Start:  04/26/22    End:  06/15/22      Work with Penne to track symptoms, triggers, and/or skill use through a mood chart, diary card, or journal (Completed)     Start:  04/26/22    End:  05/25/22      Perform psychoeducation regarding anxiety disorders (Completed)     Start:  04/26/22    End:  05/25/22      Provide Penne with educational information and reading material on anxiety, its causes, and symptoms.  (Completed)     Start:  04/26/22    End:  06/15/22         ProgressTowards Goals: Progressing  Interventions: CBT, Motivational Interviewing, and Reframing  Summary: LEANDER TOUT is a 43 y.o. male who presents with    Suicidal/Homicidal: Nowithout intent/plan  Therapist Response:    S - Subjective  Brently presents reporting ongoing stress  related to family conflict and work-related issues. He reports emotional tension surrounding his relationship with his half-siblings, stating he has made multiple efforts to connect, which have not been reciprocated. Sherri described attending a gathering at his fathers family home, noting the experience felt weird, as corroborated by a friend who accompanied him. He expressed frustration and emotional fatigue related to putting effort into relationships that feel one-sided, stating he is becoming increasingly discouraged.  Babak also reports significant work-related stress following a verbal altercation with a colleague. He stated the conflict arose after being accused of not cleaning a floor, despite having previously confirmed with the colleague that his tasks were complete prior to clocking out. Tirth reports the incident escalated to the point that HR and law enforcement were involved, and he is now required to provide his account to HR. He reports heightened stress related to the situation but denies physical aggression or intent to harm.  Khoen denies suicidal or homicidal ideation. No safety concerns reported.  O - Objective  Sunny was alert and oriented 5. He appeared pleasant, cooperative, and maintained good eye contact throughout the session. He was casually dressed with appropriate hygiene. Mood was euthymic with euphoric affect. Speech was normal in rate, tone, and volume. Thought processes were logical and goal-directed. No evidence of psychosis, cognitive impairment, or behavioral dysregulation observed.  Christ was actively engaged in the therapeutic process during the 45-minute session.  A - Assessment  Ansar presents with situational stress and emotional reactivity related to interpersonal conflict within family relationships and workplace interactions. Symptoms include frustration, tension, and emotional fatigue, which are impacting his emotional well-being and  stress tolerance. He demonstrates insight into his emotional responses and has begun utilizing adaptive coping strategies.  Client continues to benefit from psychotherapy focused on emotional regulation, interpersonal boundaries, and stress management. Risk level assessed as low at this time.  P - Plan  Interventions Provided:  Psychodynamic therapy to facilitate expression of thoughts, feelings, and relational concerns in a nonjudgmental environment  Supportive therapy to provide encouragement, validation,  and emotional reinforcement  Psychoeducation and reinforcement of emotional regulation techniques, including temperature-based grounding strategies  Validation of clients emotional experience using empathic responses  Treatment Focus:  Improve emotional regulation during interpersonal conflict  Develop healthy boundaries in family and work relationships  Reduce stress reactivity and improve coping strategies  Plan:  Continue individual psychotherapy sessions at the 45-minute level  Encourage continued use of grounding and emotional regulation strategies  Next appointment scheduled for January 5th at 1:00 PM (in person)  Plan: Return again in 3 weeks.  Diagnosis: Intermittent explosive disorder in adult  Collaboration of Care: Other continued individual therapy every 3 weeks   Patient/Guardian was advised Release of Information must be obtained prior to any record release in order to collaborate their care with an outside provider. Patient/Guardian was advised if they have not already done so to contact the registration department to sign all necessary forms in order for us  to release information regarding their care.   Consent: Patient/Guardian gives verbal consent for treatment and assignment of benefits for services provided during this visit. Patient/Guardian expressed understanding and agreed to proceed.   Juliene GORMAN Patee, LCSW 04/02/2024

## 2024-04-04 ENCOUNTER — Other Ambulatory Visit: Payer: Self-pay

## 2024-04-15 ENCOUNTER — Other Ambulatory Visit (INDEPENDENT_AMBULATORY_CARE_PROVIDER_SITE_OTHER): Payer: Self-pay | Admitting: Primary Care

## 2024-04-15 DIAGNOSIS — K219 Gastro-esophageal reflux disease without esophagitis: Secondary | ICD-10-CM

## 2024-04-16 ENCOUNTER — Other Ambulatory Visit: Payer: Self-pay

## 2024-04-16 MED ORDER — PANTOPRAZOLE SODIUM 40 MG PO TBEC
40.0000 mg | DELAYED_RELEASE_TABLET | Freq: Every day | ORAL | 0 refills | Status: AC
  Filled 2024-04-23: qty 90, 90d supply, fill #0

## 2024-04-16 NOTE — Telephone Encounter (Signed)
 Requested Prescriptions  Pending Prescriptions Disp Refills   pantoprazole  (PROTONIX ) 40 MG tablet 90 tablet 0    Sig: Take 1 tablet (40 mg total) by mouth daily.Courtesy refill given, please keep appointment to receive additional refills.     Gastroenterology: Proton Pump Inhibitors Passed - 04/16/2024  3:32 PM      Passed - Valid encounter within last 12 months    Recent Outpatient Visits           4 months ago BP check   [place] [facility] Celestia Rosaline SQUIBB, NP   6 months ago Essential hypertension   [place] [facility] Celestia Rosaline SQUIBB, NP   10 months ago Essential hypertension   [place] [facility] Celestia Rosaline SQUIBB, NP   10 months ago Elevated blood pressure reading without diagnosis of hypertension   [place] [facility] Celestia Rosaline SQUIBB, NP   2 years ago Elevated blood pressure reading without diagnosis of hypertension   [place] [facility] Celestia Rosaline SQUIBB, NP

## 2024-04-22 ENCOUNTER — Ambulatory Visit (HOSPITAL_COMMUNITY): Payer: Self-pay | Admitting: Licensed Clinical Social Worker

## 2024-04-22 ENCOUNTER — Encounter (HOSPITAL_COMMUNITY): Payer: Self-pay

## 2024-04-22 ENCOUNTER — Telehealth (INDEPENDENT_AMBULATORY_CARE_PROVIDER_SITE_OTHER): Payer: Self-pay | Admitting: Primary Care

## 2024-04-22 NOTE — Telephone Encounter (Signed)
 Called pt to confirm appt. Pt did not answer and LVM

## 2024-04-23 ENCOUNTER — Ambulatory Visit (INDEPENDENT_AMBULATORY_CARE_PROVIDER_SITE_OTHER): Admitting: Primary Care

## 2024-04-23 ENCOUNTER — Ambulatory Visit (HOSPITAL_COMMUNITY)
Admission: EM | Admit: 2024-04-23 | Discharge: 2024-04-23 | Disposition: A | Discharge status: Home / Self Care | Payer: Self-pay | Attending: Emergency Medicine | Admitting: Emergency Medicine

## 2024-04-23 ENCOUNTER — Ambulatory Visit (HOSPITAL_COMMUNITY): Payer: Self-pay

## 2024-04-23 ENCOUNTER — Encounter (HOSPITAL_COMMUNITY): Payer: Self-pay | Admitting: *Deleted

## 2024-04-23 ENCOUNTER — Ambulatory Visit (HOSPITAL_COMMUNITY): Payer: Self-pay | Admitting: Emergency Medicine

## 2024-04-23 ENCOUNTER — Other Ambulatory Visit: Payer: Self-pay

## 2024-04-23 DIAGNOSIS — S93491A Sprain of other ligament of right ankle, initial encounter: Secondary | ICD-10-CM

## 2024-04-23 NOTE — ED Triage Notes (Signed)
 Pt states he was playing basketball 2 days ago and hurt his right ankle. He has ben icing and resting but still pain.

## 2024-04-23 NOTE — Discharge Instructions (Addendum)
 I did not see fractures on your x-ray  Ice and elevate the ankle, you can take 800 mg of ibuprofen  every 8 hours to help with pain and swelling Wear the brace when walking to help provide support I have attached some exercises and we have discussed some to start to help build strength in the ankle  If the ankle pain does not improve over the next few weeks or if something changes please seek follow-up care with orthopedic

## 2024-04-23 NOTE — ED Provider Notes (Signed)
 " [facility]    CSN: [id number] Arrival date & time: 04/23/24  1308      History   Chief Complaint Chief Complaint  Patient presents with   Ankle Pain    HPI Preston [name] is a 44 y.o. male.   Patient presents to clinic over concern of right ankle pain and swelling.  2 days ago he was playing basketball when he came down wrong on the ankle.  Thinks he twisted the ankle inward and outward but is unsure.  The area has been tender and sore, he has been icing the area at home.  Ambulatory.  Previous injuries to the ankle from playing sports when he was younger, no surgeries.   The history is provided by the patient and medical records.  Ankle Pain   Past Medical History:  Diagnosis Date   Acid reflux    Anxiety    Depression    Gastroparesis     Patient Active Problem List   Diagnosis Date Noted   Intermittent explosive disorder in adult 03/02/2021   Mild major depression, single episode 03/02/2021   GAD (generalized anxiety disorder) 03/02/2021   Obesity 09/30/2015   Pain in the chest 11/17/2014   Anxiety 11/17/2014   GERD (gastroesophageal reflux disease) 11/17/2014   Adjustment disorder with mixed anxiety and depressed mood 10/07/2014    Past Surgical History:  Procedure Laterality Date   ESOPHAGOGASTRODUODENOSCOPY     no past surgeries         Home Medications    Prior to Admission medications  Medication Sig Start Date End Date Taking? Authorizing Provider  cetirizine  (ZYRTEC ) 10 MG tablet Take 1 tablet (10 mg total) by mouth daily. 03/26/24  Yes Celestia Rosaline SQUIBB, NP  pantoprazole  (PROTONIX ) 40 MG tablet Take 1 tablet (40 mg total) by mouth daily.Courtesy refill given, please keep appointment to receive additional refills. 04/16/24  Yes Celestia Rosaline SQUIBB, NP    Family History Family History  Problem Relation Age of Onset   Cancer Father    Diabetes Father    Heart attack Other     Social History Social History[1]   Allergies    Other; Red dye #40 (allura red); and Yellow dyes 6, 10, and 11   Review of Systems Review of Systems  Per HPI  Physical Exam Triage Vital Signs ED Triage Vitals  Encounter Vitals Group     BP 04/23/24 1402 (!) 133/92     Girls Systolic BP Percentile --      Girls Diastolic BP Percentile --      Boys Systolic BP Percentile --      Boys Diastolic BP Percentile --      Pulse Rate 04/23/24 1402 76     Resp 04/23/24 1402 18     Temp 04/23/24 1402 98.3 F (36.8 C)     Temp Source 04/23/24 1402 Oral     SpO2 04/23/24 1402 97 %     Weight --      Height --      Head Circumference --      Peak Flow --      Pain Score 04/23/24 1401 8     Pain Loc --      Pain Education --      Exclude from Growth Chart --    No data found.  Updated Vital Signs BP (!) 133/92 (BP Location: Left Arm)   Pulse 76   Temp 98.3 F (36.8 C) (Oral)  Resp 18   SpO2 97%   Visual Acuity Right Eye Distance:   Left Eye Distance:   Bilateral Distance:    Right Eye Near:   Left Eye Near:    Bilateral Near:     Physical Exam Vitals and nursing note reviewed.  Constitutional:      Appearance: Normal appearance.  HENT:     Head: Normocephalic and atraumatic.     Right Ear: External ear normal.     Left Ear: External ear normal.     Nose: Nose normal.     Mouth/Throat:     Mouth: Mucous membranes are moist.  Eyes:     Conjunctiva/sclera: Conjunctivae normal.  Cardiovascular:     Rate and Rhythm: Normal rate.     Pulses: Normal pulses.          Dorsalis pedis pulses are 2+ on the right side.       Posterior tibial pulses are 2+ on the right side.  Musculoskeletal:        General: Swelling, tenderness and signs of injury present. Normal range of motion.       Feet:  Feet:     Comments: Ankle inversion causes pain along lateral ankle, full range of motion intact.  Brisk capillary refill distally. Skin:    General: Skin is warm and dry.  Neurological:     General: No focal deficit  present.     Mental Status: He is alert.  Psychiatric:        Mood and Affect: Mood normal.        Behavior: Behavior is cooperative.      UC Treatments / Results  Labs (all labs ordered are listed, but only abnormal results are displayed) Labs Reviewed - No data to display  EKG   Radiology No results found.  Procedures Procedures (including critical care time)  Medications Ordered in UC Medications - No data to display  Initial Impression / Assessment and Plan / UC Course  I have reviewed the triage vital signs and the nursing notes.  Pertinent labs & imaging results that were available during my care of the patient were reviewed by me and considered in my medical decision making (see chart for details).  Vitals and triage reviewed, patient is hemodynamically stable.  Right lateral ankle with tenderness to palpation and soft tissue swelling.  Ambulatory.  Imaging by my interpretation does not show acute bony abnormality.  Will provide with ankle brace and symptomatic management for ankle sprain discussed.  Plan of care, follow-up care and return precautions given, no questions at this time.  Work note provided.    Final Clinical Impressions(s) / UC Diagnoses   Final diagnoses:  Sprain of anterior talofibular ligament of right ankle, initial encounter     Discharge Instructions      I did not see fractures on your x-ray  Ice and elevate the ankle, you can take 800 mg of ibuprofen  every 8 hours to help with pain and swelling Wear the brace when walking to help provide support I have attached some exercises and we have discussed some to start to help build strength in the ankle  If the ankle pain does not improve over the next few weeks or if something changes please seek follow-up care with orthopedic     ED Prescriptions   None    PDMP not reviewed this encounter.     [1]  Social History Tobacco Use   Smoking status: Some Days  Types: Cigars    Smokeless tobacco: Never   Tobacco comments:    2 black and milds daily.   Vaping Use   Vaping status: Never Used  Substance Use Topics   Alcohol use: Yes    Alcohol/week: 3.0 standard drinks of alcohol    Types: 3 Standard drinks or equivalent per week    Comment: occasional use   Drug use: Yes    Types: Marijuana     Dreama Clayborn SAILOR, FNP 04/23/24 1504  "

## 2024-04-24 ENCOUNTER — Other Ambulatory Visit: Payer: Self-pay

## 2024-04-24 ENCOUNTER — Ambulatory Visit (HOSPITAL_COMMUNITY)
Admission: EM | Admit: 2024-04-24 | Discharge: 2024-04-24 | Disposition: A | Discharge status: Home / Self Care | Payer: Self-pay | Attending: Family Medicine | Admitting: Family Medicine

## 2024-04-24 DIAGNOSIS — S93431A Sprain of tibiofibular ligament of right ankle, initial encounter: Secondary | ICD-10-CM | POA: Diagnosis not present

## 2024-04-24 NOTE — ED Triage Notes (Signed)
 Patient was seen on 04/23/2024.  Provider called patient and discussed final xray reading.  Discussion included returning for a CAM boot.   Patient has returned .

## 2024-04-24 NOTE — Discharge Instructions (Signed)
 Follow up as instructed at prior visit and phone conversation with provider.  Absolutely no driving while wearing cam boot

## 2024-04-24 NOTE — ED Notes (Signed)
 Patient says he was told to use equipment for 2 weeks, if no improvement at that time he is to return to [facility].    Stated repeatedly to patient that he is not to drive with CAM boot.  Patient states he understands the concern and agrees not to drive while wearing boot

## 2024-05-01 ENCOUNTER — Ambulatory Visit (HOSPITAL_COMMUNITY): Payer: Self-pay | Admitting: Licensed Clinical Social Worker

## 2024-05-01 DIAGNOSIS — F6381 Intermittent explosive disorder: Secondary | ICD-10-CM
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
## 2024-05-08 ENCOUNTER — Telehealth (INDEPENDENT_AMBULATORY_CARE_PROVIDER_SITE_OTHER): Payer: Self-pay | Admitting: Primary Care

## 2024-05-08 NOTE — Telephone Encounter (Signed)
 Called pt to confirm appt. Pt did not answer and LVM for upcoming appt.

## 2024-05-09 ENCOUNTER — Encounter (INDEPENDENT_AMBULATORY_CARE_PROVIDER_SITE_OTHER): Payer: Self-pay | Admitting: Primary Care

## 2024-05-09 ENCOUNTER — Ambulatory Visit (INDEPENDENT_AMBULATORY_CARE_PROVIDER_SITE_OTHER): Admitting: Primary Care

## 2024-05-09 VITALS — BP 121/79 | HR 76 | Resp 16 | Ht 77.0 in | Wt 331.0 lb

## 2024-05-09 DIAGNOSIS — R7303 Prediabetes: Secondary | ICD-10-CM | POA: Diagnosis not present

## 2024-05-09 DIAGNOSIS — E782 Mixed hyperlipidemia: Secondary | ICD-10-CM

## 2024-05-09 DIAGNOSIS — Z0000 Encounter for general adult medical examination without abnormal findings: Secondary | ICD-10-CM

## 2024-05-09 NOTE — Progress Notes (Signed)
 " [facility]  Preston [name] is a 44 y.o. male presents to office today for annual physical exam examination.  Patient arrived in a foot brace, he dropped a box on his right foot at work.  Was seen at urgent care and diagnosed with sprain of anterior talofibular ligament of the right ankle.  Concerns today include: 1. none  Occupation: Security , Marital status: S, Substance use: N Diet: Regular, Exercise: walking in the gym  Health Maintenance  Topic Date Due   COVID-19 Vaccine (1 - 2025-26 season) Never done   Pneumococcal Vaccine (1 of 2 - PCV) 05/23/2024 (Originally 08/02/2000)   Influenza Vaccine  07/16/2024 (Originally 11/17/2023)   Hepatitis B Vaccines 19-59 Average Risk (1 of 3 - 19+ 3-dose series) 11/28/2024 (Originally 08/02/2000)   DTaP/Tdap/Td (2 - Td or Tdap) 11/03/2026   HPV VACCINES (No Doses Required) Completed   Hepatitis C Screening  Completed   HIV Screening  Completed   Meningococcal B Vaccine  Aged Out    Past Medical History:  Diagnosis Date   Acid reflux    Anxiety    Depression    Gastroparesis    Social History   Socioeconomic History   Marital status: Single    Spouse name: Not on file   Number of children: Not on file   Years of education: Not on file   Highest education level: Some college, no degree  Occupational History   Not on file  Tobacco Use   Smoking status: Some Days    Types: Cigars   Smokeless tobacco: Never   Tobacco comments:    2 black and milds daily.   Vaping Use   Vaping status: Never Used  Substance and Sexual Activity   Alcohol use: Yes    Alcohol/week: 3.0 standard drinks of alcohol    Types: 3 Standard drinks or equivalent per week    Comment: occasional use   Drug use: Yes    Types: Marijuana   Sexual activity: Yes    Partners: Female    Birth control/protection: Condom  Other Topics Concern   Not on file  Social History Narrative   Not on file   Social Drivers of Health   Tobacco Use:  High Risk (05/09/2024)   Patient History    Smoking Tobacco Use: Some Days    Smokeless Tobacco Use: Never    Passive Exposure: Not on file  Financial Resource Strain: Medium Risk (02/25/2024)   Overall Financial Resource Strain (CARDIA)    Difficulty of Paying Living Expenses: Somewhat hard  Food Insecurity: Food Insecurity Present (02/25/2024)   Epic    Worried About Programme Researcher, Broadcasting/film/video in the Last Year: Never true    Ran Out of Food in the Last Year: Sometimes true  Transportation Needs: No Transportation Needs (02/25/2024)   Epic    Lack of Transportation (Medical): No    Lack of Transportation (Non-Medical): No  Physical Activity: Sufficiently Active (02/25/2024)   Exercise Vital Sign    Days of Exercise per Week: 3 days    Minutes of Exercise per Session: 60 min  Stress: Stress Concern Present (02/25/2024)   [employer] of Occupational Health - Occupational Stress Questionnaire    Feeling of Stress: To some extent  Social Connections: Moderately Isolated (02/25/2024)   Social Connection and Isolation Panel    Frequency of Communication with Friends and Family: Three times a week    Frequency of Social Gatherings with Friends and Family: Twice a  week    Attends Religious Services: 1 to 4 times per year    Active Member of Clubs or Organizations: No    Attends Banker Meetings: Not on file    Marital Status: Never married  Intimate Partner Violence: Not At Risk (05/24/2023)   Humiliation, Afraid, Rape, and Kick questionnaire    Fear of Current or Ex-Partner: No    Emotionally Abused: No    Physically Abused: No    Sexually Abused: No  Depression (PHQ2-9): Low Risk (05/09/2024)   Depression (PHQ2-9)    PHQ-2 Score: 0  Alcohol Screen: Medium Risk (02/25/2024)   Alcohol Screen    Last Alcohol Screening Score (AUDIT): 8  Housing: High Risk (02/25/2024)   Epic    Unable to Pay for Housing in the Last Year: Yes    Number of Times Moved in the Last Year: 2     Homeless in the Last Year: Yes  Utilities: Not At Risk (02/23/2022)   [facility] Utilities    Threatened with loss of utilities: No  Health Literacy: Not on file   Past Surgical History:  Procedure Laterality Date   ESOPHAGOGASTRODUODENOSCOPY     no past surgeries     Family History  Problem Relation Age of Onset   Cancer Father    Diabetes Father    Heart attack Other    Current Medications[1] Outpatient Encounter Medications as of 05/09/2024  Medication Sig   cetirizine  (ZYRTEC ) 10 MG tablet Take 1 tablet (10 mg total) by mouth daily.   pantoprazole  (PROTONIX ) 40 MG tablet Take 1 tablet (40 mg total) by mouth daily.Courtesy refill given, please keep appointment to receive additional refills.   No facility-administered encounter medications on file as of 05/09/2024.    Allergies[2]   ROS: Review of Systems A comprehensive review of systems was negative.    Physical exam BP 121/79   Pulse 76   Resp 16   Ht 6' 5 (1.956 m)   Wt (!) 331 lb (150.1 kg)   SpO2 99%   BMI 39.25 kg/m  General appearance: alert, cooperative, appears stated age, and morbidly obese Head: Normocephalic, without obvious abnormality, atraumatic Eyes: conjunctivae/corneas clear. PERRL, EOM's intact. Fundi benign. Ears: normal TM's and external ear canals both ears Nose: Nares normal. Septum midline. Mucosa normal. No drainage or sinus tenderness. Neck: no adenopathy, no carotid bruit, no JVD, supple, symmetrical, trachea midline, and thyroid not enlarged, symmetric, no tenderness/mass/nodules Back: symmetric, no curvature. ROM normal. No CVA tenderness. Lungs: clear to auscultation bilaterally Heart: regular rate and rhythm, S1, S2 normal, no murmur, click, rub or gallop Abdomen: soft, non-tender; bowel sounds normal; no masses,  no organomegaly Extremities: extremities normal, atraumatic, no cyanosis or edema Pulses: 2+ and symmetric Skin: Skin color, texture, turgor normal. No rashes or lesions     Assessment/ Plan: Preston [name] here for annual physical exam.  Preston [name] was seen today for annual exam.  Diagnoses and all orders for this visit:  Annual physical exam  Mixed hyperlipidemia -     CMP14+EGFR -     Lipid Panel  Prediabetes -     CBC with Differential -     CMP14+EGFR -     Hemoglobin A1c  Morbid obesity (HCC) Lifts weight eating more protein increasing muscle  Discussed diet and exercise for person with BMI >25. Instructed: You must burn more calories than you eat. Losing 5 percent of your body weight should be considered a success. In the longer term,  losing more than 15 percent of your body weight and staying at this weight is an extremely good result. However, keep in mind that even losing 5 percent of your body weight leads to important health benefits, so try not to get discouraged if you're not able to lose more than this. Will recheck weight in 3-6 months.       Counseled on healthy lifestyle choices, including diet (rich in fruits, vegetables and lean meats and low in salt and simple carbohydrates) and exercise (at least 30 minutes of moderate physical activity daily).  Patient to follow up in 1 year for annual exam or sooner if needed.  The above assessment and management plan was discussed with the patient. The patient verbalized understanding of and has agreed to the management plan. Patient is aware to call the clinic if symptoms persist or worsen. Patient is aware when to return to the clinic for a follow-up visit. Patient educated on when it is appropriate to go to the emergency department.   This note has been created with Education officer, environmental. Any transcriptional errors are unintentional.   Preston SHAUNNA Bohr, NP 05/09/2024, 2:52 PM     [1]  Current Outpatient Medications:    cetirizine  (ZYRTEC ) 10 MG tablet, Take 1 tablet (10 mg total) by mouth daily., Disp: 90 tablet, Rfl: 0   pantoprazole   (PROTONIX ) 40 MG tablet, Take 1 tablet (40 mg total) by mouth daily.Courtesy refill given, please keep appointment to receive additional refills., Disp: 90 tablet, Rfl: 0 [2]  Allergies Allergen Reactions   Other Swelling and Other (See Comments)    Gi cocktail it makes me feel like my throat is closing up   Red Dye #40 (Allura Red) Hives and Itching   Yellow Dyes 6, 10, And 11 Hives and Itching   "

## 2024-05-10 LAB — CBC WITH DIFFERENTIAL/PLATELET
Basophils Absolute: 0.1 x10E3/uL (ref 0.0–0.2)
Basos: 1 %
EOS (ABSOLUTE): 0.3 x10E3/uL (ref 0.0–0.4)
Eos: 3 %
Hematocrit: 44.7 % (ref 37.5–51.0)
Hemoglobin: 15.2 g/dL (ref 13.0–17.7)
Immature Grans (Abs): 0 x10E3/uL (ref 0.0–0.1)
Immature Granulocytes: 0 %
Lymphocytes Absolute: 2.8 x10E3/uL (ref 0.7–3.1)
Lymphs: 33 %
MCH: 29.4 pg (ref 26.6–33.0)
MCHC: 34 g/dL (ref 31.5–35.7)
MCV: 87 fL (ref 79–97)
Monocytes Absolute: 0.7 x10E3/uL (ref 0.1–0.9)
Monocytes: 8 %
Neutrophils Absolute: 4.7 x10E3/uL (ref 1.4–7.0)
Neutrophils: 55 %
Platelets: 364 x10E3/uL (ref 150–450)
RBC: 5.17 x10E6/uL (ref 4.14–5.80)
RDW: 13.4 % (ref 11.6–15.4)
WBC: 8.5 x10E3/uL (ref 3.4–10.8)

## 2024-05-10 LAB — CMP14+EGFR
ALT: 25 IU/L (ref 0–44)
AST: 18 IU/L (ref 0–40)
Albumin: 4.7 g/dL (ref 4.1–5.1)
Alkaline Phosphatase: 79 IU/L (ref 47–123)
BUN/Creatinine Ratio: 13 (ref 9–20)
BUN: 14 mg/dL (ref 6–24)
Bilirubin Total: 0.3 mg/dL (ref 0.0–1.2)
CO2: 24 mmol/L (ref 20–29)
Calcium: 10.1 mg/dL (ref 8.7–10.2)
Chloride: 101 mmol/L (ref 96–106)
Creatinine, Ser: 1.05 mg/dL (ref 0.76–1.27)
Globulin, Total: 3.1 g/dL (ref 1.5–4.5)
Glucose: 101 mg/dL — ABNORMAL HIGH (ref 70–99)
Potassium: 4.9 mmol/L (ref 3.5–5.2)
Sodium: 138 mmol/L (ref 134–144)
Total Protein: 7.8 g/dL (ref 6.0–8.5)
eGFR: 91 mL/min/1.73

## 2024-05-10 LAB — LIPID PANEL
Chol/HDL Ratio: 5.1 ratio — ABNORMAL HIGH (ref 0.0–5.0)
Cholesterol, Total: 265 mg/dL — ABNORMAL HIGH (ref 100–199)
HDL: 52 mg/dL
LDL Chol Calc (NIH): 193 mg/dL — ABNORMAL HIGH (ref 0–99)
Triglycerides: 110 mg/dL (ref 0–149)
VLDL Cholesterol Cal: 20 mg/dL (ref 5–40)

## 2024-05-10 LAB — HEMOGLOBIN A1C
Est. average glucose Bld gHb Est-mCnc: 137 mg/dL
Hgb A1c MFr Bld: 6.4 % — ABNORMAL HIGH (ref 4.8–5.6)

## 2024-05-14 ENCOUNTER — Ambulatory Visit (INDEPENDENT_AMBULATORY_CARE_PROVIDER_SITE_OTHER): Payer: Self-pay | Admitting: Primary Care

## 2024-05-28 ENCOUNTER — Ambulatory Visit (HOSPITAL_COMMUNITY): Admitting: Licensed Clinical Social Worker

## 2024-08-12 ENCOUNTER — Ambulatory Visit (INDEPENDENT_AMBULATORY_CARE_PROVIDER_SITE_OTHER): Payer: Self-pay | Admitting: Primary Care
